# Patient Record
Sex: Female | Born: 1950
Health system: Southern US, Community
[De-identification: ages and names within clinical notes are randomized; demographics above are authoritative.]

## PROBLEM LIST (undated history)

## (undated) DIAGNOSIS — K2210 Ulcer of esophagus without bleeding: Secondary | ICD-10-CM

## (undated) DIAGNOSIS — M8580 Other specified disorders of bone density and structure, unspecified site: Secondary | ICD-10-CM

## (undated) DIAGNOSIS — Z8719 Personal history of other diseases of the digestive system: Secondary | ICD-10-CM

## (undated) DIAGNOSIS — K623 Rectal prolapse: Secondary | ICD-10-CM

## (undated) DIAGNOSIS — K2270 Barrett's esophagus without dysplasia: Secondary | ICD-10-CM

## (undated) DIAGNOSIS — K649 Unspecified hemorrhoids: Secondary | ICD-10-CM

## (undated) DIAGNOSIS — R112 Nausea with vomiting, unspecified: Secondary | ICD-10-CM

## (undated) DIAGNOSIS — K219 Gastro-esophageal reflux disease without esophagitis: Secondary | ICD-10-CM

## (undated) DIAGNOSIS — G4730 Sleep apnea, unspecified: Secondary | ICD-10-CM

## (undated) DIAGNOSIS — G4700 Insomnia, unspecified: Secondary | ICD-10-CM

## (undated) DIAGNOSIS — K635 Polyp of colon: Secondary | ICD-10-CM

## (undated) DIAGNOSIS — K648 Other hemorrhoids: Secondary | ICD-10-CM

## (undated) DIAGNOSIS — K2970 Gastritis, unspecified, without bleeding: Secondary | ICD-10-CM

## (undated) DIAGNOSIS — H269 Unspecified cataract: Secondary | ICD-10-CM

## (undated) DIAGNOSIS — Z9889 Other specified postprocedural states: Secondary | ICD-10-CM

## (undated) DIAGNOSIS — Z5189 Encounter for other specified aftercare: Secondary | ICD-10-CM

## (undated) DIAGNOSIS — Z87442 Personal history of urinary calculi: Secondary | ICD-10-CM

## (undated) HISTORY — PX: CATARACT EXTRACTION: SUR2

## (undated) HISTORY — PX: ESOPHAGOGASTRODUODENOSCOPY: SHX1529

## (undated) HISTORY — DX: Sleep apnea, unspecified: G47.30

## (undated) HISTORY — PX: COLONOSCOPY: SHX174

## (undated) HISTORY — DX: Unspecified cataract: H26.9

## (undated) HISTORY — DX: Barrett's esophagus without dysplasia: K22.70

## (undated) HISTORY — DX: Insomnia, unspecified: G47.00

## (undated) HISTORY — DX: Encounter for other specified aftercare: Z51.89

## (undated) HISTORY — DX: Rectal prolapse: K62.3

## (undated) HISTORY — DX: Ulcer of esophagus without bleeding: K22.10

## (undated) HISTORY — DX: Other specified disorders of bone density and structure, unspecified site: M85.80

## (undated) HISTORY — DX: Other hemorrhoids: K64.8

## (undated) HISTORY — DX: Gastritis, unspecified, without bleeding: K29.70

## (undated) HISTORY — PX: COLONOSCOPY W/ BIOPSIES: SHX1374

## (undated) HISTORY — PX: SKIN BIOPSY: SHX1

## (undated) HISTORY — DX: Polyp of colon: K63.5

## (undated) HISTORY — DX: Unspecified hemorrhoids: K64.9

---

## 1982-03-08 HISTORY — PX: ABDOMINAL HYSTERECTOMY: SHX81

## 1998-07-18 ENCOUNTER — Encounter: Payer: Self-pay | Admitting: Emergency Medicine

## 1998-07-18 ENCOUNTER — Emergency Department (HOSPITAL_COMMUNITY): Admission: EM | Admit: 1998-07-18 | Discharge: 1998-07-18 | Payer: Self-pay | Admitting: Emergency Medicine

## 1998-11-05 ENCOUNTER — Emergency Department (HOSPITAL_COMMUNITY): Admission: EM | Admit: 1998-11-05 | Discharge: 1998-11-05 | Payer: Self-pay | Admitting: Emergency Medicine

## 1998-11-06 ENCOUNTER — Encounter: Payer: Self-pay | Admitting: Emergency Medicine

## 1998-11-27 ENCOUNTER — Ambulatory Visit (HOSPITAL_COMMUNITY): Admission: RE | Admit: 1998-11-27 | Discharge: 1998-11-27 | Payer: Self-pay | Admitting: *Deleted

## 1999-02-22 ENCOUNTER — Emergency Department (HOSPITAL_COMMUNITY): Admission: EM | Admit: 1999-02-22 | Discharge: 1999-02-22 | Payer: Self-pay

## 1999-02-23 ENCOUNTER — Emergency Department (HOSPITAL_COMMUNITY): Admission: EM | Admit: 1999-02-23 | Discharge: 1999-02-23 | Payer: Self-pay | Admitting: Emergency Medicine

## 1999-02-23 ENCOUNTER — Encounter: Payer: Self-pay | Admitting: Emergency Medicine

## 1999-03-10 ENCOUNTER — Encounter: Payer: Self-pay | Admitting: *Deleted

## 1999-03-10 ENCOUNTER — Encounter: Admission: RE | Admit: 1999-03-10 | Discharge: 1999-03-10 | Payer: Self-pay | Admitting: *Deleted

## 1999-03-18 ENCOUNTER — Encounter: Admission: RE | Admit: 1999-03-18 | Discharge: 1999-03-18 | Payer: Self-pay | Admitting: *Deleted

## 1999-03-18 ENCOUNTER — Encounter: Payer: Self-pay | Admitting: *Deleted

## 1999-07-23 ENCOUNTER — Encounter: Payer: Self-pay | Admitting: Emergency Medicine

## 1999-07-23 ENCOUNTER — Emergency Department (HOSPITAL_COMMUNITY): Admission: EM | Admit: 1999-07-23 | Discharge: 1999-07-23 | Payer: Self-pay | Admitting: Emergency Medicine

## 1999-07-27 ENCOUNTER — Emergency Department (HOSPITAL_COMMUNITY): Admission: EM | Admit: 1999-07-27 | Discharge: 1999-07-27 | Payer: Self-pay | Admitting: Emergency Medicine

## 1999-09-22 ENCOUNTER — Encounter: Admission: RE | Admit: 1999-09-22 | Discharge: 1999-09-22 | Payer: Self-pay | Admitting: *Deleted

## 1999-09-22 ENCOUNTER — Encounter: Payer: Self-pay | Admitting: *Deleted

## 2000-02-02 ENCOUNTER — Emergency Department (HOSPITAL_COMMUNITY): Admission: EM | Admit: 2000-02-02 | Discharge: 2000-02-02 | Payer: Self-pay | Admitting: Emergency Medicine

## 2000-05-21 ENCOUNTER — Emergency Department (HOSPITAL_COMMUNITY): Admission: EM | Admit: 2000-05-21 | Discharge: 2000-05-21 | Payer: Self-pay | Admitting: Emergency Medicine

## 2000-12-21 ENCOUNTER — Emergency Department (HOSPITAL_COMMUNITY): Admission: EM | Admit: 2000-12-21 | Discharge: 2000-12-21 | Payer: Self-pay | Admitting: Emergency Medicine

## 2000-12-21 ENCOUNTER — Encounter: Payer: Self-pay | Admitting: Emergency Medicine

## 2002-02-27 ENCOUNTER — Encounter: Payer: Self-pay | Admitting: Internal Medicine

## 2002-02-27 ENCOUNTER — Encounter: Admission: RE | Admit: 2002-02-27 | Discharge: 2002-02-27 | Payer: Self-pay | Admitting: Internal Medicine

## 2002-06-26 ENCOUNTER — Emergency Department (HOSPITAL_COMMUNITY): Admission: EM | Admit: 2002-06-26 | Discharge: 2002-06-26 | Payer: Self-pay | Admitting: Emergency Medicine

## 2002-07-18 ENCOUNTER — Encounter (INDEPENDENT_AMBULATORY_CARE_PROVIDER_SITE_OTHER): Payer: Self-pay | Admitting: Specialist

## 2002-07-18 ENCOUNTER — Ambulatory Visit (HOSPITAL_COMMUNITY): Admission: RE | Admit: 2002-07-18 | Discharge: 2002-07-18 | Payer: Self-pay | Admitting: *Deleted

## 2002-07-20 ENCOUNTER — Encounter: Payer: Self-pay | Admitting: Internal Medicine

## 2002-07-20 ENCOUNTER — Encounter: Admission: RE | Admit: 2002-07-20 | Discharge: 2002-07-20 | Payer: Self-pay | Admitting: Internal Medicine

## 2002-11-16 ENCOUNTER — Encounter: Admission: RE | Admit: 2002-11-16 | Discharge: 2002-11-16 | Payer: Self-pay | Admitting: Internal Medicine

## 2002-11-16 ENCOUNTER — Encounter: Payer: Self-pay | Admitting: Internal Medicine

## 2003-08-16 ENCOUNTER — Encounter (INDEPENDENT_AMBULATORY_CARE_PROVIDER_SITE_OTHER): Payer: Self-pay | Admitting: Specialist

## 2003-08-16 ENCOUNTER — Ambulatory Visit (HOSPITAL_COMMUNITY): Admission: RE | Admit: 2003-08-16 | Discharge: 2003-08-16 | Payer: Self-pay | Admitting: *Deleted

## 2004-07-18 ENCOUNTER — Encounter: Admission: RE | Admit: 2004-07-18 | Discharge: 2004-07-18 | Payer: Self-pay | Admitting: Internal Medicine

## 2005-07-06 ENCOUNTER — Encounter (INDEPENDENT_AMBULATORY_CARE_PROVIDER_SITE_OTHER): Payer: Self-pay | Admitting: *Deleted

## 2005-07-06 ENCOUNTER — Ambulatory Visit (HOSPITAL_COMMUNITY): Admission: RE | Admit: 2005-07-06 | Discharge: 2005-07-06 | Payer: Self-pay | Admitting: *Deleted

## 2005-08-16 ENCOUNTER — Encounter: Admission: RE | Admit: 2005-08-16 | Discharge: 2005-08-16 | Payer: Self-pay | Admitting: Family Medicine

## 2005-08-18 ENCOUNTER — Ambulatory Visit: Payer: Self-pay | Admitting: Family Medicine

## 2005-08-24 ENCOUNTER — Encounter: Admission: RE | Admit: 2005-08-24 | Discharge: 2005-08-24 | Payer: Self-pay | Admitting: Family Medicine

## 2005-09-23 ENCOUNTER — Ambulatory Visit: Payer: Self-pay | Admitting: Family Medicine

## 2005-11-11 ENCOUNTER — Ambulatory Visit: Payer: Self-pay | Admitting: Family Medicine

## 2005-12-29 ENCOUNTER — Ambulatory Visit: Payer: Self-pay | Admitting: Family Medicine

## 2006-03-03 ENCOUNTER — Ambulatory Visit: Payer: Self-pay | Admitting: Family Medicine

## 2006-03-09 ENCOUNTER — Ambulatory Visit: Payer: Self-pay | Admitting: Family Medicine

## 2006-03-30 ENCOUNTER — Ambulatory Visit: Payer: Self-pay | Admitting: Family Medicine

## 2006-05-26 ENCOUNTER — Ambulatory Visit: Payer: Self-pay | Admitting: Family Medicine

## 2006-07-01 ENCOUNTER — Other Ambulatory Visit: Admission: RE | Admit: 2006-07-01 | Discharge: 2006-07-01 | Payer: Self-pay | Admitting: Family Medicine

## 2006-07-01 ENCOUNTER — Ambulatory Visit: Payer: Self-pay | Admitting: Family Medicine

## 2006-07-29 ENCOUNTER — Ambulatory Visit: Payer: Self-pay | Admitting: Family Medicine

## 2006-08-03 ENCOUNTER — Ambulatory Visit: Payer: Self-pay | Admitting: Family Medicine

## 2006-08-05 ENCOUNTER — Encounter: Admission: RE | Admit: 2006-08-05 | Discharge: 2006-08-05 | Payer: Self-pay | Admitting: Family Medicine

## 2006-08-19 ENCOUNTER — Encounter: Admission: RE | Admit: 2006-08-19 | Discharge: 2006-08-19 | Payer: Self-pay | Admitting: Family Medicine

## 2006-09-13 ENCOUNTER — Ambulatory Visit: Payer: Self-pay | Admitting: Family Medicine

## 2007-01-06 ENCOUNTER — Ambulatory Visit: Payer: Self-pay | Admitting: Family Medicine

## 2007-05-12 ENCOUNTER — Ambulatory Visit: Payer: Self-pay | Admitting: Family Medicine

## 2007-07-06 ENCOUNTER — Other Ambulatory Visit: Admission: RE | Admit: 2007-07-06 | Discharge: 2007-07-06 | Payer: Self-pay | Admitting: Family Medicine

## 2007-07-06 ENCOUNTER — Ambulatory Visit: Payer: Self-pay | Admitting: Family Medicine

## 2007-07-12 ENCOUNTER — Ambulatory Visit: Payer: Self-pay | Admitting: Family Medicine

## 2007-08-21 ENCOUNTER — Encounter: Admission: RE | Admit: 2007-08-21 | Discharge: 2007-08-21 | Payer: Self-pay | Admitting: Family Medicine

## 2007-11-27 ENCOUNTER — Ambulatory Visit: Payer: Self-pay | Admitting: Family Medicine

## 2008-01-26 ENCOUNTER — Ambulatory Visit: Payer: Self-pay | Admitting: Family Medicine

## 2008-02-27 ENCOUNTER — Ambulatory Visit: Payer: Self-pay | Admitting: Family Medicine

## 2008-03-08 HISTORY — PX: CHOLECYSTECTOMY: SHX55

## 2008-04-29 ENCOUNTER — Ambulatory Visit: Payer: Self-pay | Admitting: Family Medicine

## 2008-04-30 ENCOUNTER — Ambulatory Visit (HOSPITAL_COMMUNITY): Admission: RE | Admit: 2008-04-30 | Discharge: 2008-04-30 | Payer: Self-pay | Admitting: Family Medicine

## 2008-05-09 ENCOUNTER — Ambulatory Visit (HOSPITAL_COMMUNITY): Admission: RE | Admit: 2008-05-09 | Discharge: 2008-05-09 | Payer: Self-pay | Admitting: Surgery

## 2008-05-09 ENCOUNTER — Encounter (INDEPENDENT_AMBULATORY_CARE_PROVIDER_SITE_OTHER): Payer: Self-pay | Admitting: Surgery

## 2008-07-09 ENCOUNTER — Ambulatory Visit: Payer: Self-pay | Admitting: Family Medicine

## 2008-08-14 ENCOUNTER — Encounter (INDEPENDENT_AMBULATORY_CARE_PROVIDER_SITE_OTHER): Payer: Self-pay | Admitting: *Deleted

## 2008-08-14 ENCOUNTER — Ambulatory Visit (HOSPITAL_COMMUNITY): Admission: RE | Admit: 2008-08-14 | Discharge: 2008-08-14 | Payer: Self-pay | Admitting: *Deleted

## 2008-11-08 ENCOUNTER — Ambulatory Visit: Payer: Self-pay | Admitting: Family Medicine

## 2008-11-15 ENCOUNTER — Ambulatory Visit: Payer: Self-pay | Admitting: Family Medicine

## 2008-12-12 ENCOUNTER — Ambulatory Visit: Payer: Self-pay | Admitting: Family Medicine

## 2009-01-27 ENCOUNTER — Ambulatory Visit: Payer: Self-pay | Admitting: Family Medicine

## 2009-07-17 ENCOUNTER — Ambulatory Visit: Payer: Self-pay | Admitting: Physician Assistant

## 2009-07-21 ENCOUNTER — Encounter: Admission: RE | Admit: 2009-07-21 | Discharge: 2009-07-21 | Payer: Self-pay | Admitting: Family Medicine

## 2009-08-28 ENCOUNTER — Ambulatory Visit: Payer: Self-pay | Admitting: Family Medicine

## 2009-09-05 ENCOUNTER — Ambulatory Visit: Payer: Self-pay | Admitting: Family Medicine

## 2009-10-08 DIAGNOSIS — K2210 Ulcer of esophagus without bleeding: Secondary | ICD-10-CM

## 2009-10-08 HISTORY — DX: Ulcer of esophagus without bleeding: K22.10

## 2009-11-28 ENCOUNTER — Ambulatory Visit: Payer: Self-pay | Admitting: Family Medicine

## 2010-03-29 ENCOUNTER — Encounter: Payer: Self-pay | Admitting: Family Medicine

## 2010-04-13 ENCOUNTER — Ambulatory Visit (INDEPENDENT_AMBULATORY_CARE_PROVIDER_SITE_OTHER): Payer: BC Managed Care – PPO | Admitting: Family Medicine

## 2010-04-13 DIAGNOSIS — T148XXA Other injury of unspecified body region, initial encounter: Secondary | ICD-10-CM

## 2010-04-17 ENCOUNTER — Ambulatory Visit: Payer: BC Managed Care – PPO | Admitting: Family Medicine

## 2010-04-17 ENCOUNTER — Ambulatory Visit (INDEPENDENT_AMBULATORY_CARE_PROVIDER_SITE_OTHER): Payer: BC Managed Care – PPO | Admitting: Family Medicine

## 2010-04-17 DIAGNOSIS — N390 Urinary tract infection, site not specified: Secondary | ICD-10-CM

## 2010-04-17 DIAGNOSIS — H9209 Otalgia, unspecified ear: Secondary | ICD-10-CM

## 2010-06-18 LAB — HEMOGLOBIN AND HEMATOCRIT, BLOOD: HCT: 41.8 % (ref 36.0–46.0)

## 2010-06-22 ENCOUNTER — Other Ambulatory Visit: Payer: Self-pay | Admitting: Family Medicine

## 2010-06-22 DIAGNOSIS — Z1231 Encounter for screening mammogram for malignant neoplasm of breast: Secondary | ICD-10-CM

## 2010-07-06 ENCOUNTER — Encounter: Payer: Self-pay | Admitting: Family Medicine

## 2010-07-06 DIAGNOSIS — IMO0002 Reserved for concepts with insufficient information to code with codable children: Secondary | ICD-10-CM

## 2010-07-06 DIAGNOSIS — M546 Pain in thoracic spine: Secondary | ICD-10-CM

## 2010-07-08 ENCOUNTER — Ambulatory Visit (INDEPENDENT_AMBULATORY_CARE_PROVIDER_SITE_OTHER): Payer: BC Managed Care – PPO | Admitting: Medical

## 2010-07-08 DIAGNOSIS — J029 Acute pharyngitis, unspecified: Secondary | ICD-10-CM

## 2010-07-08 DIAGNOSIS — G4700 Insomnia, unspecified: Secondary | ICD-10-CM

## 2010-07-08 DIAGNOSIS — J069 Acute upper respiratory infection, unspecified: Secondary | ICD-10-CM

## 2010-07-21 NOTE — Op Note (Signed)
NAME:  Anna Mckinney, Anna Mckinney NO.:  [id number]   MEDICAL RECORD NO.:  [id number]          PATIENT TYPE:  AMB   LOCATION:  DAY                          FACILITY:  [facility]   PHYSICIAN:  Ardeth Sportsman, MD     DATE OF BIRTH:  07-20-1950   DATE OF PROCEDURE:  DATE OF DISCHARGE:                               OPERATIVE REPORT   PRIMARY CARE PHYSICIAN:  Sharlot Gowda, M.D.   GASTROENTEROLOGIST:  Georgiana Spinner, M.D.   SURGEON:  Ardeth Sportsman, MD.   ASSISTANT:  None.   PREOPERATIVE DIAGNOSES:  Biliary colic with probable cholecystitis.   POSTOPERATIVE DIAGNOSIS:  1. Cholecystolithiasis.  2. Chronic cholecystitis.   PROCEDURE PERFORMED:  Left upper cholecystectomy with intraoperative  cholangiogram.   ANESTHESIA:  1. General anesthesia.  2. Local anesthetic in a field block around port sites.   SPECIMENS:  Gallbladder.   DRAINS:  None.   ESTIMATED BLOOD LOSS:  Less than 10 mL.   COMPLICATIONS:  None apparent.   INDICATIONS:  [name] is a pleasant but anxious 60 year old female  with a lot of stress in her life taking care of her high-needs daughter  and family.  She has had a history of biliary colic.  We have seen her  in the past and recommended cholecystectomy, but she had delayed on this  by trying to do diet modification.  However, she is having worsening  symptoms.  Based on evaluation, she was known to have a known stone, had  a classic story for biliary colic.   The anatomy and physiology of hepatobiliary pancreatic function was  discussed.  The pathophysiology of cholecystolithiasis with its risks of  cholecystitis, gallstone pancreatitis, choledocholithiasis and other  natural history risks were discussed.  Options were discussed and  recommendation was made for a laparoscopic cholecystectomy with an  intraoperative cholangiogram and diagnostic laparoscopy.  The risks,  benefits, and alternatives were discussed.  Questions were answered.  She  agreed to proceed.   OPERATIVE FINDINGS:  She had some mild gallbladder wall thickening with  a few adhesions of omentum and duodenum consistent with chronic  cholecystitis.  She definitely had stones.   Her cholangiogram noted cystic cannulization and her right intrahepatic  biliary chains filled well but her left were rather on slow to fill but  ultimately did fill with a repeat cholangiogram.  There was no evidence  of any cirrhosis or other liver abnormalities.   DESCRIPTION OF PROCEDURE:  Informed consent was confirmed.  The patient  voided just prior to coming to the operating room.  She had sequential  compression devices active during the entire case.   She was a challenging intubation but ultimately with a guide, they were  able to intubate her without much incident.  Decadron was given for  nausea prophylaxis as well as airway edema prophylaxis.  She was supine  with both arms tucked.  Her abdomen was prepped and draped in a sterile  fashion.   A 5-mm port was placed in the right upper quadrant using optical entry  technique with the patient in steep  reverse Trendelenburg and right side  up.  Camera inspection revealed no intra-abdominal injury.  Under direct  visualization, a 5-mm port was placed through her prior infraumbilical  incision.  Another one was placed in the right flank.  A 10-mm port was  tunneled through the falciform ligament and subxiphoid region at an  angle.   The gallbladder fundus was grasped and elevated cephalad.  Omental and  duodenal adhesions were carefully swept away using primarily focused  cautery as well as some blunt dissection, especially around the  duodenum.  Peritoneal coverings between the liver and the anteromedial  and posterolateral walls of the gallbladder were done.  Circumferential  dissection was done around the base of the gallbladder to free the  proximal third of the gallbladder off the liver bed to get a good  classic  critical view.  Dissection was done to reveal 3 structures going  from the gallbladder fundus down to the porta hepatis.  One was  pulsatile, on the anteromedial wall, consistent with the dominant  anterior branch of the cystic artery.  One clip on the gallbladder side,  2 clips slightly proximal were made, and this was transected.  A  posterior branch was found about a third of the way up the  posterolateral wall and this was carefully skeletonized, isolated, and  clipped as well.   This left one structure going from the gallbladder down to the porta  hepatis, consistent with the cystic duct.  It was further skeletonized  to get some good length of several centimeters.  Two clips on the  infundibulum were placed.  A partial cystic ductotomy was performed.  Some sand was milked back up from the cystic duct and common bile duct  junction back up to the ductotomy.  A 5-French cholangiogram catheter  was placed through a right subcostal stab incision, flushed, and placed  in the cystic duct without any difficulty.   A cholangiogram was run using dilute radio-opaque contrast and  continuous fluoroscopy.  Contrast flowed well from a side branch in a  helical form consistent with cystic duct cannulization.  Contrast  refluxed well into the right intrahepatic chains, across the common  hepatic bile ducts, crossing normal ampulla into the duodenum without  any difficulty.  The left side was not seen very well.  I did a rerun a  cholangiogram, with some more aggressive flushing, and could get some  contrast to reflux up the left biliary radicles that seemed to be of  normal caliber.  The cholangiocatheter was removed.  Four clips were  placed in the cystic duct just proximal to the ductotomy.  Since I had a  decent length, cystic duct transection was completed right at the  infundibulum.   The gallbladder was freed from its remaining attachments on the liver  bed and removed out the subxiphoid  port.  During the removal, there was  some spillage of stones in the subxiphoid wound.  500 mL of irrigation  was done of the subxiphoid wound, with clear return.  The fascial defect  was reapproximated using a 0 Vicryl stitch in a figure-of-eight fashion  using a laparoscopic suture passer under direct visualization.   Hemostasis was excellent on the liver bed.  The clips were intact on the  cystic duct branch stumps and the cystic artery branch stumps.  Copious  irrigation was done with over 1.5 liters, with clear return at the end.  Capnoperitoneum was evacuated.  The ports were removed.  The  fascial  stitch was tied down, with good closure.  The skin was closed using 4-0  Monocryl stitch.   The patient was extubated and sent to the recovery room in stable  condition.   I had discussed postop care with the patient in detail in the office and  just prior to surgery.  I discussed it with her husband per her wishes  as well.      Ardeth Sportsman, MD  Electronically Signed     SCG/MEDQ  D:  05/09/2008  T:  05/09/2008  Job:  [id number]   cc:   Sharlot Gowda, M.D.  Fax: [phone]   Georgiana Spinner, M.D.  Fax: [phone]

## 2010-07-21 NOTE — Op Note (Signed)
NAME:  Anna Mckinney, Anna Mckinney               ACCOUNT NO.:  [id number]   MEDICAL RECORD NO.:  [id number]          PATIENT TYPE:  AMB   LOCATION:  ENDO                         FACILITY:  [facility]   PHYSICIAN:  Georgiana Spinner, M.D.    DATE OF BIRTH:  Aug 11, 1950   DATE OF PROCEDURE:  08/14/2008  DATE OF DISCHARGE:                               OPERATIVE REPORT   PROCEDURE:  Upper endoscopy with biopsy.   INDICATIONS:  Abdominal pain.   ANESTHESIA:  Fentanyl 50 mcg, Versed 5 mg.   PROCEDURE:  With the patient mildly sedated in the left lateral  decubitus position, the Pentax videoscopic endoscope was inserted into  the mouth, passed under direct vision through the esophagus, which  appeared normal until we reached the distal esophagus and there was an  ulcer seen near the squamocolumnar junction.  This was photographed and  biopsied.  We entered into the stomach.  Fundus, body, antrum, duodenal  bulb, second portion duodenum were visualized.  From this point the  endoscope was slowly withdrawn, taking circumferential views of duodenal  mucosa until the endoscope had been pulled back into stomach, placed in  retroflexion to view the stomach from below.  The endoscope was then  straightened and withdrawn, taking circumferential views of remaining  gastric and esophageal mucosa, stopping once again in the distal  esophagus where there was a change of a [address] of Barrett's, which  was photographed and biopsied as well.  The endoscope was withdrawn.  The patient's vital signs, pulse oximeter remained stable.  The patient  tolerated procedure well, without apparent complications.   FINDINGS:  Clinical cytogeneticist of Barrett's esophagus, biopsied, and of note,  ulcer in the distal esophagus, which would correspond with the patient's  having pain.  It gets better with eating and worse when she does not  eat.  I will start the patient on PPI.  I will have the patient call me  for results of biopsies and  follow up with me as an outpatient.           ______________________________  Georgiana Spinner, M.D.     GMO/MEDQ  D:  08/14/2008  T:  08/14/2008  Job:  [id number]

## 2010-07-24 NOTE — Op Note (Signed)
NAME:  Anna Mckinney, Anna Mckinney NO.:  [id number]   MEDICAL RECORD NO.:  [id number]                   PATIENT TYPE:  AMB   LOCATION:  ENDO                                 FACILITY:  [facility]   PHYSICIAN:  Georgiana Spinner, M.D.                 DATE OF BIRTH:  1950-10-20   DATE OF PROCEDURE:  08/16/2003  DATE OF DISCHARGE:                                 OPERATIVE REPORT   PROCEDURE:  Upper endoscopy with biopsy.   INDICATIONS:  Barrett's esophagus by biopsy in the past.   ANESTHESIA:  1. Demerol 90.  2. Versed 9 mg.   DESCRIPTION OF PROCEDURE:  With patient mildly sedated in the left lateral  decubitus position, the Olympus videoscopic endoscope was inserted in the  mouth, passed under direct vision through the esophagus, which appeared  normal until we reached the distal esophagus, and there were islands of  Barrett's tissue photographed and then subsequently biopsied.  We entered  into the stomach.  Fundus, body, antrum, duodenal bulb, second portion of  duodenum were well-visualized.  From this point, the endoscope was slowly  withdrawn, taking circumferential views of the duodenal mucosa until the  endoscope then pulled back into the stomach, placed in retroflexion to view  the stomach from below.  A very lax gastroesophageal junction was noted and  photographed from below.  The endoscope was then straightened, withdrawn,  taking circumferential views of the remaining gastric and esophageal mucosa.  The patient's vital signs and pulse oximeter remained stable.  The patient  tolerated the procedure well without apparent complications.   FINDINGS:  Barrett's esophagus above a lax gastroesophageal sphincter.   PLAN:  1. Await biopsy report.  2. The patient will call me for results and follow up with me as an     outpatient.                                               Georgiana Spinner, M.D.    GMO/MEDQ  D:  08/16/2003  T:  08/16/2003  Job:  [id number]   cc:    Allena Napoleon, MD

## 2010-07-24 NOTE — Op Note (Signed)
NAME:  Anna Mckinney, Anna Mckinney               ACCOUNT NO.:  [id number]   MEDICAL RECORD NO.:  [id number]          PATIENT TYPE:  AMB   LOCATION:  ENDO                         FACILITY:  [facility]   PHYSICIAN:  Georgiana Spinner, M.D.    DATE OF BIRTH:  [birth year]   DATE OF PROCEDURE:  07/06/2005  DATE OF DISCHARGE:                                 OPERATIVE REPORT   PROCEDURE:  Upper endoscopy.   INDICATIONS:  Gastroesophageal reflux disease with known Barrett's  esophagus.   ANESTHESIA:  Demerol 60 mg, Versed 8 mg.   PROCEDURE:  With the patient mildly sedated in the left lateral decubitus  position, the Olympus videoscopic endoscope was inserted in the mouth,  passed under direct vision through the esophagus which appeared normal until  we reached the distal esophagus, and there were changes of Barrett's  photographed and subsequently biopsied.  We entered into the stomach.  Fundus, body, antrum, duodenal bulb and second portion of duodenum all  appeared normal.  From this point, the endoscope was slowly withdrawn taking  circumferential views of duodenal mucosa until the endoscope had been pulled  back in the stomach and placed in retroflexion, viewing the stomach from  below. The endoscope was straightened and withdrawn, taking circumferential  views of the remaining gastric and esophageal mucosa.  The patient's vital  signs and pulse oximeter remained stable.  The patient tolerated the  procedure well without apparent complications.   FINDINGS:  Barrett's esophagus.  Await biopsy report.  The patient will call  me for results and follow-up with me as an outpatient.           ______________________________  Georgiana Spinner, M.D.     GMO/MEDQ  D:  07/06/2005  T:  07/06/2005  Job:  [id number]   cc:   Sharlot Gowda, M.D.  Fax: [phone]

## 2010-07-24 NOTE — Op Note (Signed)
   NAME:  Anna Mckinney, CARIS NO.:  [id number]   MEDICAL RECORD NO.:  [id number]                   PATIENT TYPE:  AMB   LOCATION:  ENDO                                 FACILITY:  [facility]   PHYSICIAN:  Georgiana Spinner, M.D.                 DATE OF BIRTH:  12-12-1950   DATE OF PROCEDURE:  07/18/2002  DATE OF DISCHARGE:                                 OPERATIVE REPORT   PROCEDURE:  Upper endoscopy.   INDICATIONS:  GERD and abdominal pain.   ANESTHESIA:  Demerol 50 mg, Versed 5 mg.   DESCRIPTION OF PROCEDURE:  With the patient mildly sedated in the left  lateral decubitus position, the Olympus videoscopic endoscope was inserted  through the mouth and passed under direct vision through the esophagus.  In  the esophagus there were small shallow lengths of tissue that could have  been Barrett's esophagus which we photographed and biopsied.  We entered the  stomach.  The fundus, body, antrum, duodenal bulb and second portion of the  duodenum all appeared normal.  From this point, the endoscope was slowly  withdrawn taking circumflex views along the mucosa until the endoscope was  then pulled back into the stomach, placed in retroflexion to view the  stomach from below.  Incomplete wrap of the GE junction was seen and  photographed.  The endoscope was then straightened and withdrawn.  The  patient's vital signs and pulse oximetry remained stable.  The patient  tolerated the procedure well without apparent complication.   FINDINGS:  1. Incomplete wrap of the GE junction around the endoscope indicating some     laxity of the gastroesophageal junction.  2. Also question of short segment Barrett's esophagus, biopsied.   PLAN:  Await biopsy report.  The patient will call me for results and follow  up with me as an outpatient and proceed to colonoscopy.                                                Georgiana Spinner, M.D.    GMO/MEDQ  D:  07/18/2002  T:  07/18/2002   Job:  [id number]

## 2010-07-24 NOTE — Op Note (Signed)
   NAME:  Anna, Mckinney NO.:  [id number]   MEDICAL RECORD NO.:  [id number]                   PATIENT TYPE:   LOCATION:                                       FACILITY:  [facility]   PHYSICIAN:  Georgiana Spinner, M.D.                 DATE OF BIRTH:  22-Apr-1950   DATE OF PROCEDURE:  DATE OF DISCHARGE:                                 OPERATIVE REPORT   PROCEDURE:  Colonoscopy.   INDICATIONS:  Rectal bleeding, colon cancer screening.   ANESTHESIA:  Demerol 30, Versed 3.   DESCRIPTION OF PROCEDURE:  With the patient mildly sedated and in the left  lateral decubitus position, the Olympus videoscopic colonoscope was inserted  in the rectum and passed under direct vision to the cecum, identified by  the ileocecal valve and appendiceal orifice both of which were photographed.  From this point, the colonoscope was slowly withdrawn taking circumferential  views of the entire colonic mucosa, stopping only in the rectum which  appeared normal in the direct and retroflexed view.  The endoscope was  straightened and withdrawn.  The patient's vital signs and pulse oximetry  remained stable.  The patient tolerated the procedure well without apparent  complications.   FINDINGS:  Normal colonoscopic examination to the cecum.                                               Georgiana Spinner, M.D.    GMO/MEDQ  D:  07/18/2002  T:  07/18/2002  Job:  [id number]

## 2010-07-27 ENCOUNTER — Encounter: Payer: Self-pay | Admitting: Family Medicine

## 2010-07-27 ENCOUNTER — Other Ambulatory Visit (HOSPITAL_COMMUNITY)
Admission: RE | Admit: 2010-07-27 | Discharge: 2010-07-27 | Disposition: A | Discharge status: Home / Self Care | Payer: BC Managed Care – PPO | Source: Ambulatory Visit | Attending: Family Medicine | Admitting: Family Medicine

## 2010-07-27 ENCOUNTER — Ambulatory Visit (INDEPENDENT_AMBULATORY_CARE_PROVIDER_SITE_OTHER): Payer: BC Managed Care – PPO | Admitting: Family Medicine

## 2010-07-27 VITALS — BP 132/88 | HR 72 | Ht 64.0 in | Wt 152.0 lb

## 2010-07-27 DIAGNOSIS — J069 Acute upper respiratory infection, unspecified: Secondary | ICD-10-CM

## 2010-07-27 DIAGNOSIS — M899 Disorder of bone, unspecified: Secondary | ICD-10-CM

## 2010-07-27 DIAGNOSIS — M8580 Other specified disorders of bone density and structure, unspecified site: Secondary | ICD-10-CM | POA: Insufficient documentation

## 2010-07-27 DIAGNOSIS — E7800 Pure hypercholesterolemia, unspecified: Secondary | ICD-10-CM

## 2010-07-27 DIAGNOSIS — Z0000 Encounter for general adult medical examination without abnormal findings: Secondary | ICD-10-CM

## 2010-07-27 DIAGNOSIS — Z01419 Encounter for gynecological examination (general) (routine) without abnormal findings: Secondary | ICD-10-CM | POA: Insufficient documentation

## 2010-07-27 LAB — POCT URINALYSIS DIPSTICK
Protein, UA: NEGATIVE
pH, UA: 5

## 2010-07-27 LAB — COMPREHENSIVE METABOLIC PANEL
ALT: 20 U/L (ref 0–35)
Alkaline Phosphatase: 79 U/L (ref 39–117)
BUN: 14 mg/dL (ref 6–23)
CO2: 24 mEq/L (ref 19–32)
Calcium: 9.8 mg/dL (ref 8.4–10.5)
Sodium: 142 mEq/L (ref 135–145)

## 2010-07-27 LAB — LIPID PANEL
HDL: 33 mg/dL — ABNORMAL LOW (ref >39)
LDL Cholesterol: 144 mg/dL — ABNORMAL HIGH (ref 0–99)
Total CHOL/HDL Ratio: 6.9 Ratio
VLDL: 52 mg/dL — ABNORMAL HIGH (ref 0–40)

## 2010-07-27 LAB — HM PAP SMEAR: HM Pap smear: NORMAL

## 2010-07-27 NOTE — Patient Instructions (Signed)
For your upper respiratory symptoms--use loratidine daily, decongestants (such as Sudafed) and Mucinex.  Sinus rinses or Neti pot.  F/u if symptoms getting worse.  Increase your calcium intake to a total of 1500 mg daily

## 2010-07-27 NOTE — Progress Notes (Signed)
Subjective:    Patient ID: Anna Mckinney, female    DOB: Aug 21, 1950, 60 y.o.   MRN: [id number]  HPI Anna Mckinney is a 60 y.o. female who presents for a complete physical.  She has the following concerns: Cold symptoms.  Saw Shane 07/08/2010 with URI symptoms, then went to [facility] over the weekend and was rx'd a Z-pak.  Symptoms resolved, but recurred 2 days ago.  Complaining of cough, scratchy throat, congested.  Mucus from nose is clear.  Some cough, dry, no phlegm.  Denies fevers, "feels yucky".  Some diarrhea yesterday, took Imodium.  Took loratidine yesterday, nothing today.  Patient with h/o elevated cholesterol.  Was put on Lipitor in the past  but it made her sick (vomiting) and achey.  Also has tried Crestor which made her thighs ache (like someone beat her with a hammer).  Has taken Red Yeast Rice in the past, but never had labs to determine if it lowered cholesterol sufficiently.  Hasn't been taking it regularly recently.   Immunization History  Administered Date(s) Administered  . DTaP 07/09/2005  . Influenza Whole 01/06/2007, 11/27/2007, 11/27/2009  . Pneumococcal Conjugate 07/09/2005   Last Pap smear: 2009 Last mammogram: June 2011, scheduled for 2012 Last colonoscopy: 3-4 years ago, per pt (Dr. Virginia Rochester) Last DEXA: at least 2 years ago Dentist: regular, every 6 months Eye doctor: recent exam Exercise: Zumba 3x/week  Past Medical History  Diagnosis Date  . Barrett's esophagus     last EGD 2011 [facility]  . External hemorrhoids 2012    getting banding every other week with Dr. Kinnie Scales   . Osteopenia   . Impaired fasting glucose   . Increased serum lipids   . Hyperlipidemia     Past Surgical History  Procedure Date  . Cholecystectomy 2010  . Abdominal hysterectomy 1984    1 ovary remains; removed for "cancer cells"    History   Social History  . Marital Status: Married    Spouse Name: N/A    Number of Children: N/A  . Years of Education: N/A    Occupational History  . 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 Smoking status: Former Smoker    Quit date: 01/24/2009  . Smokeless tobacco: Never Used  . Alcohol Use: No  . Drug Use: No  . Sexually Active: Not on file   Other Topics Concern  . Not on file   Social History Narrative  . No narrative on file    Family History  Problem Relation Age of Onset  . Cancer Mother 63    breast cancer  . COPD Father   . Heart disease Father     CHF  . Atrial fibrillation Father   . Angelman syndrome Daughter   . Seizures Daughter   . Cancer Paternal Grandmother     stomach  . Thyroid disease Sister     Current outpatient prescriptions:Ascorbic Acid (VITAMIN C) 100 MG tablet, Take 100 mg by mouth daily.  , Disp: , Rfl: ;  Calcium Carbonate-Vitamin D (CALCIUM + D PO), Take by mouth 2 (two) times daily.  , Disp: , Rfl: ;  cholecalciferol (VITAMIN D) 1000 UNITS tablet, Take 1,000 Units by mouth daily.  , Disp: , Rfl: ;  Coenzyme Q10 (CO Q 10) 10 MG CAPS, Take by mouth.  , Disp: , Rfl: ;  Flaxseed, Linseed, OIL, Take 11 each by mouth.  , Disp: , Rfl:  Probiotic  Product (SOLUBLE FIBER/PROBIOTICS PO), Take by mouth.  , Disp: , Rfl: ;  nizatidine (AXID) 150 MG capsule, Take 150 mg by mouth at bedtime.  , Disp: , Rfl: ;  DISCONTD: Cholecalciferol ([employer] VIT D 5000 HIGH-POTENCY PO), Take by mouth. , Disp: , Rfl:   Allergies  Allergen Reactions  . Codeine   . Vicodin (Hydrocodone-Acetaminophen) Itching and Nausea And Vomiting   Review of Systems The patient denies anorexia, fever, weight changes, headaches,  vision changes, decreased hearing, ear pain, breast concerns, chest pain, palpitations, dizziness, syncope, dyspnea on exertion, swelling, nausea, vomiting, constipation, abdominal pain, melena, hematochezia, indigestion/heartburn, hematuria, incontinence, dysuria, no vaginal bleeding, vaginal discharge, odor or itch, genital lesions, joint pains, numbness, tingling,  weakness, tremor, suspicious skin lesions, depression, anxiety, abnormal bleeding/bruising, or enlarged lymph nodes.  +ROS:  See above for URI symptoms.  Decreased libido and vaginal dryness    Objective:   Physical Exam BP 132/88  Pulse 72  Ht 5\' 4"  (1.626 m)  Wt 152 lb (68.947 kg)  BMI 26.09 kg/m2  General Appearance:    Alert, cooperative, no distress, appears stated age  Head:    Normocephalic, without obvious abnormality, atraumatic  Eyes:    PERRL, conjunctiva/corneas clear, EOM's intact, fundi    benign  Ears:    Normal TM's and external ear canals  Nose:   Nares normal, mucosa mildly edematous, no erythema or purulence.  mild sinus tenderness x 4  Throat:   Lips, mucosa, and tongue normal; teeth and gums normal  Neck:   Supple, no lymphadenopathy;  thyroid:  no   enlargement/tenderness/nodules; no carotid   bruit or JVD  Back:    Spine nontender, no curvature, ROM normal, no CVA     tenderness  Lungs:     Clear to auscultation bilaterally without wheezes, rales or     ronchi; respirations unlabored  Chest Wall:    No tenderness or deformity   Heart:    Regular rate and rhythm, S1 and S2 normal, no murmur, rub   or gallop  Breast Exam:    No tenderness, masses, or nipple discharge or inversion.      No axillary lymphadenopathy  Abdomen:     Soft, non-tender, nondistended, normoactive bowel sounds,    no masses, no hepatosplenomegaly  Genitalia:    Normal external genitalia without lesions. Some healing areas of recent follicular inflammation bilaterally. BUS and vagina normal; cervix is absent No abnormal vaginal discharge.  Uterus is absent.  No adnexal masses.  Pap performed  Rectal:    Normal tone, no masses or tenderness; guaiac negative stool, large external hemorrhoids  Extremities:   No clubbing, cyanosis or edema  Pulses:   2+ and symmetric all extremities  Skin:   Skin color, texture, turgor normal, no rashes or lesions  Lymph nodes:   Cervical, supraclavicular, and  axillary nodes normal  Neurologic:   CNII-XII intact, normal strength, sensation and gait; reflexes 2+ and symmetric throughout          Psych:   Normal mood, affect, hygiene and grooming.          Assessment & Plan:   1. Physical exam, annual  POCT urinalysis dipstick, Cytology - PAP, Comprehensive metabolic panel  2. Pure hypercholesterolemia  Comprehensive metabolic panel, Lipid panel  3. Osteopenia  Vitamin D 25 hydroxy  4. Acute upper respiratory infections of unspecified site     vs allergies.  Most likely viral given loose stools yesterday   Lipids--will advise  of results when available.  Choices will be to restart RYR but make sure to follow up with repeat lipids in 2 months, vs changing med to [facility] or [facility]  URI--discussed Loratidine, decongestants prn and Mucinex.  Sinus rinses or Neti pot.  F/u if symptoms getting worse   Discussed monthly self breast exams and yearly mammograms after the age of 27; at least 30 minutes of aerobic activity at least 5 days/week; proper sunscreen use reviewed; healthy diet, including goals of calcium and vitamin D intake and alcohol recommendations (less than or equal to 1 drink/day) reviewed; regular seatbelt use; changing batteries in smoke detectors.  Immunization recommendations discussed.  Colonoscopy recommendations reviewed

## 2010-07-28 ENCOUNTER — Encounter: Payer: Self-pay | Admitting: Physician Assistant

## 2010-07-29 ENCOUNTER — Telehealth: Payer: Self-pay | Admitting: *Deleted

## 2010-07-29 NOTE — Telephone Encounter (Signed)
Patient notified of PAP results, normal. Also notified pt of labs. Patient opted for red yeast rice instead of starting simvastatin. She will sch appt for lipid/liver in 3 months, could not commit at this time as she needed to check sch. Also mailed chol diet info for her and her daughter.

## 2010-08-06 ENCOUNTER — Ambulatory Visit
Admission: RE | Admit: 2010-08-06 | Discharge: 2010-08-06 | Disposition: A | Discharge status: Home / Self Care | Payer: BC Managed Care – PPO | Source: Ambulatory Visit | Attending: Family Medicine | Admitting: Family Medicine

## 2010-08-06 DIAGNOSIS — Z1231 Encounter for screening mammogram for malignant neoplasm of breast: Secondary | ICD-10-CM

## 2010-08-06 DIAGNOSIS — M8580 Other specified disorders of bone density and structure, unspecified site: Secondary | ICD-10-CM

## 2010-08-06 LAB — HM DEXA SCAN

## 2010-08-11 ENCOUNTER — Encounter: Payer: Self-pay | Admitting: Family Medicine

## 2010-08-13 ENCOUNTER — Telehealth: Payer: Self-pay | Admitting: Family Medicine

## 2010-08-13 NOTE — Telephone Encounter (Signed)
Advise mammo results from April were normal (she should get letter directly from imaging center for mammo results).  Bone density--letter was written 6/5.  Ensure that it was sent.  You can advise pt over phone of info in letter.  Thanks

## 2010-08-14 ENCOUNTER — Telehealth: Payer: Self-pay

## 2010-08-14 NOTE — Telephone Encounter (Signed)
Left message letter was sent and to take otc cal and vit d

## 2010-10-29 ENCOUNTER — Encounter: Payer: Self-pay | Admitting: Family Medicine

## 2010-10-29 ENCOUNTER — Ambulatory Visit (INDEPENDENT_AMBULATORY_CARE_PROVIDER_SITE_OTHER): Payer: BC Managed Care – PPO | Admitting: Family Medicine

## 2010-10-29 VITALS — BP 126/82 | HR 63 | Temp 97.7°F | Wt 154.0 lb

## 2010-10-29 DIAGNOSIS — J029 Acute pharyngitis, unspecified: Secondary | ICD-10-CM

## 2010-10-29 DIAGNOSIS — R350 Frequency of micturition: Secondary | ICD-10-CM

## 2010-10-29 LAB — POCT URINALYSIS DIPSTICK
Bilirubin, UA: NEGATIVE
Blood, UA: NEGATIVE
Ketones, UA: NEGATIVE
Leukocytes, UA: NEGATIVE
Nitrite, UA: NEGATIVE
Protein, UA: NEGATIVE
Urobilinogen, UA: NEGATIVE
pH, UA: 7

## 2010-10-29 NOTE — Progress Notes (Signed)
  Subjective:    Patient ID: Anna Mckinney, female    DOB: 1951-01-26, 60 y.o.   MRN: [id number]  HPI She has a two-day history of sore throat and malaise, dizziness as well as intermittent right earache. She also complains of frequency but no fever, chills or dysuria   Review of Systems     Objective:   Physical Exam alert and in no distress. Tympanic membranes and canals are normal. Throat is clear. Tonsils are normal. Neck is supple without adenopathy or thyromegaly. Cardiac exam shows a regular sinus rhythm without murmurs or gallops. Lungs are clear to auscultation. Strep screen is negative. Urine dipstick is negative       Assessment & Plan:  Pharyngitis. Frequency. Supportive care. Return here if further difficulties.

## 2010-11-23 DIAGNOSIS — K635 Polyp of colon: Secondary | ICD-10-CM

## 2010-11-23 HISTORY — DX: Polyp of colon: K63.5

## 2011-01-12 ENCOUNTER — Other Ambulatory Visit (INDEPENDENT_AMBULATORY_CARE_PROVIDER_SITE_OTHER): Payer: BC Managed Care – PPO

## 2011-01-12 DIAGNOSIS — Z23 Encounter for immunization: Secondary | ICD-10-CM

## 2011-02-24 ENCOUNTER — Encounter: Payer: Self-pay | Admitting: Medical

## 2011-02-24 ENCOUNTER — Ambulatory Visit (INDEPENDENT_AMBULATORY_CARE_PROVIDER_SITE_OTHER): Payer: BC Managed Care – PPO | Admitting: Medical

## 2011-02-24 VITALS — BP 130/80 | HR 68 | Temp 98.4°F | Resp 14 | Wt 156.0 lb

## 2011-02-24 DIAGNOSIS — J40 Bronchitis, not specified as acute or chronic: Secondary | ICD-10-CM

## 2011-02-24 DIAGNOSIS — R059 Cough, unspecified: Secondary | ICD-10-CM | POA: Insufficient documentation

## 2011-02-24 DIAGNOSIS — R05 Cough: Secondary | ICD-10-CM

## 2011-02-24 DIAGNOSIS — R0602 Shortness of breath: Secondary | ICD-10-CM

## 2011-02-24 MED ORDER — METHYLPREDNISOLONE 4 MG PO KIT: PACK | ORAL | Status: AC

## 2011-02-24 NOTE — Progress Notes (Signed)
Subjective:   HPI  Anna Mckinney is a 60 y.o. female who presents with c/o cough.  She reports going to [facility] on 02/04/11, diagnosed with pneumonia.  Was put on Levaquin for 10 days.  She notes that she feels better, but still has bad cough, fatigue, feels drained.  No energy.  Using Tylenol, tessalon Perles.  Chest feels tight, some SOB.   No other aggravating or relieving factors.    No other c/o.  The following portions of the patient's history were reviewed and updated as appropriate: allergies, current medications, past family history, past medical history, past social history, past surgical history and problem list.  Past Medical History  Diagnosis Date  . Barrett's esophagus     last EGD 2011 in [facility]  . External hemorrhoids 2012    getting banding every other week with Dr. Kinnie Cameran Pettey   . Osteopenia   . Impaired fasting glucose   . Increased serum lipids   . Hyperlipidemia    Review of Systems Constitutional: -fever, -chills, -sweats, -unexpected -weight change,-fatigue ENT: -runny nose, +ear pain, +sore throat Cardiology:  +chest pain, -palpitations, -edema Respiratory: +cough, -shortness of breath, -wheezing Gastroenterology: -abdominal pain, -nausea, -vomiting, -diarrhea, -constipation Hematology: -bleeding or bruising problems Musculoskeletal: -arthralgias, -myalgias, -joint swelling, -back pain Ophthalmology: -vision changes Urology: -dysuria, -difficulty urinating, -hematuria, -urinary frequency, -urgency Neurology: -headache, -weakness, -tingling, -numbness   Objective:   Filed Vitals:   02/24/11 1609  BP: 130/80  Pulse: 68  Temp: 98.4 F (36.9 C)  Resp: 14    General appearance: Alert, WD/WN, no distress, ill appearing                             Skin: warm, no rash, no diaphoresis                           Head: no sinus tenderness                            Eyes: conjunctiva normal, corneas clear, PERRLA                            Ears: pearly  TMs, external ear canals normal                          Nose: septum midline, turbinates swollen, with erythema and clear discharge             Mouth/throat: MMM, tongue normal, mild pharyngeal erythema                           Neck: supple, no adenopathy, no thyromegaly, nontender                          Heart: RRR, normal S1, S2, no murmurs                         Lungs: +bronchial breath sounds, +scattered rhonchi, no wheezes, no rales                Extremities: no edema, nontender     Assessment and Plan:   Encounter Diagnoses  Name Primary?  . Bronchitis Yes  . Cough   .  Shortness of breath    CXR with no acute changes, no obvious pneumonia, cardiac silhouette normal.    Prescription given today for Medrol Dosepak as below. C/t tessalon Perles.  Discussed diagnosis and treatment of bronchitis.  Suggested symptomatic OTC remedies for cough and congestion.  Tylenol or Ibuprofen OTC for fever and malaise.  Call/return in 2-3 days if symptoms are worse or not improving.  Advised that cough may linger even after the infection is improved.

## 2011-02-26 ENCOUNTER — Telehealth: Payer: Self-pay | Admitting: Medical

## 2011-02-26 NOTE — Telephone Encounter (Signed)
We discussed her symptoms, cough, and she denies any new fever or productive sputum.  She just began Medrol dose pack yesterday.   She will continue Medrol dose pack and call back Monday.

## 2011-03-02 ENCOUNTER — Ambulatory Visit (INDEPENDENT_AMBULATORY_CARE_PROVIDER_SITE_OTHER): Payer: BC Managed Care – PPO

## 2011-03-02 DIAGNOSIS — J40 Bronchitis, not specified as acute or chronic: Secondary | ICD-10-CM

## 2011-03-02 DIAGNOSIS — J9801 Acute bronchospasm: Secondary | ICD-10-CM

## 2011-03-02 DIAGNOSIS — J158 Pneumonia due to other specified bacteria: Secondary | ICD-10-CM

## 2011-03-12 ENCOUNTER — Telehealth: Payer: Self-pay | Admitting: Family Medicine

## 2011-03-12 NOTE — Telephone Encounter (Signed)
Message copied by Janeice Robinson on Fri Mar 12, 2011  3:04 PM ------      Message from: [place], DAVID S      Created: Fri Mar 12, 2011  7:35 AM       i had called out additional antibiotic over the holiday.  Call and see how she is doing now?  Did she get what I gave her or did she end up going to Urgent Care or to the ED?

## 2011-03-12 NOTE — Telephone Encounter (Signed)
PATIENT STATES THAT SHE DIDN'T PICK UP THE ANTIBIOTIC. SHE STATES THAT SHE WENT TO THE URGENT CARE. SHE SAID THEY SAID SHE HAS PNEUMONIA. THEY GAVE HER ANTIBIOTIC, STEROID AND A INHALER. SHE SAID SHE FEELS A LITTLE BETTER. SHE IS STILL COUGHING. SHE SAID SHE UNDERSTOOD THAT YOU COULDN'T SEE HER THAT DAY. CLS

## 2011-03-19 ENCOUNTER — Ambulatory Visit (INDEPENDENT_AMBULATORY_CARE_PROVIDER_SITE_OTHER): Payer: BC Managed Care – PPO | Admitting: Medical

## 2011-03-19 ENCOUNTER — Encounter: Payer: Self-pay | Admitting: Medical

## 2011-03-19 VITALS — BP 110/70 | HR 72 | Temp 98.2°F | Resp 16 | Wt 154.0 lb

## 2011-03-19 DIAGNOSIS — R0602 Shortness of breath: Secondary | ICD-10-CM

## 2011-03-19 DIAGNOSIS — R5383 Other fatigue: Secondary | ICD-10-CM

## 2011-03-19 DIAGNOSIS — R05 Cough: Secondary | ICD-10-CM

## 2011-03-19 MED ORDER — METHYLPREDNISOLONE ACETATE 40 MG/ML IJ SUSP
80.0000 mg | Freq: Once | INTRAMUSCULAR | Status: AC
  Administered 2011-03-19: 80 mg via INTRAMUSCULAR

## 2011-03-19 MED ORDER — BENZONATATE 200 MG PO CAPS: 200.0000 mg | ORAL_CAPSULE | Freq: Two times a day (BID) | ORAL | Status: AC | PRN

## 2011-03-19 NOTE — Progress Notes (Signed)
Subjective: Here for recheck on cough.  I saw her recently for same.  She was seen by Urgent Care around Christmas, originally put on Amoxicillin then returned, was found to have pneumonia on CXR, then placed on Levaquin.  Came to see me after finishing Levaquin due to persistent bad cough.  I placed her on Medrol dose pack and continued some cough medication.  She ended up calling back with c/o ongoing cough, and ultimately went back to Urgent Care.  She was placed on Dulera inhaler, given prednisone taper, and Zpak.  She still c/o bad cough that is persistent.  Of note, she had repeat CXR 02/26/11 that was clear.  Still using Robitussin.  She quit smoking 2 years ago, but started smoking in her 52s.  ROS:  Gen: no fever, chills, sweats, wt change, +fatigue Skin: no rash HEENT: no runny nose, sore throat, ear pain, sinus pressure Heart: no CP, +palpitations at times, no edema Lungs: +SOB, no wheezing GI: +nausea, no vomiting or diarrhea or abdominal pain    Past Medical History  Diagnosis Date  . Barrett's esophagus     last EGD 2011 in [facility]  . External hemorrhoids 2012    getting banding every other week with Dr. Kinnie Scales   . Osteopenia   . Impaired fasting glucose   . Increased serum lipids   . Hyperlipidemia      Objective:   Physical Exam  Filed Vitals:   03/19/11 0827  BP: 110/70  Pulse: 72  Temp: 98.2 F (36.8 C)  Resp: 16    General appearance: alert, no distress, WD/WN, white female, coughing HEENT: normocephalic, sclerae anicteric, TMs pearly, nares patent, no discharge or erythema, pharynx normal Oral cavity: MMM, no lesions Neck: supple, no lymphadenopathy, no thyromegaly, no masses Heart: RRR, normal S1, S2, no murmurs Lungs: decreased breath sounds, but no wheezes, rhonchi, or rales Abdomen: +bs, soft, non tender, non distended, no masses, no hepatomegaly, no splenomegaly Pulses: 2+ symmetric, upper and lower extremities, normal cap refill Ext: no edema,  cyanosis   Adult ECG Report  Indication: SOB  Rate: 67 bpm  Rhythm: normal sinus rhythm  QRS Axis: 33 degrees  PR Interval:  QRS Duration: 86ms  QTc:  Conduction Disturbances: none  Other Abnormalities: poor R wave progression  Patient's cardiac risk factors are: impaired fasting glucose, hyperlipidemia, former smoker long term.  EKG comparison:07/2008 EKG, unchanged  Narrative Interpretation: poor R wave progression, otherwise unchanged negative EKG    Assessment and Plan :    Encounter Diagnoses  Name Primary?  . Cough Yes  . Shortness of breath   . Fatigue    Discussed possible etiologies.  I suspect she has inflammation and cough lingering from recent pneumonia.   EKG today unchanged from prior.  Advised Zyrtec QHS, begin samples of Prilosec each morning before breakfast, gave script for Tessalon Perles, advised good hydration and rest, c/t Dulera, and IM Depo Medrol 80mg  today.  If not improving in 1 wk, consider PFTs and other workup.

## 2011-03-19 NOTE — Patient Instructions (Addendum)
Begin OTC Zyrtec 10mg  daily at bedtime.   Begin Prilosec samples 1 tablet 30-45 minutes prior to breakfast.  Do this daily until you finish the samples.  You can use Neti Pot nasal saline for nasal congestion.  Drink plenty of water, avoid foods that cause reflux such as acidic foods, tomato based foods, spicy foods, peppers, lots of caffeine.   Continue the [facility] inhaler twice daily. Make sure you rinse your mouth out with water after using the dulera inhaler.    You can use Tessalon Perles up to 3 times daily for cough.  We gave a steroid shot in office today.  Call back in 2 weeks to let me know if cough still lingering.

## 2011-03-20 ENCOUNTER — Encounter: Payer: Self-pay | Admitting: Medical

## 2011-03-20 DIAGNOSIS — R5383 Other fatigue: Secondary | ICD-10-CM | POA: Insufficient documentation

## 2011-03-20 LAB — CBC WITH DIFFERENTIAL/PLATELET
Basophils Absolute: 0 10*3/uL (ref 0.0–0.1)
Basophils Relative: 0 % (ref 0–1)
MCHC: 33.1 g/dL (ref 30.0–36.0)
Neutro Abs: 3.1 10*3/uL (ref 1.7–7.7)
Neutrophils Relative %: 48 % (ref 43–77)
Platelets: 264 10*3/uL (ref 150–400)
RDW: 14.1 % (ref 11.5–15.5)

## 2011-03-20 LAB — BASIC METABOLIC PANEL
BUN: 10 mg/dL (ref 6–23)
Potassium: 3.8 mEq/L (ref 3.5–5.3)
Sodium: 139 mEq/L (ref 135–145)

## 2011-03-20 MED ORDER — OMEPRAZOLE 40 MG PO CPDR: 40.0000 mg | DELAYED_RELEASE_CAPSULE | Freq: Every day | ORAL | Status: DC

## 2011-07-29 ENCOUNTER — Encounter: Payer: Self-pay | Admitting: Family Medicine

## 2011-07-29 ENCOUNTER — Encounter: Payer: Self-pay | Admitting: Internal Medicine

## 2011-07-29 ENCOUNTER — Ambulatory Visit (INDEPENDENT_AMBULATORY_CARE_PROVIDER_SITE_OTHER): Payer: BC Managed Care – PPO | Admitting: Family Medicine

## 2011-07-29 VITALS — BP 138/90 | HR 60 | Ht 64.0 in | Wt 153.0 lb

## 2011-07-29 DIAGNOSIS — L659 Nonscarring hair loss, unspecified: Secondary | ICD-10-CM

## 2011-07-29 DIAGNOSIS — M949 Disorder of cartilage, unspecified: Secondary | ICD-10-CM

## 2011-07-29 DIAGNOSIS — Z0000 Encounter for general adult medical examination without abnormal findings: Secondary | ICD-10-CM

## 2011-07-29 DIAGNOSIS — Z23 Encounter for immunization: Secondary | ICD-10-CM

## 2011-07-29 DIAGNOSIS — M8580 Other specified disorders of bone density and structure, unspecified site: Secondary | ICD-10-CM

## 2011-07-29 DIAGNOSIS — E7800 Pure hypercholesterolemia, unspecified: Secondary | ICD-10-CM

## 2011-07-29 LAB — LIPID PANEL
Cholesterol: 209 mg/dL — ABNORMAL HIGH (ref 0–200)
LDL Cholesterol: 131 mg/dL — ABNORMAL HIGH (ref 0–99)
Triglycerides: 217 mg/dL — ABNORMAL HIGH (ref <150)
VLDL: 43 mg/dL — ABNORMAL HIGH (ref 0–40)

## 2011-07-29 LAB — POCT URINALYSIS DIPSTICK
Bilirubin, UA: NEGATIVE
Glucose, UA: NEGATIVE
Ketones, UA: NEGATIVE
Leukocytes, UA: NEGATIVE
Protein, UA: NEGATIVE

## 2011-07-29 LAB — COMPREHENSIVE METABOLIC PANEL
ALT: 22 U/L (ref 0–35)
Albumin: 4.7 g/dL (ref 3.5–5.2)
Alkaline Phosphatase: 62 U/L (ref 39–117)
CO2: 26 mEq/L (ref 19–32)
Glucose, Bld: 84 mg/dL (ref 70–99)
Potassium: 4.1 mEq/L (ref 3.5–5.3)
Sodium: 141 mEq/L (ref 135–145)
Total Bilirubin: 0.7 mg/dL (ref 0.3–1.2)
Total Protein: 7.1 g/dL (ref 6.0–8.3)

## 2011-07-29 NOTE — Progress Notes (Signed)
Chief Complaint  Patient presents with  . Annual Exam    physical and needs refill on AXID   HPI:   Anna Mckinney is a 61 y.o. female who presents for a complete physical.  She has the following concerns:  Complains of some decreased libido.  Previously seen by [facility], and treated with hormones.  Elected not to go back due to some legal actions against the clinic.  Previously had worsening vaginal dryness, somewhat improved now.  Hyperlipidemia--started back on red yeast rice and coenzyme Q10 after lipids last year, but admits to slacking off.  Hasn't taken it for about 3 months.  Trying to follow a low cholesterol diet, lowfat diet.  Taking flax seed oil.    Some hair loss noted recently. +tanning bed use  Health Maintenance: Immunization History  Administered Date(s) Administered  . DTaP 07/09/2005  . Influenza Split 01/12/2011  . Influenza Whole 01/06/2007, 11/27/2007, 11/27/2009  . Pneumococcal Conjugate 07/09/2005   Last pap 2009, s/p hysterectomy Mammogram 08/2010 Colonoscopy:  Within the last year at [facility]; had polyps per pt EGD UTD per pt--had ulcer and f/u after ulcer Exercise--unable to get to Zumba.  Dances with daughter at home. DEXA 07/2010 (osteopenia)  Past Medical History  Diagnosis Date  . Barrett's esophagus     last EGD 2011 in [facility]  . External hemorrhoids 2012    getting banding every other week with Dr. Kinnie Scales   . Osteopenia   . Impaired fasting glucose   . Increased serum lipids   . Hyperlipidemia     Past Surgical History  Procedure Date  . Cholecystectomy 2010  . Abdominal hysterectomy 1984    1 ovary remains; removed for "cancer cells"    History   Social History  . Marital Status: Married    Spouse Name: N/A    Number of Children: N/A  . Years of Education: N/A   Occupational History  . 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 Smoking status: Former Smoker    Quit date: 01/24/2009  . Smokeless  tobacco: Never Used  . Alcohol Use: No  . Drug Use: No  . Sexually Active: Not on file   Other Topics Concern  . Not on file   Social History Narrative  . No narrative on file    Family History  Problem Relation Age of Onset  . Cancer Mother 1    breast cancer  . COPD Father   . Heart disease Father     CHF  . Atrial fibrillation Father   . Angelman syndrome Daughter   . Seizures Daughter   . Cancer Paternal Grandmother     stomach  . Thyroid disease Sister    Current Outpatient Prescriptions on File Prior to Visit  Medication Sig Dispense Refill  . Ascorbic Acid (VITAMIN C) 100 MG tablet Take 100 mg by mouth daily.        . Calcium Carbonate-Vitamin D (CALCIUM + D PO) Take by mouth 2 (two) times daily.        . cholecalciferol (VITAMIN D) 1000 UNITS tablet Take 5,000 Units by mouth daily.       [name] esomeprazole (NEXIUM) 40 MG capsule Take 40 mg by mouth daily before breakfast.      . Flaxseed, Linseed, OIL Take 11 each by mouth.        . Probiotic Product (SOLUBLE FIBER/PROBIOTICS PO) Take by mouth.        [name]  Specialty Vitamins Products (MAGNESIUM, AMINO ACID CHELATE,) 133 MG tablet Take 1 tablet by mouth 2 (two) times daily.      . Coenzyme Q10 (CO Q 10) 10 MG CAPS Take by mouth.        Pt states she stopped Axid, changed to nexium.    Allergies  Allergen Reactions  . Codeine   . Vicodin (Hydrocodone-Acetaminophen) Itching and Nausea And Vomiting   Review of Systems  The patient denies anorexia, fever, weight changes, headaches, vision changes, decreased hearing, ear pain, breast concerns, chest pain, palpitations, dizziness, syncope, dyspnea on exertion, swelling, nausea, vomiting, constipation, abdominal pain, melena, hematochezia, indigestion/heartburn, hematuria, incontinence, dysuria, no vaginal bleeding, vaginal discharge, odor or itch, genital lesions,joint pains, numbness, tingling, weakness, tremor, suspicious skin lesions, depression, anxiety, abnormal  bleeding/bruising, or enlarged lymph nodes.  +decreased libido  Objective:   Physical Exam  BP 138/90  Pulse 60  Ht 5\' 4"  (1.626 m)  Wt 153 lb (69.4 kg)  BMI 26.26 kg/m2  General Appearance:  Alert, cooperative, no distress, appears stated age   Head:  Normocephalic, without obvious abnormality, atraumatic   Eyes:  PERRL, conjunctiva/corneas clear, EOM's intact, fundi  benign   Ears:  Normal TM's and external ear canals   Nose:  Nares normal  Throat:  Lips, mucosa, and tongue normal; teeth and gums normal   Neck:  Supple, no lymphadenopathy; thyroid: no enlargement/tenderness/nodules; no carotid  bruit or JVD   Back:  Spine nontender, no curvature, ROM normal, no CVA tenderness   Lungs:  Clear to auscultation bilaterally without wheezes, rales or ronchi; respirations unlabored   Chest Wall:  No tenderness or deformity   Heart:  Regular rate and rhythm, S1 and S2 normal, no murmur, rub  or gallop   Breast Exam:  No tenderness, masses, or nipple discharge or inversion. No axillary lymphadenopathy   Abdomen:  Soft, non-tender, nondistended, normoactive bowel sounds,  no masses, no hepatosplenomegaly   Genitalia:  Normal external genitalia without lesions.  BUS and vagina normal; cervix is absent No abnormal vaginal discharge. Uterus is absent. No adnexal masses, R adnexa palpable, nontender, not enlarged.  Rectal:  Normal tone, no masses or tenderness; guaiac negative stool, large external hemorrhoids   Extremities:  No clubbing, cyanosis or edema   Pulses:  2+ and symmetric all extremities   Skin:  Skin color, texture, turgor normal, no rashes or lesions   Lymph nodes:  Cervical, supraclavicular, and axillary nodes normal   Neurologic:  CNII-XII intact, normal strength, sensation and gait; reflexes 2+ and symmetric throughout   Psych: Normal mood, affect, hygiene and grooming.   ASSESSMENT/PLAN: 1. Physical exam, routine  POCT Urinalysis Dipstick, Visual acuity screening,  Comprehensive metabolic panel  2. Pure hypercholesterolemia  Comprehensive metabolic panel, Lipid panel  3. Osteopenia    4. Hair loss  TSH  5. Need for Tdap vaccination  Tdap vaccine greater than or equal to 7yo IM   Discussed monthly self breast exams and yearly mammograms after the age of 107; at least 30 minutes of aerobic activity at least 5 days/week; proper sunscreen use reviewed; healthy diet, including goals of calcium and vitamin D intake and alcohol recommendations (less than or equal to 1 drink/day) reviewed; regular seatbelt use; changing batteries in smoke detectors.  Immunization recommendations discussed.  Colonoscopy recommendations reviewed  DEXA due again next year.  Continue calcium, Vitamin D, weight-bearing exercise

## 2011-07-29 NOTE — Patient Instructions (Signed)
HEALTH MAINTENANCE RECOMMENDATIONS:  It is recommended that you get at least 30 minutes of aerobic exercise at least 5 days/week (for weight loss, you may need as much as 60-90 minutes). This can be any activity that gets your heart rate up. This can be divided in 10-15 minute intervals if needed, but try and build up your endurance at least once a week.  Weight bearing exercise is also recommended twice weekly.  Eat a healthy diet with lots of vegetables, fruits and fiber.  "Colorful" foods have a lot of vitamins (ie green vegetables, tomatoes, red peppers, etc).  Limit sweet tea, regular sodas and alcoholic beverages, all of which has a lot of calories and sugar.  Up to 1 alcoholic drink daily may be beneficial for women (unless trying to lose weight, watch sugars).  Drink a lot of water.  Calcium recommendations are 1200-1500 mg daily (1500 mg for postmenopausal women or women without ovaries), and vitamin D 1000 IU daily.  This should be obtained from diet and/or supplements (vitamins), and calcium should not be taken all at once, but in divided doses.  Monthly self breast exams and yearly mammograms for women over the age of 52 is recommended.  Sunscreen of at least SPF 30 should be used on all sun-exposed parts of the skin when outside between the hours of 10 am and 4 pm (not just when at beach or pool, but even with exercise, golf, tennis, and yard work!)  Use a sunscreen that says "broad spectrum" so it covers both UVA and UVB rays, and make sure to reapply every 1-2 hours.  Remember to change the batteries in your smoke detectors when changing your clock times in the spring and fall.  Use your seat belt every time you are in a car, and please drive safely and not be distracted with cell phones and texting while driving.  We will notify you of your results in a few days.  If your cholesterol is high and we recommend that you make sure to take the red yeast rice every day, and return to have  labs checked--to make sure it is working adequately and not causing any problems for the liver.  Please do not use tanning beds!

## 2011-07-31 ENCOUNTER — Encounter: Payer: Self-pay | Admitting: Family Medicine

## 2011-07-31 DIAGNOSIS — E782 Mixed hyperlipidemia: Secondary | ICD-10-CM

## 2011-08-03 ENCOUNTER — Other Ambulatory Visit: Payer: Self-pay | Admitting: Family Medicine

## 2011-08-03 DIAGNOSIS — Z803 Family history of malignant neoplasm of breast: Secondary | ICD-10-CM

## 2011-08-03 DIAGNOSIS — Z1231 Encounter for screening mammogram for malignant neoplasm of breast: Secondary | ICD-10-CM

## 2011-08-04 ENCOUNTER — Telehealth: Payer: Self-pay | Admitting: Family Medicine

## 2011-08-10 NOTE — Telephone Encounter (Signed)
TSD  

## 2011-08-16 ENCOUNTER — Ambulatory Visit
Admission: RE | Admit: 2011-08-16 | Discharge: 2011-08-16 | Disposition: A | Discharge status: Home / Self Care | Payer: BC Managed Care – PPO | Source: Ambulatory Visit | Attending: Family Medicine | Admitting: Family Medicine

## 2011-08-16 DIAGNOSIS — Z803 Family history of malignant neoplasm of breast: Secondary | ICD-10-CM

## 2011-08-16 DIAGNOSIS — Z1231 Encounter for screening mammogram for malignant neoplasm of breast: Secondary | ICD-10-CM

## 2011-09-02 ENCOUNTER — Ambulatory Visit (INDEPENDENT_AMBULATORY_CARE_PROVIDER_SITE_OTHER): Payer: BC Managed Care – PPO | Admitting: Family Medicine

## 2011-09-02 ENCOUNTER — Other Ambulatory Visit: Payer: Self-pay

## 2011-09-02 VITALS — BP 124/80 | HR 66 | Temp 98.3°F | Wt 154.0 lb

## 2011-09-02 DIAGNOSIS — J029 Acute pharyngitis, unspecified: Secondary | ICD-10-CM

## 2011-09-02 LAB — POCT RAPID STREP A (OFFICE): Rapid Strep A Screen: NEGATIVE

## 2011-09-02 MED ORDER — ZOLPIDEM TARTRATE 10 MG PO TABS: 10.0000 mg | ORAL_TABLET | Freq: Every evening | ORAL | Status: DC | PRN

## 2011-09-02 NOTE — Patient Instructions (Signed)
Pharyngitis, Viral and Bacterial Pharyngitis is soreness (inflammation) or infection of the pharynx. It is also called a sore throat. CAUSES  Most sore throats are caused by viruses and are part of a cold. However, some sore throats are caused by strep and other bacteria. Sore throats can also be caused by post nasal drip from draining sinuses, allergies and sometimes from sleeping with an open mouth. Infectious sore throats can be spread from person to person by coughing, sneezing and sharing cups or eating utensils. TREATMENT  Sore throats that are viral usually last 3-4 days. Viral illness will get better without medications (antibiotics). Strep throat and other bacterial infections will usually begin to get better about 24-48 hours after you begin to take antibiotics. HOME CARE INSTRUCTIONS   If the caregiver feels there is a bacterial infection or if there is a positive strep test, they will prescribe an antibiotic. The full course of antibiotics must be taken. If the full course of antibiotic is not taken, you or your child may become ill again. If you or your child has strep throat and do not finish all of the medication, serious heart or kidney diseases may develop.   Drink enough water and fluids to keep your urine clear or pale yellow.   Only take over-the-counter or prescription medicines for pain, discomfort or fever as directed by your caregiver.   Get lots of rest.   Gargle with salt water ( tsp. of salt in a glass of water) as often as every 1-2 hours as you need for comfort.   Hard candies may soothe the throat if individual is not at risk for choking. Throat sprays or lozenges may also be used.  SEEK MEDICAL CARE IF:   Large, tender lumps in the neck develop.   A rash develops.   Green, yellow-brown or bloody sputum is coughed up.   Your baby is older than 3 months with a rectal temperature of 100.5 F (38.1 C) or higher for more than 1 day.  SEEK IMMEDIATE MEDICAL CARE  IF:   A stiff neck develops.   You or your child are drooling or unable to swallow liquids.   You or your child are vomiting, unable to keep medications or liquids down.   You or your child has severe pain, unrelieved with recommended medications.   You or your child are having difficulty breathing (not due to stuffy nose).   You or your child are unable to fully open your mouth.   You or your child develop redness, swelling, or severe pain anywhere on the neck.   You have a fever.   Your baby is older than 3 months with a rectal temperature of 102 F (38.9 C) or higher.   Your baby is 33 months old or younger with a rectal temperature of 100.4 F (38 C) or higher.  MAKE SURE YOU:   Understand these instructions.   Will watch your condition.   Will get help right away if you are not doing well or get worse.  Document Released: 02/22/2005 Document Revised: 02/11/2011 Document Reviewed: 05/22/2007 [facility] Patient Information 2012 [place], [place]. Take 2 Aleve twice per day to help with the aches and pains and that should help you sleep

## 2011-09-02 NOTE — Progress Notes (Signed)
  Subjective:    Patient ID: Anna Mckinney, female    DOB: 06/15/1950, 61 y.o.   MRN: [id number]  HPI 2 days ago she started having difficulty with malaise, myalgias followed by sore throat, ear and sinus congestion,sneezing,chills.   Review of Systems     Objective:   Physical Exam alert and in no distress. Tympanic membranes and canals are normal. Throat is clear. Tonsils are normal. Neck is supple without adenopathy or thyromegaly. Cardiac exam shows a regular sinus rhythm without murmurs or gallops. Lungs are clear to auscultation. Strep test negative      Assessment & Plan:   1. Sore throat  POCT rapid strep A   recommend 2 Aleve twice per day to help with the aches and pains. We will also call in Ambien which he states can help her with her sleep. If she continues to have difficulty, she is to followup with me.

## 2011-10-06 ENCOUNTER — Ambulatory Visit (INDEPENDENT_AMBULATORY_CARE_PROVIDER_SITE_OTHER): Payer: BC Managed Care – PPO | Admitting: Medical

## 2011-10-06 ENCOUNTER — Encounter: Payer: Self-pay | Admitting: Medical

## 2011-10-06 VITALS — BP 120/80 | HR 68 | Temp 97.6°F | Resp 16 | Wt 153.0 lb

## 2011-10-06 DIAGNOSIS — R350 Frequency of micturition: Secondary | ICD-10-CM

## 2011-10-06 DIAGNOSIS — J40 Bronchitis, not specified as acute or chronic: Secondary | ICD-10-CM

## 2011-10-06 DIAGNOSIS — R300 Dysuria: Secondary | ICD-10-CM

## 2011-10-06 LAB — POCT URINALYSIS DIPSTICK
Bilirubin, UA: NEGATIVE
Blood, UA: NEGATIVE
Ketones, UA: NEGATIVE
Leukocytes, UA: NEGATIVE
pH, UA: 5

## 2011-10-06 MED ORDER — SULFAMETHOXAZOLE-TRIMETHOPRIM 800-160 MG PO TABS: 1.0000 | ORAL_TABLET | Freq: Two times a day (BID) | ORAL | Status: AC

## 2011-10-06 MED ORDER — ZOLPIDEM TARTRATE 10 MG PO TABS: 10.0000 mg | ORAL_TABLET | Freq: Every evening | ORAL | Status: DC | PRN

## 2011-10-06 NOTE — Progress Notes (Signed)
Subjective:   HPI  Anna Mckinney is a 61 y.o. female who presents with illness.  She notes 1 day hx/o nausea, not feeling well, sore throat, hoarse throat, slight cough, but also having back pain, urinary frequency.  Has some urinary odor.  Denies burning with urination, no belly pain, but has lower abdominal pressure.  Back hurts all along mid low back.  Denies fever, but felt sweaty this morning.  Denies rash, no tick bite, no vomiting, no diarrhea.  No sick contacts with similar.  She notes occasional prior UTI, but no frequent.  No vaginal discharge or vaginal c/o.  Using some Tylenol last night and some tessalon Perles for cough.  No other aggravating or relieving factors.    No other c/o.  The following portions of the patient's history were reviewed and updated as appropriate: allergies, current medications, past family history, past medical history, past social history, past surgical history and problem list.  Past Medical History  Diagnosis Date  . Barrett's esophagus     last EGD 2011 in [facility]  . External hemorrhoids 2012    getting banding every other week with Dr. Kinnie Scales   . Osteopenia   . Impaired fasting glucose   . Increased serum lipids   . Hyperlipidemia     Allergies  Allergen Reactions  . Codeine   . Vicodin (Hydrocodone-Acetaminophen) Itching and Nausea And Vomiting     Review of Systems ROS reviewed and was negative other than noted in HPI or above.    Objective:   Physical Exam  General appearance: alert, no distress, WD/WN, somewhat ill appearing HEENT: normocephalic, sclerae anicteric, left TM with erythema,right TM normal appearing, nares patent, no discharge or erythema, pharynx normal Oral cavity: MMM, no lesions Neck: supple, no lymphadenopathy, no thyromegaly, no masses  Heart: RRR, normal S1, S2, no murmurs Lungs: scattered rhonchi, decreased breath sounds, no wheezes or rales Abdomen: +bs, soft, mild generalized lower abdominal tenderness,  non distended, no masses, no hepatomegaly, no splenomegaly Back: nontender Pulses: 2+ symmetric   Assessment and Plan :     Encounter Diagnoses  Name Primary?  . Bronchitis Yes  . Dysuria   . Urine frequency    Urine culture sent.  Will treat empirically for bronchitis and possible UTI with bactrim.  Advised rest, hydration, and call/return if worse or not improving.

## 2011-10-08 LAB — URINE CULTURE

## 2011-10-18 ENCOUNTER — Ambulatory Visit
Admission: RE | Admit: 2011-10-18 | Discharge: 2011-10-18 | Disposition: A | Discharge status: Home / Self Care | Payer: BC Managed Care – PPO | Source: Ambulatory Visit | Attending: Medical | Admitting: Medical

## 2011-10-18 ENCOUNTER — Other Ambulatory Visit: Payer: Self-pay | Admitting: Medical

## 2011-10-18 ENCOUNTER — Ambulatory Visit (INDEPENDENT_AMBULATORY_CARE_PROVIDER_SITE_OTHER): Payer: BC Managed Care – PPO | Admitting: Medical

## 2011-10-18 ENCOUNTER — Encounter: Payer: Self-pay | Admitting: Medical

## 2011-10-18 VITALS — BP 110/80 | HR 78 | Temp 98.1°F | Resp 16 | Wt 153.0 lb

## 2011-10-18 DIAGNOSIS — R109 Unspecified abdominal pain: Secondary | ICD-10-CM

## 2011-10-18 DIAGNOSIS — R350 Frequency of micturition: Secondary | ICD-10-CM

## 2011-10-18 DIAGNOSIS — R059 Cough, unspecified: Secondary | ICD-10-CM

## 2011-10-18 DIAGNOSIS — J3489 Other specified disorders of nose and nasal sinuses: Secondary | ICD-10-CM

## 2011-10-18 DIAGNOSIS — R05 Cough: Secondary | ICD-10-CM

## 2011-10-18 LAB — POCT URINALYSIS DIPSTICK
Bilirubin, UA: NEGATIVE
Glucose, UA: NEGATIVE
Ketones, UA: NEGATIVE
Leukocytes, UA: NEGATIVE
Nitrite, UA: NEGATIVE

## 2011-10-18 MED ORDER — FLUCONAZOLE 150 MG PO TABS: ORAL_TABLET | ORAL | Status: DC

## 2011-10-18 MED ORDER — FESOTERODINE FUMARATE ER 4 MG PO TB24: 4.0000 mg | ORAL_TABLET | Freq: Every day | ORAL | Status: DC

## 2011-10-18 MED ORDER — LEVOFLOXACIN 500 MG PO TABS: 500.0000 mg | ORAL_TABLET | Freq: Every day | ORAL | Status: AC

## 2011-10-18 MED ORDER — PREDNISONE 10 MG PO TABS: ORAL_TABLET | ORAL | Status: DC

## 2011-10-18 NOTE — Progress Notes (Signed)
Subjective: Here for recheck, "not better".   I saw her 10/06/11 for cough, bronchitis and abdominal pressure.  She is still having nasal congestion, sinus pressure, head stopped up, left ear pain, sore throat, Had fever last night around 101.  She did the neti pet, used all the bactrim antibiotic.  Cough is nonproductive, no thick nasal drainage.  Feels drained and achy.   No sick contacts.  She is a former smoker, quit 3 years ago, but smoked from age 61 yo until 3 years ago.  Still having pelvic pressure like she has to urinate.  Last visit urine culture was not significant for UTI.  Still has urgency to urinate.  Does urinate a lot during the day. She does hx/o rectal prolapse.  Denies urinary burning, no vaginal discharge, no hematuria or vaginal bleeding.   Just had normal pap in May, hx/o hysterectomy.   She has hx/o hemorrhoids, prior banding, and sees gynecology for hemorrhoids and routine care.   Past Medical History  Diagnosis Date  . Barrett's esophagus     last EGD 2011 in [facility]  . External hemorrhoids 2012    getting banding every other week with Dr. Kinnie Scales   . Osteopenia   . Impaired fasting glucose   . Increased serum lipids   . Hyperlipidemia    Review of Systems Constitutional: +fever, -chills, -sweats, -unexpected -weight change,+fatigue Cardiology:  -chest pain, -palpitations, -edema Respiratory: +cough, +shortness of breath, -wheezing Gastroenterology: -nausea, -vomiting, -diarrhea, -constipation  Musculoskeletal: -arthralgias, -myalgias, -joint swelling, -back pain Neurology: -headache, -weakness, -tingling, -numbness  Ojective; Gen: wd, wn, nad Skin:unremarkable HEENT: sinus tenderness, conjunctiva normal, nares with right sided turbinate edema, pharynx normal Oral: MMM Neck: supple, nontender, no lymphadenopathy or mass Heart: RRR, normal S1, S2, no murmur Lungs: upper field rhonchi, no rales or wheezes Abdomen: lower abdominal tenderness generalized, no  mass or organomeagly Back: nontender GU: normal external genitalia, no obvious bladder or uterine prolapse, there is some mild white vaginal discharge, no adnexal tenderness Rectal: large collection of multiple hemorrhoids external, exam chaperoned by nurse   Assessment: Encounter Diagnoses  Name Primary?  . Cough Yes  . Abdominal pressure   . Urinary frequency   . Sinus pressure     Plan: Cough - labs and CXR today.  i suspect unresolved bronchitis.  Will likely begin different antibiotic and steroid taper.    Abdominal pressure - yeats on wet prep/koh prep.  Will treat for yeast with Diflucan but she also has symptoms overactive bladder.  Once the bronchitis resolves, she will begin samples of Toviaz.  discussed risks/benefits of medications.    Urinary frequency - recent urine culture insignificant.  Will begin trial of Toviaz  Sinus pressure - pending CXR will likely begin different antibiotic.  C/t neti pot, good hydration.

## 2011-10-19 ENCOUNTER — Other Ambulatory Visit: Payer: Self-pay | Admitting: Medical

## 2011-10-19 DIAGNOSIS — R9389 Abnormal findings on diagnostic imaging of other specified body structures: Secondary | ICD-10-CM

## 2011-10-19 DIAGNOSIS — Z87891 Personal history of nicotine dependence: Secondary | ICD-10-CM

## 2011-10-19 DIAGNOSIS — J42 Unspecified chronic bronchitis: Secondary | ICD-10-CM

## 2011-10-19 LAB — CBC WITH DIFFERENTIAL/PLATELET
Basophils Absolute: 0 10*3/uL (ref 0.0–0.1)
Basophils Relative: 0 % (ref 0–1)
Hemoglobin: 14.3 g/dL (ref 12.0–15.0)
MCHC: 35 g/dL (ref 30.0–36.0)
Monocytes Relative: 9 % (ref 3–12)
Neutro Abs: 2.6 10*3/uL (ref 1.7–7.7)
Neutrophils Relative %: 48 % (ref 43–77)
Platelets: 231 10*3/uL (ref 150–400)

## 2011-10-19 LAB — BASIC METABOLIC PANEL
Glucose, Bld: 84 mg/dL (ref 70–99)
Potassium: 4.1 mEq/L (ref 3.5–5.3)
Sodium: 139 mEq/L (ref 135–145)

## 2011-10-19 NOTE — Progress Notes (Signed)
PATIENT IS AWARE OF HER APPOINTMENT ON 10/27/11 @ 300 PM AT DR.WERT'S OFFICE [phone] FOR A PFT. CLS

## 2011-10-20 ENCOUNTER — Telehealth: Payer: Self-pay | Admitting: Family Medicine

## 2011-10-20 NOTE — Telephone Encounter (Signed)
Pt called wanted xray sent to Dr. Sherene Sires, he is on Epic and will be able to see xray and report

## 2011-10-27 ENCOUNTER — Ambulatory Visit (INDEPENDENT_AMBULATORY_CARE_PROVIDER_SITE_OTHER): Payer: BC Managed Care – PPO | Admitting: Internal Medicine

## 2011-10-27 DIAGNOSIS — R05 Cough: Secondary | ICD-10-CM

## 2011-10-27 LAB — PULMONARY FUNCTION TEST

## 2011-10-27 NOTE — Progress Notes (Signed)
PFT done today. 

## 2011-10-28 ENCOUNTER — Telehealth: Payer: Self-pay | Admitting: Internal Medicine

## 2011-10-28 NOTE — Telephone Encounter (Signed)
Pt would like to know her results for her pulmonary function test

## 2011-10-29 ENCOUNTER — Telehealth: Payer: Self-pay | Admitting: Internal Medicine

## 2011-10-29 NOTE — Telephone Encounter (Signed)
I called over to Dr. Thurston Hole office inquiring about the patients PFT results. They took the message and state that someone will give me a call back. CLS

## 2011-10-29 NOTE — Telephone Encounter (Signed)
pls check on spirometry.  i don't see the results

## 2011-10-29 NOTE — Telephone Encounter (Signed)
Called and spoke with chandra and they did get these results of the PFT.  Nothing further needed.

## 2011-10-29 NOTE — Telephone Encounter (Signed)
The results report is at your work station. Pt has called two times since she has it done wanting to know her results

## 2011-11-03 ENCOUNTER — Encounter: Payer: Self-pay | Admitting: Internal Medicine

## 2011-11-10 ENCOUNTER — Telehealth: Payer: Self-pay | Admitting: Family Medicine

## 2011-11-10 NOTE — Telephone Encounter (Signed)
Since she continues to have head stuffiness, if agreeable lets try a week or 2 of a combination of nasal spray and antihistamine such as Flonase and Zyrtec.  If agreeable I'll send to pharmacy.  If not agreeable then I would consider CT sinuses to see whether or not she actually has thickened mucous in sinuses confirming sinus infection.    Let me know if she needs to talk to me or ask other questions.  I don't think ENT would necessarily be the next step vs pulmonology or other.

## 2011-11-10 NOTE — Telephone Encounter (Signed)
Message copied by Janeice Robinson on Wed Nov 10, 2011  3:26 PM ------      Message from: Aleen Campi, DAVID S      Created: Wed Nov 03, 2011  3:14 PM       i have reviewed the spirometry results which was normal.  i reviewed the recent chest xray.  Has she finally gotten over the bronchitis?

## 2011-11-10 NOTE — Telephone Encounter (Signed)
Patient states that she is not any better. Her ears are stop up and she is stuffy. She said she would like a referral to the ENT doctor. CLS

## 2011-11-11 NOTE — Telephone Encounter (Signed)
Patient called and requested a referral to see a ENT doctor. Laureen Ochs brought the message and ask me to work on the referral so I did for the patient. I called the patient back 10 minutes later to inform her of the ENT appointment that I had made for her and she states that she called a ENT office and got herself an appointment for 11/11/11. I told to her appointment and I would Cancell the appointment I had just made for her. We will wait to see what her ENT office said before we move on to other things. CLS

## 2011-12-14 ENCOUNTER — Telehealth: Payer: Self-pay | Admitting: Internal Medicine

## 2011-12-14 NOTE — Telephone Encounter (Signed)
I spoke with the pt and advised that she will need an appt to discuss results with Dr. Sherene Sires since she has never seen him in the past. Appt set for 12-22-11 at 3pm. Carron Curie, CMA

## 2011-12-17 ENCOUNTER — Other Ambulatory Visit (INDEPENDENT_AMBULATORY_CARE_PROVIDER_SITE_OTHER): Payer: BC Managed Care – PPO

## 2011-12-17 DIAGNOSIS — Z23 Encounter for immunization: Secondary | ICD-10-CM

## 2011-12-22 ENCOUNTER — Encounter: Payer: Self-pay | Admitting: Internal Medicine

## 2011-12-22 ENCOUNTER — Ambulatory Visit (INDEPENDENT_AMBULATORY_CARE_PROVIDER_SITE_OTHER): Payer: BC Managed Care – PPO | Admitting: Internal Medicine

## 2011-12-22 VITALS — BP 108/70 | HR 78 | Temp 97.8°F | Ht 64.5 in | Wt 157.0 lb

## 2011-12-22 DIAGNOSIS — R05 Cough: Secondary | ICD-10-CM

## 2011-12-22 MED ORDER — PREDNISONE (PAK) 10 MG PO TABS: ORAL_TABLET | ORAL | Status: DC

## 2011-12-22 MED ORDER — NIZATIDINE 150 MG PO CAPS: 150.0000 mg | ORAL_CAPSULE | Freq: Every day | ORAL | Status: DC

## 2011-12-22 MED ORDER — TRAMADOL HCL 50 MG PO TABS: ORAL_TABLET | ORAL | Status: DC

## 2011-12-22 NOTE — Progress Notes (Signed)
  Subjective:    Patient ID: Anna Mckinney, female    DOB: Dec 06, 1950  MRN: [id number]  HPI  72 yowf quit smoking in 2010 with no sequelae  But dx barrett's by [facility] not on maint ppi then onset cough August of 2013 and self referred 12/22/2011 to pulmonary clinic.   12/22/2011 1st pulm ov/ [facility] cc acute onset cough Aug 2013 assoc with sinus problem > ent eval and rx and sinus problems resolved  but now daily honking quality dry while on nexium prn for indigestion s assoc sob unless coughing.  No obvious daytime variabilty or   cp or chest tightness, subjective wheeze overt sinus or hb symptoms. No unusual exp hx or h/o childhood pna/ asthma or premature birth to her knowledge.   Sleeping ok without nocturnal  or early am exacerbation  of respiratory  c/o's or need for noct saba. Also denies any obvious fluctuation of symptoms with weather or environmental changes or other aggravating or alleviating factors except as outlined above     Review of Systems  Constitutional: Negative for fever, chills and unexpected weight change.  HENT: Negative for ear pain, nosebleeds, congestion, sore throat, rhinorrhea, sneezing, trouble swallowing, dental problem, voice change, postnasal drip and sinus pressure.   Eyes: Negative for visual disturbance.  Respiratory: Positive for cough. Negative for choking and shortness of breath.   Cardiovascular: Negative for chest pain and leg swelling.  Gastrointestinal: Negative for vomiting, abdominal pain and diarrhea.  Genitourinary: Negative for difficulty urinating.  Musculoskeletal: Negative for arthralgias.  Skin: Negative for rash.  Neurological: Negative for tremors, syncope and headaches.  Hematological: Does not bruise/bleed easily.       Objective:   Physical Exam  Amb wf with honking quality cough  Wt Readings from Last 3 Encounters:  12/22/11 157 lb (71.215 kg)  10/18/11 153 lb (69.4 kg)  10/06/11 153 lb (69.4 kg)    HEENT: nl dentition,  turbinates, and orophanx. Nl external ear canals without cough reflex   NECK :  without JVD/Nodes/TM/ nl carotid upstrokes bilaterally   LUNGS: no acc muscle use, clear to A and P bilaterally without cough on insp or exp maneuvers   CV:  RRR  no s3 or murmur or increase in P2, no edema   ABD:  soft and nontender with nl excursion in the supine position. No bruits or organomegaly, bowel sounds nl  MS:  warm without deformities, calf tenderness, cyanosis or clubbing  SKIN: warm and dry without lesions    NEURO:  alert, approp, no deficits    CXR  12/22/2011 :  Hyperexpanded lungs and chronic bronchitic change without acute  cardiopulmonary disease.        Assessment & Plan:

## 2011-12-22 NOTE — Patient Instructions (Addendum)
The key to effective treatment for your cough is eliminating the non-stop cycle of cough you're stuck in long enough to let your airway heal completely and then see if there is anything still making you cough once you stop the cough suppression, but this should take no more than 5 days to figuure out  First take delsym two tsp every 12 hours and supplement if needed with  tramadol 50 mg up to 2 every 4 hours to suppress the urge to cough. Swallowing water or using ice chips/non mint and menthol containing candies (such as lifesavers or sugarless jolly ranchers) are also effective.  You should rest your voice and avoid activities that you know make you cough.  Once you have eliminated the cough for 3 straight days try reducing the tramadol first,  then the delsym as tolerated.    Nexium 40 mg   Take 30-60 min before first meal of the day and axid 150 mg one bedtime until return  GERD (REFLUX)  is an extremely common cause of respiratory symptoms like cough, many times with no significant heartburn at all.    It can be treated with medication, but also with lifestyle changes including avoidance of late meals, excessive alcohol, smoking cessation, and avoid fatty foods, chocolate, peppermint, colas, red wine, and acidic juices such as orange juice.  NO MINT OR MENTHOL PRODUCTS SO NO COUGH DROPS  USE SUGARLESS CANDY INSTEAD (jolley ranchers or Stover's)  NO OIL BASED VITAMINS - use powdered substitutes.    Prednisone 10 mg take  4 each am x 2 days,   2 each am x 2 days,  1 each am x2days and stop    Please schedule a follow up office visit in 2 weeks, sooner if needed

## 2011-12-23 NOTE — Assessment & Plan Note (Signed)
The most common causes of chronic cough in immunocompetent adults include the following: upper airway cough syndrome (UACS), previously referred to as postnasal drip syndrome (PNDS), which is caused by variety of rhinosinus conditions; (2) asthma; (3) GERD; (4) chronic bronchitis from cigarette smoking or other inhaled environmental irritants; (5) nonasthmatic eosinophilic bronchitis; and (6) bronchiectasis.   These conditions, singly or in combination, have accounted for up to 94% of the causes of chronic cough in prospective studies.   Other conditions have constituted no >6% of the causes in prospective studies These have included bronchogenic carcinoma, chronic interstitial pneumonia, sarcoidosis, left ventricular failure, ACEI-induced cough, and aspiration from a condition associated with pharyngeal dysfunction.   This pattern is classic for  Classic Upper airway cough syndrome, so named because it's frequently impossible to sort out how much is  CR/sinusitis with freq throat clearing (which can be related to primary GERD)   vs  causing  secondary (" extra esophageal")  GERD from wide swings in gastric pressure that occur with throat clearing, often  promoting self use of mint and menthol lozenges that reduce the lower esophageal sphincter tone and exacerbate the problem further in a cyclical fashion.   These are the same pts (now being labeled as having "irritable larynx syndrome" by some cough centers) who not infrequently have a history of having failed to tolerate ace inhibitors,  dry powder inhalers or biphosphonates or report having atypical reflux symptoms that don't respond to standard doses of PPI , and are easily confused as having aecopd or asthma flares by even experienced allergists/ pulmonologists.   As she has Barrett's hx, should not only take ppi as maint, should use max rx when coughing since cough makes gerd worse  See instructions for specific recommendations which were reviewed  directly with the patient who was given a copy with highlighter outlining the key components.

## 2012-01-05 ENCOUNTER — Ambulatory Visit: Payer: BC Managed Care – PPO | Admitting: Internal Medicine

## 2012-04-14 ENCOUNTER — Telehealth: Payer: Self-pay | Admitting: Family Medicine

## 2012-04-14 MED ORDER — ZOLPIDEM TARTRATE 10 MG PO TABS: 10.0000 mg | ORAL_TABLET | Freq: Every evening | ORAL | Status: DC | PRN

## 2012-04-14 NOTE — Telephone Encounter (Signed)
PT CALLED AND STATED SHE IS HAVING TROUBLE SLEEPING AND WANTED A REFILL ON HER AMBIEN. PT STATES THAT SHE HAS BEEN PRESCRIBED THIS PERIODONTICALLY IN THE PAST. PT USES [facility] [address].

## 2012-04-14 NOTE — Telephone Encounter (Signed)
CALLED AMBIEN IN PER JCL 

## 2012-04-14 NOTE — Telephone Encounter (Signed)
Okay to renew

## 2012-04-17 ENCOUNTER — Telehealth: Payer: Self-pay | Admitting: Family Medicine

## 2012-04-18 NOTE — Telephone Encounter (Signed)
Pt advised Ambien refilled.

## 2012-07-24 ENCOUNTER — Encounter: Payer: Self-pay | Admitting: Family Medicine

## 2012-07-24 ENCOUNTER — Ambulatory Visit (INDEPENDENT_AMBULATORY_CARE_PROVIDER_SITE_OTHER): Payer: BC Managed Care – PPO | Admitting: Family Medicine

## 2012-07-24 VITALS — BP 120/70 | HR 70 | Wt 158.0 lb

## 2012-07-24 DIAGNOSIS — K219 Gastro-esophageal reflux disease without esophagitis: Secondary | ICD-10-CM | POA: Insufficient documentation

## 2012-07-24 DIAGNOSIS — K2270 Barrett's esophagus without dysplasia: Secondary | ICD-10-CM | POA: Insufficient documentation

## 2012-07-24 MED ORDER — DEXLANSOPRAZOLE 60 MG PO CPDR: 60.0000 mg | DELAYED_RELEASE_CAPSULE | Freq: Every day | ORAL | Status: DC

## 2012-07-24 NOTE — Progress Notes (Signed)
  Subjective:    Patient ID: Anna Mckinney, female    DOB: 22-Oct-1950, 62 y.o.   MRN: [id number]  HPI She is here for reevaluation of continued difficulty with cough. Since being seen in October she has continued to have difficulty with acid sensation and choking sensation as well as burning in the back of her throat. No fever, chills or sore throat. She notes that the cough is worse at night and lying on a slight angle does help. She has tried Nexium as well as Scientist, research (medical) . She is also tried Axid all of which have not totally eliminated her symptoms.   Review of Systems     Objective:   Physical Exam alert and in no distress. Tympanic membranes and canals are normal. Throat is clear. Tonsils are normal. Neck is supple without adenopathy or thyromegaly. Cardiac exam shows a regular sinus rhythm without murmurs or gallops. Lungs are clear to auscultation.        Assessment & Plan:  GERD (gastroesophageal reflux disease)  Barrett's esophagus I will try her on Dexilant. She will let me know how this works next week.

## 2012-07-24 NOTE — Patient Instructions (Addendum)
Gastroesophageal Reflux Disease, Adult Gastroesophageal reflux disease (GERD) happens when acid from your stomach flows up into the esophagus. When acid comes in contact with the esophagus, the acid causes soreness (inflammation) in the esophagus. Over time, GERD may create small holes (ulcers) in the lining of the esophagus. CAUSES   Increased body weight. This puts pressure on the stomach, making acid rise from the stomach into the esophagus.  Smoking. This increases acid production in the stomach.  Drinking alcohol. This causes decreased pressure in the lower esophageal sphincter (valve or ring of muscle between the esophagus and stomach), allowing acid from the stomach into the esophagus.  Late evening meals and a full stomach. This increases pressure and acid production in the stomach.  A malformed lower esophageal sphincter. Sometimes, no cause is found. SYMPTOMS   Burning pain in the lower part of the mid-chest behind the breastbone and in the mid-stomach area. This may occur twice a week or more often.  Trouble swallowing.  Sore throat.  Dry cough.  Asthma-like symptoms including chest tightness, shortness of breath, or wheezing. DIAGNOSIS  Your caregiver may be able to diagnose GERD based on your symptoms. In some cases, X-rays and other tests may be done to check for complications or to check the condition of your stomach and esophagus. TREATMENT  Your caregiver may recommend over-the-counter or prescription medicines to help decrease acid production. Ask your caregiver before starting or adding any new medicines.  HOME CARE INSTRUCTIONS   Change the factors that you can control. Ask your caregiver for guidance concerning weight loss, quitting smoking, and alcohol consumption.  Avoid foods and drinks that make your symptoms worse, such as:  Caffeine or alcoholic drinks.  Chocolate.  Peppermint or mint flavorings.  Garlic and onions.  Spicy foods.  Citrus fruits,  such as oranges, lemons, or limes.  Tomato-based foods such as sauce, chili, salsa, and pizza.  Fried and fatty foods.  Avoid lying down for the 3 hours prior to your bedtime or prior to taking a nap.  Eat small, frequent meals instead of large meals.  Wear loose-fitting clothing. Do not wear anything tight around your waist that causes pressure on your stomach.  Raise the head of your bed 6 to 8 inches with wood blocks to help you sleep. Extra pillows will not help.  Only take over-the-counter or prescription medicines for pain, discomfort, or fever as directed by your caregiver.  Do not take aspirin, ibuprofen, or other nonsteroidal anti-inflammatory drugs (NSAIDs). SEEK IMMEDIATE MEDICAL CARE IF:   You have pain in your arms, neck, jaw, teeth, or back.  Your pain increases or changes in intensity or duration.  You develop nausea, vomiting, or sweating (diaphoresis).  You develop shortness of breath, or you faint.  Your vomit is green, yellow, black, or looks like coffee grounds or blood.  Your stool is red, bloody, or black. These symptoms could be signs of other problems, such as heart disease, gastric bleeding, or esophageal bleeding. MAKE SURE YOU:   Understand these instructions.  Will watch your condition.  Will get help right away if you are not doing well or get worse. Document Released: 12/02/2004 Document Revised: 05/17/2011 Document Reviewed: 09/11/2010 [facility] Patient Information 2013 [place], [place]. Call me next week and let me know how you're doing

## 2012-09-01 ENCOUNTER — Other Ambulatory Visit: Payer: Self-pay | Admitting: Family Medicine

## 2012-09-01 NOTE — Telephone Encounter (Signed)
Okay to renew

## 2012-09-01 NOTE — Telephone Encounter (Signed)
Is this ok?

## 2012-09-25 ENCOUNTER — Encounter: Payer: Self-pay | Admitting: Family Medicine

## 2012-09-25 ENCOUNTER — Ambulatory Visit (INDEPENDENT_AMBULATORY_CARE_PROVIDER_SITE_OTHER): Payer: BC Managed Care – PPO | Admitting: Family Medicine

## 2012-09-25 VITALS — BP 120/84 | HR 60 | Ht 63.75 in | Wt 157.0 lb

## 2012-09-25 DIAGNOSIS — G4700 Insomnia, unspecified: Secondary | ICD-10-CM

## 2012-09-25 DIAGNOSIS — M8580 Other specified disorders of bone density and structure, unspecified site: Secondary | ICD-10-CM

## 2012-09-25 DIAGNOSIS — E559 Vitamin D deficiency, unspecified: Secondary | ICD-10-CM

## 2012-09-25 DIAGNOSIS — E538 Deficiency of other specified B group vitamins: Secondary | ICD-10-CM

## 2012-09-25 DIAGNOSIS — M949 Disorder of cartilage, unspecified: Secondary | ICD-10-CM

## 2012-09-25 DIAGNOSIS — Z0000 Encounter for general adult medical examination without abnormal findings: Secondary | ICD-10-CM

## 2012-09-25 DIAGNOSIS — K625 Hemorrhage of anus and rectum: Secondary | ICD-10-CM

## 2012-09-25 DIAGNOSIS — R5381 Other malaise: Secondary | ICD-10-CM

## 2012-09-25 DIAGNOSIS — E7800 Pure hypercholesterolemia, unspecified: Secondary | ICD-10-CM

## 2012-09-25 DIAGNOSIS — R350 Frequency of micturition: Secondary | ICD-10-CM

## 2012-09-25 DIAGNOSIS — M899 Disorder of bone, unspecified: Secondary | ICD-10-CM

## 2012-09-25 DIAGNOSIS — K644 Residual hemorrhoidal skin tags: Secondary | ICD-10-CM

## 2012-09-25 DIAGNOSIS — R5383 Other fatigue: Secondary | ICD-10-CM

## 2012-09-25 MED ORDER — ZOLPIDEM TARTRATE ER 12.5 MG PO TBCR: 12.5000 mg | EXTENDED_RELEASE_TABLET | Freq: Every evening | ORAL | Status: DC | PRN

## 2012-09-25 MED ORDER — HYDROCORTISONE 2.5 % RE CREA: TOPICAL_CREAM | Freq: Two times a day (BID) | RECTAL | Status: DC

## 2012-09-25 NOTE — Progress Notes (Signed)
Chief Complaint  Patient presents with  . Annual Exam  . Rectal Bleeding   COMPUTER WAS DOWN FOR HER VISIT--DOCUMENTATION DONE AFTER VISIT COMPLETED  Anna Mckinney is a 62 y.o. female who presents for a complete physical.  She has the following concerns:  Insomnia--she has been needing to take Ambien daily.  She feels very tired and sluggish during the day without it.  She is able to get up to bathroom and tend to her daughter, if needed, with the medication.  Denies side effects.  Melatonin wasn't helpful.  Rectal bleeding--she has known hemorrhoids, s/p banding by Dr. Kinnie Scales.  She has had more bleeding this week than usual, noticing the water in the commode being red.  Her stools have been soft, no straining, no pain with BMs, no black stools.  She also gives a h/o prolapsed rectum  Her urine dip today showed 2+ leuks.  She denies vaginal discharge, odor or itch.  She had some increase in urinary frequency, which she related to her body getting rid of swelling she developed in her legs after a long car trip, returning from beach.  She would like bloodwork done today, to include B12. She recalls that when seeing alternative med physician in past that she was put on B12 shots and that she felt like her energy was better.  Also recalls having low Vitamin D in past; compliant with taking supplements.   Immunization History  Administered Date(s) Administered  . Influenza Split 01/12/2011, 12/17/2011  . Influenza Whole 01/06/2007, 11/27/2007, 11/27/2009  . Pneumococcal Conjugate 07/09/2005  . Td 07/09/2005  . Tdap 07/29/2011   Last pap 07/2010, s/p hysterectomy  Mammogram 08/2011 Colonoscopy: 2012 at [facility]; had polyps per pt  EGD UTD per pt (also done by Dr. Noe Gens in [facility] Exercise--none, hasn't been doing zumba like previously DEXA 07/2010 (osteopenia) Sees dentist and ophtho regularly  Past Medical History  Diagnosis Date  . Barrett's esophagus     last EGD 2011 in  [facility]  . External hemorrhoids 2012    getting banding every other week with Dr. Kinnie Scales   . Osteopenia   . Impaired fasting glucose   . Increased serum lipids   . Hyperlipidemia     Past Surgical History  Procedure Laterality Date  . Cholecystectomy  2010  . Abdominal hysterectomy  1984    1 ovary remains; removed for "cancer cells"    History   Social History  . Marital Status: Married    Spouse Name: N/A    Number of Children: 2  . Years of Education: N/A   Occupational History  . 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 Smoking status: Former Smoker -- 0.30 packs/day for 20 years    Types: Cigarettes    Quit date: 01/24/2009  . Smokeless tobacco: Never Used  . Alcohol Use: No  . Drug Use: No  . Sexually Active: Not on file   Other Topics Concern  . Not on file   Social History Narrative  . No narrative on file    Family History  Problem Relation Age of Onset  . Cancer Mother 54    breast cancer  . COPD Father   . Heart disease Father     CHF  . Atrial fibrillation Father   . Angelman syndrome Daughter   . Seizures Daughter   . Cancer Paternal Grandmother     stomach  . Thyroid disease Sister  Current outpatient prescriptions:Ascorbic Acid (VITAMIN C) 100 MG tablet, Take 100 mg by mouth daily.  , Disp: , Rfl: ;  cholecalciferol (VITAMIN D) 1000 UNITS tablet, Take 2,000 Units by mouth daily. , Disp: , Rfl: ;  dexlansoprazole (DEXILANT) 60 MG capsule, Take 1 capsule (60 mg total) by mouth daily., Disp: 30 capsule, Rfl: 11;  Probiotic Product (SOLUBLE FIBER/PROBIOTICS PO), Take 1 tablet by mouth daily. , Disp: , Rfl:  traMADol (ULTRAM) 50 MG tablet, 1-2 every 4 hours as needed for cough or pain, Disp: 40 tablet, Rfl: 0;  zolpidem (AMBIEN) 10 MG tablet, take 1 tablet by mouth at bedtime if needed for sleep, Disp: 15 tablet, Rfl: 1;  Calcium Carbonate-Vitamin D (CALCIUM + D PO), Take by mouth 2 (two) times daily.  , Disp: , Rfl: ;   Specialty Vitamins Products (MAGNESIUM, AMINO ACID CHELATE,) 133 MG tablet, Take 1 tablet by mouth 2 (two) times daily., Disp: , Rfl:  zolpidem (AMBIEN) 10 MG tablet, Take 1 tablet (10 mg total) by mouth at bedtime as needed for sleep., Disp: 15 tablet, Rfl: 0;  zolpidem (AMBIEN) 10 MG tablet, Take 1 tablet (10 mg total) by mouth at bedtime as needed for sleep., Disp: 15 tablet, Rfl: 1  Allergies  Allergen Reactions  . Codeine   . Vicodin (Hydrocodone-Acetaminophen) Itching and Nausea And Vomiting   Review of Systems  The patient denies anorexia, fever, weight changes, headaches, vision changes, decreased hearing, ear pain, breast concerns, chest pain, palpitations, dizziness, syncope, dyspnea on exertion, nausea, vomiting, constipation, abdominal pain, melena, indigestion/heartburn, hematuria, incontinence, dysuria, no vaginal bleeding, vaginal discharge, odor or itch, genital lesions, numbness, tingling, weakness, tremor, suspicious skin lesions, depression, anxiety, abnormal bleeding/bruising, or enlarged lymph nodes. +decreased libido.  +hematochezia/rectal bleeding.  +thoracic back pain.  Uses tramadol occasionally which helps with pain, but causes insomnia. +cough--she saw Dr. Sherene Sires who told her to avoid fish oil.  She was told it was related to her GERD, and nothing was wrong with her lungs.  PHYSICAL EXAM: BP 120/84  Pulse 60  Ht 5' 3.75" (1.619 m)  Wt 157 lb (71.215 kg)  BMI 27.17 kg/m2  General Appearance:  Alert, cooperative, no distress, appears stated age; occasional throat-clearing, no cough   Head:  Normocephalic, without obvious abnormality, atraumatic   Eyes:  PERRL, conjunctiva/corneas clear, EOM's intact, fundi  benign   Ears:  Normal TM's and external ear canals   Nose:  Nares normal   Throat:  Lips, mucosa, and tongue normal; teeth and gums normal   Neck:  Supple, no lymphadenopathy; thyroid: no enlargement/tenderness/nodules; no carotid  bruit or JVD   Back:  Spine  nontender, no curvature, ROM normal, no CVA tenderness   Lungs:  Clear to auscultation bilaterally without wheezes, rales or ronchi; respirations unlabored   Chest Wall:  No tenderness or deformity   Heart:  Regular rate and rhythm, S1 and S2 normal, no murmur, rub  or gallop   Breast Exam:  No tenderness, masses, or nipple discharge or inversion. No axillary lymphadenopathy   Abdomen:  Soft, non-tender, nondistended, normoactive bowel sounds,  no masses, no hepatosplenomegaly   Genitalia:  Normal external genitalia without lesions. BUS and vagina normal; No abnormal vaginal discharge. Uterus is absent. No adnexal masses, R adnexa palpable, nontender, not enlarged.   Rectal:  Normal tone, no masses or tenderness; guaiac negative stool; large external hemorrhoids, with active inflammation and recent bleeding.  No active bleeding, no thrombosis  Extremities:  No clubbing, cyanosis or  edema   Pulses:  2+ and symmetric all extremities   Skin:  Skin color, texture, turgor normal, no rashes or lesions   Lymph nodes:  Cervical, supraclavicular, and axillary nodes normal   Neurologic:  CNII-XII intact, normal strength, sensation and gait; reflexes 2+ and symmetric throughout          Psych: Normal mood, affect, hygiene and grooming.    ASSESSMENT/PLAN:  Routine general medical examination at a health care facility  Other malaise and fatigue - Plan: Comprehensive metabolic panel, CBC with Differential, Vitamin D 25 hydroxy, TSH, Vitamin B12  Insomnia - Plan: zolpidem (AMBIEN CR) 12.5 MG CR tablet  External hemorrhoids - Plan: hydrocortisone (ANUSOL-HC) 2.5 % rectal cream  Rectal bleeding - Plan: CBC with Differential  Urinary frequency - Plan: Urine culture  Osteopenia - Plan: Vitamin D 25 hydroxy  Unspecified vitamin D deficiency - Plan: Vitamin D 25 hydroxy  B12 deficiency - Plan: Vitamin B12  Pure hypercholesterolemia - Plan: Lipid panel  Briefly discussed insomnia, recommended  daily exercise. Since requiring daily ambien, recommend change to the CR given FDA approval and safety for long-term use (whereas plain zolpidem indicated for short-term or intermittent use, and she is using daily).  Advised to schedule mammogram  Trial of anusol [facility] for external hemorrhoids. Advised to contact us for referral to general surgeon if having ongoing problems for more definitive treatment. F/u with GI (in [facility]) per their recommendations for routine surveillance of colon polyps and Barrett's esophagus. Encouraged daily exercise, including weight-bearing exercise at least twice a week. Consider f/u DEXA next year.  Discussed monthly self breast exams and yearly mammograms after the age of 89; at least 30 minutes of aerobic activity at least 5 days/week; proper sunscreen use reviewed; healthy diet, including goals of calcium and vitamin D intake and alcohol recommendations (less than or equal to 1 drink/day) reviewed; regular seatbelt use; changing batteries in smoke detectors.  Immunization recommendations discussed--UTD.  Colonoscopy recommendations reviewed, per Dr. Noe Gens.  (NO AVS GIVEN DUE TO EPIC BEING DOWN AT TIME OF HER VISIT) Prescriptions were handwritten

## 2012-09-26 ENCOUNTER — Encounter: Payer: Self-pay | Admitting: Family Medicine

## 2012-09-26 ENCOUNTER — Other Ambulatory Visit: Payer: Self-pay

## 2012-09-26 DIAGNOSIS — Z1231 Encounter for screening mammogram for malignant neoplasm of breast: Secondary | ICD-10-CM

## 2012-09-26 DIAGNOSIS — E785 Hyperlipidemia, unspecified: Secondary | ICD-10-CM | POA: Insufficient documentation

## 2012-09-26 LAB — COMPREHENSIVE METABOLIC PANEL
ALT: 25 U/L (ref 0–35)
AST: 25 U/L (ref 0–37)
Albumin: 4.2 g/dL (ref 3.5–5.2)
Alkaline Phosphatase: 80 U/L (ref 39–117)
Calcium: 9.5 mg/dL (ref 8.4–10.5)
Chloride: 106 mEq/L (ref 96–112)
Potassium: 4.2 mEq/L (ref 3.5–5.3)

## 2012-09-26 LAB — CBC WITH DIFFERENTIAL/PLATELET
Basophils Absolute: 0 10*3/uL (ref 0.0–0.1)
Basophils Relative: 0 % (ref 0–1)
Eosinophils Absolute: 0 10*3/uL (ref 0.0–0.7)
HCT: 41.7 % (ref 36.0–46.0)
MCH: 28.9 pg (ref 26.0–34.0)
MCHC: 34.5 g/dL (ref 30.0–36.0)
Monocytes Absolute: 0.5 10*3/uL (ref 0.1–1.0)
Neutro Abs: 1.9 10*3/uL (ref 1.7–7.7)
Neutrophils Relative %: 42 % — ABNORMAL LOW (ref 43–77)
RDW: 14.6 % (ref 11.5–15.5)

## 2012-09-26 LAB — LIPID PANEL
HDL: 29 mg/dL — ABNORMAL LOW (ref >39)
LDL Cholesterol: 95 mg/dL (ref 0–99)
VLDL: 67 mg/dL — ABNORMAL HIGH (ref 0–40)

## 2012-09-26 LAB — TSH: TSH: 2.739 u[IU]/mL (ref 0.350–4.500)

## 2012-09-27 LAB — POCT URINALYSIS DIPSTICK
Ketones, UA: NEGATIVE
Protein, UA: NEGATIVE

## 2012-09-27 LAB — URINE CULTURE: Colony Count: 40000

## 2012-09-27 NOTE — Addendum Note (Signed)
Addended by: Debbrah Alar F on: 09/27/2012 08:59 AM   Modules accepted: Orders

## 2012-10-02 ENCOUNTER — Telehealth (INDEPENDENT_AMBULATORY_CARE_PROVIDER_SITE_OTHER): Payer: Self-pay

## 2012-10-02 NOTE — Telephone Encounter (Signed)
Pt called stating she was seen 4 years ago and requests appt to discuss treatment of her hemorrhoids. She states she has been putting off doing any surgery because she has disabled daughter but now is ready to discuss options. Pt sent to front desk to make appt.

## 2012-10-11 ENCOUNTER — Ambulatory Visit
Admission: RE | Admit: 2012-10-11 | Discharge: 2012-10-11 | Disposition: A | Discharge status: Home / Self Care | Payer: BC Managed Care – PPO | Source: Ambulatory Visit

## 2012-10-11 DIAGNOSIS — Z1231 Encounter for screening mammogram for malignant neoplasm of breast: Secondary | ICD-10-CM

## 2012-10-12 LAB — HM MAMMOGRAPHY: HM Mammogram: NORMAL

## 2012-10-16 ENCOUNTER — Ambulatory Visit (INDEPENDENT_AMBULATORY_CARE_PROVIDER_SITE_OTHER): Payer: BC Managed Care – PPO | Admitting: Surgery

## 2012-10-16 ENCOUNTER — Encounter (INDEPENDENT_AMBULATORY_CARE_PROVIDER_SITE_OTHER): Payer: Self-pay | Admitting: Surgery

## 2012-10-16 VITALS — BP 130/78 | HR 64 | Resp 16 | Ht 64.5 in | Wt 155.2 lb

## 2012-10-16 DIAGNOSIS — K648 Other hemorrhoids: Secondary | ICD-10-CM

## 2012-10-16 HISTORY — DX: Other hemorrhoids: K64.8

## 2012-10-16 NOTE — Patient Instructions (Addendum)
See the Handout(s) we gave you.  Consider surgery.  Please call our office at [phone] if you wish to schedule surgery or if you have further questions / concerns.   HEMORRHOIDS  The rectum is the last foot of your colon, and it naturally stretches to hold stool.  Hemorrhoidal piles are natural clusters of blood vessels that help the rectum and anal canal stretch to hold stool and allow bowel movements to eliminate feces.   Hemorrhoids are abnormally swollen blood vessels in the rectum.  Too much pressure in the rectum causes hemorrhoids by forcing blood to stretch and bulge the walls of the veins, sometimes even rupturing them.  Hemorrhoids can become like varicose veins you might see on a person's legs.  Most people will develop a flare of hemorrhoids in their lifetime.  When bulging hemorrhoidal veins are irritated, they can swell, burn, itch, cause pain, and bleed.  Most flares will calm down gradually own within a few weeks.  However, once hemorrhoids are created, they are difficult to get rid of completely and tend to flare more easily than the first flare.   Fortunately, good habits and simple medical treatment usually control hemorrhoids well, and surgery is needed only in severe cases. Types of Hemorrhoids:  Internal hemorrhoids usually don't initially hurt or itch; they are deep inside the rectum and usually have no sensation. If they begin to push out (prolapse), pain and burning can occur.  However, internal hemorrhoids can bleed.  Anal bleeding should not be ignored since bleeding could come from a dangerous source like colorectal cancer, so persistent rectal bleeding should be investigated by a doctor, sometimes with a colonoscopy.  External hemorrhoids cause most of the symptoms - pain, burning, and itching. Nonirritated hemorrhoids can look like small skin tags coming out of the anus.   Thrombosed hemorrhoids can form when a hemorrhoid blood vessel bursts and causes the hemorrhoid to  suddenly swell.  A purple blood clot can form in it and become an excruciatingly painful lump at the anus. Because of these unpleasant symptoms, immediate incision and drainage by a surgeon at an office visit can provide much relief of the pain.    PREVENTION Avoiding the most frequent causes listed below will prevent most cases of hemorrhoids: Constipation Hard stools Diarrhea  Constant sitting  Straining with bowel movements Sitting on the toilet for a long time  Severe coughing  episodes Pregnancy / Childbirth  Heavy Lifting  Sometimes avoiding the above triggers is difficult:  How can you avoid sitting all day if you have a seated job? Also, we try to avoid coughing and diarrhea, but sometimes it's beyond your control.  Still, there are some practical hints to help: Keep the anal and genital area clean.  Moistened tissues such as flushable wet wipes are less irritating than toilet paper.  Using irrigating showers or bottle irrigation washing gently cleans this sensitive area.   Avoid dry toilet paper when cleaning after bowel movements.  [name] Keep the anal and genital area dry.  Lightly pat the rectal area dry.  Avoid rubbing.  Talcum or baby powders can help GET YOUR STOOLS SOFT.   This is the most important way to prevent irritated hemorrhoids.  Hard stools are like sandpaper to the anorectal canal and will cause more problems.  The goal: ONE SOFT BOWEL MOVEMENT A DAY!  BMs from every other day to 3 times a day is a tolerable range Treat coughing, diarrhea and constipation early since irritated  hemorrhoids may soon follow.  If your main job activity is seated, always stand or walk during your breaks. Make it a point to stand and walk at least 5 minutes every hour and try to shift frequently in your chair to avoid direct rectal pressure.  Always exhale as you strain or lift. Don't hold your breath.  Do not delay or try to prevent a bowel movement when the urge is present. Exercise regularly  (walking or jogging 60 minutes a day) to stimulate the bowels to move. No reading or other activity while on the toilet. If bowel movements take longer than 5 minutes, you are too constipated. AVOID CONSTIPATION Drink plenty of liquids (1 1/2 to 2 quarts of water and other fluids a day unless fluid restricted for another medical condition). Liquids that contain caffeine (coffee a, tea, soft drinks) can be dehydrating and should be avoided until constipation is controlled. Consider minimizing milk, as dairy products may be constipating. Eat plenty of fiber (30g a day ideal, more if needed).  Fiber is the undigested part of plant food that passes into the colon, acting as "natures broom" to encourage bowel motility and movement.  Fiber can absorb and hold large amounts of water. This results in a larger, bulkier stool, which is soft and easier to pass.  Eating foods high in fiber - 12 servings - such as  Vegetables: Root (potatoes, carrots, turnips), Leafy green (lettuce, salad greens, celery, spinach), High residue (cabbage, broccoli, etc.) Fruit: Fresh, Dried (prunes, apricots, cherries), Stewed (applesauce)  Whole grain breads, pasta, whole wheat Bran cereals, muffins, etc. Consider adding supplemental bulking fiber which retains large volumes of water: Psyllium ground seeds --available as Metamucil, Konsyl, Effersyllium, Per Diem Fiber, or the less expensive generic forms.  Citrucel  (methylcellulose wood fiber) . FiberCon (Polycarbophil) Polyethylene Glycol - and "artificial" fiber commonly called Miralax or Glycolax.  It is helpful for people with gassy or bloated feelings with regular fiber Flax Seed - a less gassy natural fiber  Laxatives can be useful for a short period if constipation is severe Osmotics (Milk of Magnesia, Fleets Phospho-Soda, Magnesium Citrate)  Stimulants (Senokot,   Castor Oil,  Dulcolax, Ex-Lax)    Laxatives are not a good long-term solution as it can stress the bowels  and cause too much mineral loss and dehydration.   Avoid taking laxatives for more than 7 days in a row.  AVOID DIARRHEA Switch to liquids and simpler foods for a few days to avoid stressing your intestines further. Avoid dairy products (especially milk & ice cream) for a short time.  The intestines often can lose the ability to digest lactose when stressed. Avoid foods that cause gassiness or bloating.  Typical foods include beans and other legumes, cabbage, broccoli, and dairy foods.  Every person has some sensitivity to other foods, so listen to your body and avoid those foods that trigger problems for you. Adding fiber (Citrucel, Metamucil, FiberCon, Flax seed, Miralax) gradually can help thicken stools by absorbing excess fluid and retrain the intestines to act more normally.  Slowly increase the dose over a few weeks.  Too much fiber too soon can backfire and cause cramping & bloating. Probiotics (such as active yogurt, Align, etc) may help repopulate the intestines and colon with normal bacteria and calm down a sensitive digestive tract.  Most studies show it to be of mild help, though, and such products can be costly. Medicines: Bismuth subsalicylate (ex. Kayopectate, Pepto Bismol) every 30 minutes for up to  6 doses can help control diarrhea.  Avoid if pregnant. Loperamide (Immodium) can slow down diarrhea.  Start with two tablets (4mg  total) first and then try one tablet every 6 hours.  Avoid if you are having fevers or severe pain.  If you are not better or start feeling worse, stop all medicines and call your doctor for advice Call your doctor if you are getting worse or not better.  Sometimes further testing (cultures, endoscopy, X-ray studies, bloodwork, etc) may be needed to help diagnose and treat the cause of the diarrhea. TREATMENT OF HEMORRHOID FLARE If these preventive measures fail, you must take action right away! Hemorrhoids are one condition that can be mild in the morning and  become intolerable by nightfall. Most hemorrhoidal flares take several weeks to calm down.  These suggestions can help: Warm soaks.  This helps more than any topical medication.  Use up to 8 times a day.  Usually sitz baths or sitting in a warm bathtub helps.  Sitting on moist warm towels are helpful.  Switching to ice packs/cool compresses can be helpful Normalize your bowels.  Extremes of diarrhea or constipation will make hemorrhoids worse.  One soft bowel movement a day is the goal.  Fiber can help get your bowels regular Wet wipes instead of toilet paper Pain control with a NSAID such as ibuprofen (Advil) or naproxen (Aleve) or acetaminophen (Tylenol) around the clock.  Narcotics are constipating and should be minimized if possible Topical creams contain steroids (bydrocortisone) or local anesthetic (xylocaine) can help make pain and itching more tolerable.   EVALUATION If hemorrhoids are still causing problems, you could benefit by an evaluation by a surgeon.  The surgeon will obtain a history and examine you.  If hemorrhoids are diagnosed, some therapies can be offered in the office, usually with an anoscope into the less sensitive area of the rectum: -injection of hemorrhoids (sclerotherapy) can scar the blood vessels of the swollen/enlarged hemorrhoids to help shrink them down to a more normal size -rubber banding of the enlarged hemorrhoids to help shrink them down to a more normal size -drainage of the blood clot causing a thrombosed hemorrhoid,  to relieve the severe pain   While 90% of the time such problems from hemorrhoids can be managed without preceding to surgery, sometimes the hemorrhoids require a operation to control the problem (uncontrolled bleeding, prolapse, pain, etc.).   This involves being placed under general anesthesia where the surgeon can confirm the diagnosis and remove, suture, or staple the hemorrhoid(s).  Your surgeon can help you treat the problem appropriately.     ANORECTAL SURGERY: POST OP INSTRUCTIONS  1. Take your usually prescribed home medications unless otherwise directed. 2. DIET: Follow a light bland diet the first 24 hours after arrival home, such as soup, liquids, crackers, etc.  Be sure to include lots of fluids daily.  Avoid fast food or heavy meals as your are more likely to get nauseated.  Eat a low fat the next few days after surgery.   3. PAIN CONTROL: a. Pain is best controlled by a usual combination of three different methods TOGETHER: i. Ice/Heat ii. Over the counter pain medication iii. Prescription pain medication b. Most patients will experience some swelling and discomfort in the anus/rectal area. and incisions.  Ice packs or heat (30-60 minutes up to 6 times a day) will help. Use ice for the first few days to help decrease swelling and bruising, then switch to heat such as warm towels, sitz  baths, warm baths, etc to help relax tight/sore spots and speed recovery.  Some people prefer to use ice alone, heat alone, alternating between ice & heat.  Experiment to what works for you.  Swelling and bruising can take several weeks to resolve.   c. It is helpful to take an over-the-counter pain medication regularly for the first few weeks.  Choose one of the following that works best for you: i. Naproxen (Aleve, etc)  Two 220mg  tabs twice a day ii. Ibuprofen (Advil, etc) Three 200mg  tabs four times a day (every meal & bedtime) iii. Acetaminophen (Tylenol, etc) 500-650mg  four times a day (every meal & bedtime) d. A  prescription for pain medication (such as oxycodone, hydrocodone, etc) should be given to you upon discharge.  Take your pain medication as prescribed.  i. If you are having problems/concerns with the prescription medicine (does not control pain, nausea, vomiting, rash, itching, etc), please call us [phone] to see if we need to switch you to a different pain medicine that will work better for you and/or control your side  effect better. ii. If you need a refill on your pain medication, please contact your pharmacy.  They will contact our office to request authorization. Prescriptions will not be filled after 5 pm or on week-ends. 4. KEEP YOUR BOWELS REGULAR a. The goal is one bowel movement a day b. Avoid getting constipated.  Between the surgery and the pain medications, it is common to experience some constipation.  Increasing fluid intake and taking a fiber supplement (such as Metamucil, Citrucel, FiberCon, MiraLax, etc) 1-2 times a day regularly will usually help prevent this problem from occurring.  A mild laxative (prune juice, Milk of Magnesia, MiraLax, etc) should be taken according to package directions if there are no bowel movements after 48 hours. c. Watch out for diarrhea.  If you have many loose bowel movements, simplify your diet to bland foods & liquids for a few days.  Stop any stool softeners and decrease your fiber supplement.  Switching to mild anti-diarrheal medications (Kayopectate, Pepto Bismol) can help.  If this worsens or does not improve, please call us.  5. Wound Care a. Remove your bandages the day after surgery.  Unless discharge instructions indicate otherwise, leave your bandage dry and in place overnight.  Remove the bandage during your first bowel movement.   b. Allow the wound packing to fall out over the next few days.  You can trim exposed gauze / ribbon as it falls out.  You do not need to repack the wound unless instructed otherwise.  Wear an absorbent pad or soft cotton gauze in your underwear as needed to catch any drainage and help keep the area  c. Keep the area clean and dry.  Bathe / shower every day.  Keep the area clean by showering / bathing over the incision / wound.   It is okay to soak an open wound to help wash it.  Wet wipes or showers / gentle washing after bowel movements is often less traumatic than regular toilet paper. d. Bonita Quin may have some styrofoam-like soft  packing in the rectum which will come out with the first bowel movement.  e. You will often notice bleeding with bowel movements.  This should slow down by the end of the first week of surgery f. Expect some drainage.  This should slow down, too, by the end of the first week of surgery.  Wear an absorbent pad or soft cotton gauze  in your underwear until the drainage stops. 6. ACTIVITIES as tolerated:   a. You may resume regular (light) daily activities beginning the next day-such as daily self-care, walking, climbing stairs-gradually increasing activities as tolerated.  If you can walk 30 minutes without difficulty, it is safe to try more intense activity such as jogging, treadmill, bicycling, low-impact aerobics, swimming, etc. b. Save the most intensive and strenuous activity for last such as sit-ups, heavy lifting, contact sports, etc  Refrain from any heavy lifting or straining until you are off narcotics for pain control.   c. DO NOT PUSH THROUGH PAIN.  Let pain be your guide: If it hurts to do something, don't do it.  Pain is your body warning you to avoid that activity for another week until the pain goes down. d. You may drive when you are no longer taking prescription pain medication, you can comfortably sit for long periods of time, and you can safely maneuver your car and apply brakes. e. Bonita Quin may have sexual intercourse when it is comfortable.  7. FOLLOW UP in our office a. Please call [facility] at [phone] to set up an appointment to see your surgeon in the office for a follow-up appointment approximately 2 weeks after your surgery. b. Make sure that you call for this appointment the day you arrive home to insure a convenient appointment time. 10. IF YOU HAVE DISABILITY OR FAMILY LEAVE FORMS, BRING THEM TO THE OFFICE FOR PROCESSING.  DO NOT GIVE THEM TO YOUR DOCTOR.        WHEN TO CALL us [phone]: 1. Poor pain control 2. Reactions / problems with new medications  (rash/itching, nausea, etc)  3. Fever over 101.5 F (38.5 C) 4. Inability to urinate 5. Nausea and/or vomiting 6. Worsening swelling or bruising 7. Continued bleeding from incision. 8. Increased pain, redness, or drainage from the incision  The clinic staff is available to answer your questions during regular business hours (8:30am-5pm).  Please don't hesitate to call and ask to speak to one of our nurses for clinical concerns.   A surgeon from [facility] is always on call at the hospitals   If you have a medical emergency, go to the nearest emergency room or call 911.    [facility] [address], [place], [place]  [address] ? MAIN: [phone] ? TOLL FREE: [phone] ? FAX [phone] [email or web address]

## 2012-10-16 NOTE — Progress Notes (Signed)
Subjective:     Patient ID: Anna Mckinney, female   DOB: 1950/11/21, 62 y.o.   MRN: [id number]  HPI  Anna Mckinney  1950-09-29 [id number]  Patient Care Team: Ronnald Nian, MD as PCP - General (Family Medicine) Joselyn Arrow, MD as Attending Physician (Family Medicine) Griffith Citron, MD as Consulting Physician (Gastroenterology)  This patient is a 62 y.o.female who presents today for surgical evaluation at the request of Dr. Lynelle Doctor.   Reason for visit: Rectal bleeding and pain.  Worsening hemorrhoids.  Pleasant woman that history with hemorrhoids for many years.  She has had bandings done in the past.  She saw my partner, Dr. Abbey Chatters, in 2007.  He recommended hemorrhoid resection.  She was hesitant to do that.  I saw her in 2010 and did cholecystectomy for biliary dyskinesia.  She is recovered from that well.  Did not want anything done for hemorrhoids.  She has a bowel movement every other day to twice a day.  Some irregularity but overall no extremes.  She used to have to do a lot of heavy lifting To take care of her debilitated child.  Her husband is more of that.  She still struggles with hemorrhoid problems.  Bleeding is frequent.  Very hard to keep the area clean.  Has to do soaks 4-6 times a day.  Has had bandings by her gastroenterologist, Dr. Kinnie Scales, as recent as last year.  Still struggling.  She wished to see if surgery could be reconsidered.  Her primary care physician, sent the patient to [place] to see if that was still possible.  Patient Active Problem List   Diagnosis Date Noted  . Dyslipidemia 09/26/2012  . GERD (gastroesophageal reflux disease) 07/24/2012  . Barrett's esophagus 07/24/2012  . Cough 12/22/2011  . Osteopenia 07/27/2010  . Pure hypercholesterolemia 07/27/2010    Past Medical History  Diagnosis Date  . Barrett's esophagus     last EGD 2011 in [facility]  . External hemorrhoids 2012    getting banding every other week with Dr. Kinnie Scales   . Osteopenia   .  Impaired fasting glucose   . Increased serum lipids   . Hyperlipidemia     Past Surgical History  Procedure Laterality Date  . Cholecystectomy  2010  . Abdominal hysterectomy  1984    1 ovary remains; removed for "cancer cells"    History   Social History  . Marital Status: Married    Spouse Name: N/A    Number of Children: 2  . Years of Education: N/A   Occupational History  . 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 Smoking status: Former Smoker -- 0.30 packs/day for 20 years    Types: Cigarettes    Quit date: 01/24/2009  . Smokeless tobacco: Never Used  . Alcohol Use: No  . Drug Use: No  . Sexually Active: Not on file   Other Topics Concern  . Not on file   Social History Narrative  . No narrative on file    Family History  Problem Relation Age of Onset  . Cancer Mother 77    breast cancer  . COPD Father   . Heart disease Father     CHF  . Atrial fibrillation Father   . Angelman syndrome Daughter   . Seizures Daughter   . Cancer Paternal Grandmother     stomach  . Thyroid disease Sister     Current Outpatient Prescriptions  Medication  Sig Dispense Refill  . Ascorbic Acid (VITAMIN C) 100 MG tablet Take 100 mg by mouth daily.        . Calcium Carbonate-Vitamin D (CALCIUM + D PO) Take by mouth 2 (two) times daily.        . cholecalciferol (VITAMIN D) 1000 UNITS tablet Take 2,000 Units by mouth daily.       [name] dexlansoprazole (DEXILANT) 60 MG capsule Take 1 capsule (60 mg total) by mouth daily.  30 capsule  11  . hydrocortisone (ANUSOL-HC) 2.5 % rectal cream Place rectally 2 (two) times daily.  60 g  1  . Probiotic Product (SOLUBLE FIBER/PROBIOTICS PO) Take 1 tablet by mouth daily.       [name] Specialty Vitamins Products (MAGNESIUM, AMINO ACID CHELATE,) 133 MG tablet Take 1 tablet by mouth 2 (two) times daily.      [name] zolpidem (AMBIEN CR) 12.5 MG CR tablet Take 1 tablet (12.5 mg total) by mouth at bedtime as needed for sleep.  30 tablet  2    . traMADol (ULTRAM) 50 MG tablet 1-2 every 4 hours as needed for cough or pain  40 tablet  0   No current facility-administered medications for this visit.     Allergies  Allergen Reactions  . Codeine   . Vicodin (Hydrocodone-Acetaminophen) Itching and Nausea And Vomiting    BP 130/78  Pulse 64  Resp 16  Ht 5' 4.5" (1.638 m)  Wt 155 lb 3.2 oz (70.398 kg)  BMI 26.24 kg/m2  Mm Digital Screening  10/12/2012   *RADIOLOGY REPORT*  Clinical Data: Screening.  DIGITAL SCREENING BILATERAL MAMMOGRAM WITH CAD DIGITAL BREAST TOMOSYNTHESIS  Digital breast tomosynthesis images are acquired in two projections.  These images are reviewed in combination with the digital mammogram, confirming the findings below.  Comparison:  Previous exam(s).  FINDINGS:  ACR Breast Density Category c:  The breast tissue is heterogeneously dense, which may obscure small masses.  There are no findings suspicious for malignancy.  Images were processed with CAD.  IMPRESSION: No mammographic evidence of malignancy.  A result letter of this screening mammogram will be mailed directly to the patient.  RECOMMENDATION: Screening mammogram in one year. (Code:SM-B-01Y)  BI-RADS CATEGORY 1:  Negative.   Original Report Authenticated By: Edwin Cap, M.D.     Review of Systems  Constitutional: Negative for fever, chills, diaphoresis, appetite change and fatigue.  HENT: Negative for ear pain, sore throat, trouble swallowing, neck pain and ear discharge.   Eyes: Negative for photophobia, discharge and visual disturbance.  Respiratory: Negative for cough, choking, chest tightness and shortness of breath.   Cardiovascular: Negative for chest pain and palpitations.  Gastrointestinal: Negative for nausea, vomiting, abdominal pain, diarrhea, constipation, anal bleeding and rectal pain.  Endocrine: Negative for cold intolerance and heat intolerance.  Genitourinary: Negative for dysuria, frequency and difficulty urinating.   Musculoskeletal: Negative for myalgias and gait problem.  Skin: Negative for color change, pallor and rash.  Allergic/Immunologic: Negative for environmental allergies, food allergies and immunocompromised state.  Neurological: Negative for dizziness, speech difficulty, weakness and numbness.  Hematological: Negative for adenopathy.  Psychiatric/Behavioral: Negative for confusion and agitation. The patient is not nervous/anxious.        Objective:   Physical Exam  Constitutional: She is oriented to person, place, and time. She appears well-developed and well-nourished. No distress.  HENT:  Head: Normocephalic.  Mouth/Throat: Oropharynx is clear and moist. No oropharyngeal exudate.  Eyes: Conjunctivae and EOM are normal. Pupils are equal, round,  and reactive to light. No scleral icterus.  Neck: Normal range of motion. Neck supple. No tracheal deviation present.  Cardiovascular: Normal rate, regular rhythm and intact distal pulses.   Pulmonary/Chest: Effort normal and breath sounds normal. No stridor. No respiratory distress. She exhibits no tenderness.  Abdominal: Soft. She exhibits no distension and no mass. There is no tenderness. A hernia is present. Hernia confirmed positive in the ventral area. Hernia confirmed negative in the right inguinal area and confirmed negative in the left inguinal area.    Genitourinary: No vaginal discharge found.  Exam done with assistance of female Medical Assistant in the room.  Perianal skin clean with good hygiene.  No pruritis.   No pilonidal disease.  No fissure.  No abscess/fistula.    Circumferential hemorrhoidal prolapse with large skin folds.  Consistent with stage IV hemorrhoids x3 piles  Musculoskeletal: Normal range of motion. She exhibits no tenderness.       Right elbow: She exhibits normal range of motion.       Left elbow: She exhibits normal range of motion.       Right wrist: She exhibits normal range of motion.       Left wrist:  She exhibits normal range of motion.       Right hand: Normal strength noted.       Left hand: Normal strength noted.  Lymphadenopathy:       Head (right side): No posterior auricular adenopathy present.       Head (left side): No posterior auricular adenopathy present.    She has no cervical adenopathy.    She has no axillary adenopathy.       Right: No inguinal adenopathy present.       Left: No inguinal adenopathy present.  Neurological: She is alert and oriented to person, place, and time. No cranial nerve deficit. She exhibits normal muscle tone. Coordination normal.  Skin: Skin is warm and dry. No rash noted. She is not diaphoretic. No erythema.  Psychiatric: She has a normal mood and affect. Her behavior is normal. Judgment and thought content normal.       Assessment:     Chronically prolapsed hemorrhoids x3 piles.     Plan:     I think she is exhausted all other options.  Bandings no longer work.  She is ready to consider surgery.  I think she is a good candidate for [facility] with possible partial hemorrhoidectomies as well:  The anatomy & physiology of the anorectal region was discussed.  The pathophysiology of hemorrhoids and differential diagnosis was discussed.  Natural history risks without surgery was discussed.   I stressed the importance of a bowel regimen to have daily soft bowel movements to minimize progression of disease.  Interventions such as sclerotherapy & banding were discussed.  The patient's symptoms are not adequately controlled by medicines and other non-operative treatments.  I feel the risks & problems of no surgery outweigh the operative risks; therefore, I recommended surgery to treat the hemorrhoids by ligation, pexy, and possible resection.  Risks such as bleeding, infection, need for further treatment, heart attack, death, and other risks were discussed.   I noted a good likelihood this will help address the problem.  Goals of post-operative recovery were  discussed as well.  Possibility that this will not correct all symptoms was explained.  Post-operative pain, bleeding, constipation, and other problems after surgery were discussed.  We will work to minimize complications.   Educational handouts further explaining  the pathology, treatment options, and bowel regimen were given as well.  Questions were answered.  The patient expresses understanding & wishes to proceed with surgery.

## 2012-10-18 ENCOUNTER — Encounter (INDEPENDENT_AMBULATORY_CARE_PROVIDER_SITE_OTHER): Payer: Self-pay

## 2012-10-20 ENCOUNTER — Other Ambulatory Visit (INDEPENDENT_AMBULATORY_CARE_PROVIDER_SITE_OTHER): Payer: Self-pay | Admitting: Surgery

## 2012-10-20 DIAGNOSIS — K648 Other hemorrhoids: Secondary | ICD-10-CM

## 2012-10-20 DIAGNOSIS — K644 Residual hemorrhoidal skin tags: Secondary | ICD-10-CM

## 2012-10-20 HISTORY — PX: TRANSANAL HEMORRHOIDAL DEARTERIALIZATION: SHX6136

## 2012-10-20 HISTORY — PX: OTHER SURGICAL HISTORY: SHX169

## 2012-10-21 ENCOUNTER — Telehealth (INDEPENDENT_AMBULATORY_CARE_PROVIDER_SITE_OTHER): Payer: Self-pay | Admitting: Surgery

## 2012-10-21 NOTE — Telephone Encounter (Signed)
Son, [name], called. He said that is mother gets sick with any pain meds and wants me to call in something for nausea.  Other than the nausea, she is doing okay.  I called in Zofran - 4 mg - #15 - no refills to [facility] [phone]).  Anna Mckinney

## 2012-10-22 ENCOUNTER — Other Ambulatory Visit (INDEPENDENT_AMBULATORY_CARE_PROVIDER_SITE_OTHER): Payer: Self-pay | Admitting: Surgery

## 2012-10-22 MED ORDER — MAGIC MOUTHWASH: 15.0000 mL | Freq: Four times a day (QID) | ORAL | Status: DC

## 2012-10-22 NOTE — Progress Notes (Signed)
Patient called complaining of thrush on tongue after surgery this past week.  Rx placed for Magic Mouthwash QID for 7 days.  Velora Heckler, MD, [facility] Office: [phone]

## 2012-10-23 NOTE — Telephone Encounter (Signed)
States her nausea is better but she having urgency of urination  Advised to call PCP to be evaluated for possible UTI. Patient verbalized understanding

## 2012-10-24 ENCOUNTER — Other Ambulatory Visit (INDEPENDENT_AMBULATORY_CARE_PROVIDER_SITE_OTHER): Payer: BC Managed Care – PPO

## 2012-10-24 ENCOUNTER — Telehealth: Payer: Self-pay | Admitting: Family Medicine

## 2012-10-24 ENCOUNTER — Telehealth (INDEPENDENT_AMBULATORY_CARE_PROVIDER_SITE_OTHER): Payer: Self-pay

## 2012-10-24 DIAGNOSIS — R350 Frequency of micturition: Secondary | ICD-10-CM

## 2012-10-24 LAB — POCT URINALYSIS DIPSTICK
Nitrite, UA: POSITIVE
Urobilinogen, UA: NEGATIVE
pH, UA: 7

## 2012-10-24 MED ORDER — SULFAMETHOXAZOLE-TMP DS 800-160 MG PO TABS: 1.0000 | ORAL_TABLET | Freq: Two times a day (BID) | ORAL | Status: DC

## 2012-10-24 MED ORDER — CIPROFLOXACIN HCL 500 MG PO TABS: 500.0000 mg | ORAL_TABLET | Freq: Two times a day (BID) | ORAL | Status: AC

## 2012-10-24 NOTE — Telephone Encounter (Signed)
Pt calling in requesting something for a UTI. The pt called into triage yesterday and we advised her to go to her pcp Dr Jola Babinski office to get a UA. The pt dropped off a urine specimen this am and they did a dipstick which is showing the beginning signs of a UTI. The pt states that she is having bladder spasms that is unbearable. The pt states that her bottom is not hurting her it's the bladder that is causing so much discomfort. I advised pt that I would have to check with Dr Michaell Cowing and call her back.

## 2012-10-24 NOTE — Progress Notes (Signed)
  Subjective:    Patient ID: Anna Mckinney, female    DOB: 03/16/1950, 62 y.o.   MRN: [id number]  HPI    Review of Systems     Objective:   Physical Exam        Assessment & Plan:  She has been having some frequency and dysuria. Her urinalysis was positive for nitrites. I will call in Septra.

## 2012-10-24 NOTE — Telephone Encounter (Signed)
Pt called she had hemorrhoid surgery and prolapse rectum on Friday.  She is having a lot of discomfort today and the er nurse she talked with advised probably UTI.  Pt husband has brought in urine.  Pt has taken oxycodone for the pain and is having no relief.  She wants something called in asap to [employer].  Please advise pt

## 2012-10-24 NOTE — Telephone Encounter (Signed)
I TALKED TO PATIENT EXPLAINED WE WERE BOOKED TODAY THAT DR.LALONDE MAY BE ABLE TO GET TO RESULTS AFTER LUNCH SHE CONTINUED TO SAY SHE WAS IN PAIN (FEELS LIKE SOMEONE HAS STUCK THEIR FIST UP HER WHO-HA) PATIENT HAS HAD RECTAL SURGERY I LET HER KNOW A UTI SHOULD NOT CAUSE THAT KIND OF PAIN BECAUSE HER UA ONLY SHOWED A START OF A UTI AND SHE WOULD ONLY BE GETTING AN ANTIBIOTIC AND THAT WOULDN'T START TO WORK [place] I THOUGHT THAT SHE REALLY NEEDED TO CALL SURGEON OFFICE OR GO TO ER BECAUSE HER PAIN WAS SO BAD THAT OXYCODONE WANT TOUCH HER PAIN PATIENT SAID SHE WOULD CALL SURGEONS OFFICE.

## 2012-10-24 NOTE — Telephone Encounter (Signed)
Pt advised that we ordered Cipro 500mg  BID x 5 days, pt needs to be getting in the bath tub 6-8 times a day, drink cranberry juice, and take some probiotics. The pt is already taking the probiotics and doing the warm soakes. I advised pt that if she is not better after taking the Cipro in the next 24hrs she needs to call us back to let us know b/c she might need to be seen. The pt understands.

## 2012-10-24 NOTE — Telephone Encounter (Signed)
Called pt to inform her med was sent in

## 2012-10-25 ENCOUNTER — Encounter: Payer: Self-pay | Admitting: *Deleted

## 2012-10-25 ENCOUNTER — Other Ambulatory Visit (INDEPENDENT_AMBULATORY_CARE_PROVIDER_SITE_OTHER): Payer: Self-pay | Admitting: *Deleted

## 2012-10-25 MED ORDER — OXYCODONE HCL 5 MG PO TABS: 5.0000 mg | ORAL_TABLET | ORAL | Status: DC | PRN

## 2012-11-01 ENCOUNTER — Encounter: Payer: Self-pay | Admitting: Family Medicine

## 2012-11-01 ENCOUNTER — Telehealth (INDEPENDENT_AMBULATORY_CARE_PROVIDER_SITE_OTHER): Payer: Self-pay

## 2012-11-01 DIAGNOSIS — G8918 Other acute postprocedural pain: Secondary | ICD-10-CM

## 2012-11-01 NOTE — Telephone Encounter (Signed)
Pt calling in b/c she feels she has external hemorrhoids still after having hemorrhoid surgery 2 wks a go by Dr Michaell Cowing. The pt has had a lot of pain with the surgery and she has not been taking her pain medicine Oxycodone b/c it made her nauseated. The pt is not able to take antinflammatories due to her ulcers. The pt has taken some Tramadol but now she needs a refill on the Rx can you please ok the refill. Pls advise. The pt has a f/u appt with Dr Michaell Cowing on 9/2.

## 2012-11-02 MED ORDER — TRAMADOL HCL 50 MG PO TABS: ORAL_TABLET | ORAL | Status: DC

## 2012-11-02 NOTE — Telephone Encounter (Signed)
The pt called back.  I gave her Dr Michaell Cowing' advice.  The Tramadol has not been called in yet so I will call in to [facility] [phone] Tramadol 50 mg po 1-2 q 6 hrs prn #50

## 2012-11-02 NOTE — Telephone Encounter (Signed)
OK to refill tramadol 50mg  1-2 PO q6h PRN pain #50.    Recommended pt take tylenol 1 g (500mg  ext str tabs x2) TID round the clock to help lower the pain & decrease the need for narcotics.  Warm soaks 6x/day  Swelling at the anus is expected after hem surgery & will take a few months to calm down to get a sense of what the new anatomy is.

## 2012-11-07 ENCOUNTER — Encounter (INDEPENDENT_AMBULATORY_CARE_PROVIDER_SITE_OTHER): Payer: Self-pay | Admitting: Surgery

## 2012-11-07 ENCOUNTER — Ambulatory Visit (INDEPENDENT_AMBULATORY_CARE_PROVIDER_SITE_OTHER): Payer: BC Managed Care – PPO | Admitting: Surgery

## 2012-11-07 VITALS — BP 128/82 | HR 94 | Temp 97.3°F | Resp 18 | Ht 64.5 in | Wt 146.8 lb

## 2012-11-07 DIAGNOSIS — K644 Residual hemorrhoidal skin tags: Secondary | ICD-10-CM | POA: Insufficient documentation

## 2012-11-07 NOTE — Patient Instructions (Addendum)
ANORECTAL SURGERY: POST OP INSTRUCTIONS  1. Take your usually prescribed home medications unless otherwise directed. 2. DIET: Follow a light bland diet the first 24 hours after arrival home, such as soup, liquids, crackers, etc.  Be sure to include lots of fluids daily.  Avoid fast food or heavy meals as your are more likely to get nauseated.  Eat a low fat the next few days after surgery.   3. PAIN CONTROL: a. Pain is best controlled by a usual combination of three different methods TOGETHER: i. Ice/Heat ii. Over the counter pain medication iii. Prescription pain medication b. Most patients will experience some swelling and discomfort in the anus/rectal area. and incisions.  Ice packs or heat (30-60 minutes up to 6 times a day) will help. Use ice for the first few days to help decrease swelling and bruising, then switch to heat such as warm towels, sitz baths, warm baths, etc to help relax tight/sore spots and speed recovery.  Some people prefer to use ice alone, heat alone, alternating between ice & heat.  Experiment to what works for you.  Swelling and bruising can take several weeks to resolve.   c. It is helpful to take an over-the-counter pain medication regularly for the first few weeks.  Choose one of the following that works best for you: i. Naproxen (Aleve, etc)  Two 220mg tabs twice a day ii. Ibuprofen (Advil, etc) Three 200mg tabs four times a day (every meal & bedtime) iii. Acetaminophen (Tylenol, etc) 500-650mg four times a day (every meal & bedtime) d. A  prescription for pain medication (such as oxycodone, hydrocodone, etc) should be given to you upon discharge.  Take your pain medication as prescribed.  i. If you are having problems/concerns with the prescription medicine (does not control pain, nausea, vomiting, rash, itching, etc), please call us [phone] to see if we need to switch you to a different pain medicine that will work better for you and/or control your side effect  better. ii. If you need a refill on your pain medication, please contact your pharmacy.  They will contact our office to request authorization. Prescriptions will not be filled after 5 pm or on week-ends. 4. KEEP YOUR BOWELS REGULAR a. The goal is one bowel movement a day b. Avoid getting constipated.  Between the surgery and the pain medications, it is common to experience some constipation.  Increasing fluid intake and taking a fiber supplement (such as Metamucil, Citrucel, FiberCon, MiraLax, etc) 1-2 times a day regularly will usually help prevent this problem from occurring.  A mild laxative (prune juice, Milk of Magnesia, MiraLax, etc) should be taken according to package directions if there are no bowel movements after 48 hours. c. Watch out for diarrhea.  If you have many loose bowel movements, simplify your diet to bland foods & liquids for a few days.  Stop any stool softeners and decrease your fiber supplement.  Switching to mild anti-diarrheal medications (Kayopectate, Pepto Bismol) can help.  If this worsens or does not improve, please call us.  5. Wound Care a. Remove your bandages the day after surgery.  Unless discharge instructions indicate otherwise, leave your bandage dry and in place overnight.  Remove the bandage during your first bowel movement.   b. Allow the wound packing to fall out over the next few days.  You can trim exposed gauze / ribbon as it falls out.  You do not need to repack the wound unless instructed otherwise.  Wear an   absorbent pad or soft cotton gauze in your underwear as needed to catch any drainage and help keep the area  c. Keep the area clean and dry.  Bathe / shower every day.  Keep the area clean by showering / bathing over the incision / wound.   It is okay to soak an open wound to help wash it.  Wet wipes or showers / gentle washing after bowel movements is often less traumatic than regular toilet paper. d. Bonita Quin may have some styrofoam-like soft packing in  the rectum which will come out with the first bowel movement.  e. You will often notice bleeding with bowel movements.  This should slow down by the end of the first week of surgery f. Expect some drainage.  This should slow down, too, by the end of the first week of surgery.  Wear an absorbent pad or soft cotton gauze in your underwear until the drainage stops. 6. ACTIVITIES as tolerated:   a. You may resume regular (light) daily activities beginning the next day-such as daily self-care, walking, climbing stairs-gradually increasing activities as tolerated.  If you can walk 30 minutes without difficulty, it is safe to try more intense activity such as jogging, treadmill, bicycling, low-impact aerobics, swimming, etc. b. Save the most intensive and strenuous activity for last such as sit-ups, heavy lifting, contact sports, etc  Refrain from any heavy lifting or straining until you are off narcotics for pain control.   c. DO NOT PUSH THROUGH PAIN.  Let pain be your guide: If it hurts to do something, don't do it.  Pain is your body warning you to avoid that activity for another week until the pain goes down. d. You may drive when you are no longer taking prescription pain medication, you can comfortably sit for long periods of time, and you can safely maneuver your car and apply brakes. e. Bonita Quin may have sexual intercourse when it is comfortable.  7. FOLLOW UP in our office a. Please call [facility] at [phone] to set up an appointment to see your surgeon in the office for a follow-up appointment approximately 2 weeks after your surgery. b. Make sure that you call for this appointment the day you arrive home to insure a convenient appointment time. 10. IF YOU HAVE DISABILITY OR FAMILY LEAVE FORMS, BRING THEM TO THE OFFICE FOR PROCESSING.  DO NOT GIVE THEM TO YOUR DOCTOR.        WHEN TO CALL us [phone]: 1. Poor pain control 2. Reactions / problems with new medications (rash/itching, nausea,  etc)  3. Fever over 101.5 F (38.5 C) 4. Inability to urinate 5. Nausea and/or vomiting 6. Worsening swelling or bruising 7. Continued bleeding from incision. 8. Increased pain, redness, or drainage from the incision  The clinic staff is available to answer your questions during regular business hours (8:30am-5pm).  Please don't hesitate to call and ask to speak to one of our nurses for clinical concerns.   A surgeon from [facility] is always on call at the hospitals   If you have a medical emergency, go to the nearest emergency room or call 911.    [facility] [address], [place], [place]  [address] ? MAIN: [phone] ? TOLL FREE: [phone] ? FAX [phone] [email or web address]   HEMORRHOIDS  The rectum is the last foot of your colon, and it naturally stretches to hold stool.  Hemorrhoidal piles are natural clusters of blood vessels  that help the rectum and anal canal stretch to hold stool and allow bowel movements to eliminate feces.   Hemorrhoids are abnormally swollen blood vessels in the rectum.  Too much pressure in the rectum causes hemorrhoids by forcing blood to stretch and bulge the walls of the veins, sometimes even rupturing them.  Hemorrhoids can become like varicose veins you might see on a person's legs.  Most people will develop a flare of hemorrhoids in their lifetime.  When bulging hemorrhoidal veins are irritated, they can swell, burn, itch, cause pain, and bleed.  Most flares will calm down gradually own within a few weeks.  However, once hemorrhoids are created, they are difficult to get rid of completely and tend to flare more easily than the first flare.   Fortunately, good habits and simple medical treatment usually control hemorrhoids well, and surgery is needed only in severe cases. Types of Hemorrhoids:  Internal hemorrhoids usually don't initially hurt or itch; they are deep inside the rectum  and usually have no sensation. If they begin to push out (prolapse), pain and burning can occur.  However, internal hemorrhoids can bleed.  Anal bleeding should not be ignored since bleeding could come from a dangerous source like colorectal cancer, so persistent rectal bleeding should be investigated by a doctor, sometimes with a colonoscopy.  External hemorrhoids cause most of the symptoms - pain, burning, and itching. Nonirritated hemorrhoids can look like small skin tags coming out of the anus.   Thrombosed hemorrhoids can form when a hemorrhoid blood vessel bursts and causes the hemorrhoid to suddenly swell.  A purple blood clot can form in it and become an excruciatingly painful lump at the anus. Because of these unpleasant symptoms, immediate incision and drainage by a surgeon at an office visit can provide much relief of the pain.    PREVENTION Avoiding the most frequent causes listed below will prevent most cases of hemorrhoids: Constipation Hard stools Diarrhea  Constant sitting  Straining with bowel movements Sitting on the toilet for a long time  Severe coughing  episodes Pregnancy / Childbirth  Heavy Lifting  Sometimes avoiding the above triggers is difficult:  How can you avoid sitting all day if you have a seated job? Also, we try to avoid coughing and diarrhea, but sometimes it's beyond your control.  Still, there are some practical hints to help: Keep the anal and genital area clean.  Moistened tissues such as flushable wet wipes are less irritating than toilet paper.  Using irrigating showers or bottle irrigation washing gently cleans this sensitive area.   Avoid dry toilet paper when cleaning after bowel movements.  [name] Keep the anal and genital area dry.  Lightly pat the rectal area dry.  Avoid rubbing.  Talcum or baby powders can help GET YOUR STOOLS SOFT.   This is the most important way to prevent irritated hemorrhoids.  Hard stools are like sandpaper to the anorectal canal and  will cause more problems.  The goal: ONE SOFT BOWEL MOVEMENT A DAY!  BMs from every other day to 3 times a day is a tolerable range Treat coughing, diarrhea and constipation early since irritated hemorrhoids may soon follow.  If your main job activity is seated, always stand or walk during your breaks. Make it a point to stand and walk at least 5 minutes every hour and try to shift frequently in your chair to avoid direct rectal pressure.  Always exhale as you strain or lift. Don't hold your breath.  Do not delay or try to prevent a bowel movement when the urge is present. Exercise regularly (walking or jogging 60 minutes a day) to stimulate the bowels to move. No reading or other activity while on the toilet. If bowel movements take longer than 5 minutes, you are too constipated. AVOID CONSTIPATION Drink plenty of liquids (1 1/2 to 2 quarts of water and other fluids a day unless fluid restricted for another medical condition). Liquids that contain caffeine (coffee a, tea, soft drinks) can be dehydrating and should be avoided until constipation is controlled. Consider minimizing milk, as dairy products may be constipating. Eat plenty of fiber (30g a day ideal, more if needed).  Fiber is the undigested part of plant food that passes into the colon, acting as "natures broom" to encourage bowel motility and movement.  Fiber can absorb and hold large amounts of water. This results in a larger, bulkier stool, which is soft and easier to pass.  Eating foods high in fiber - 12 servings - such as  Vegetables: Root (potatoes, carrots, turnips), Leafy green (lettuce, salad greens, celery, spinach), High residue (cabbage, broccoli, etc.) Fruit: Fresh, Dried (prunes, apricots, cherries), Stewed (applesauce)  Whole grain breads, pasta, whole wheat Bran cereals, muffins, etc. Consider adding supplemental bulking fiber which retains large volumes of water: Psyllium ground seeds (native plant from [place])--available as Metamucil, Konsyl, Effersyllium, Per Diem Fiber, or the less expensive generic forms.  Citrucel  (methylcellulose wood fiber) . FiberCon (Polycarbophil) Polyethylene Glycol - and "artificial" fiber commonly called Miralax or Glycolax.  It is helpful for people with gassy or bloated feelings with regular fiber Flax Seed - a less gassy natural fiber  Laxatives can be useful for a short period if constipation is severe Osmotics (Milk of Magnesia, Fleets Phospho-Soda, Magnesium Citrate)  Stimulants (Senokot,   Castor Oil,  Dulcolax, Ex-Lax)    Laxatives are not a good long-term solution as it can stress the bowels and cause too much mineral loss and dehydration.   Avoid taking laxatives for more than 7 days in a row.  AVOID DIARRHEA Switch to liquids and simpler foods for a few days to avoid stressing your intestines further. Avoid dairy products (especially milk & ice cream) for a short time.  The intestines often can lose the ability to digest lactose when stressed. Avoid foods that cause gassiness or bloating.  Typical foods include beans and other legumes, cabbage, broccoli, and dairy foods.  Every person has some sensitivity to other foods, so listen to your body and avoid those foods that trigger problems for you. Adding fiber (Citrucel, Metamucil, FiberCon, Flax seed, Miralax) gradually can help thicken stools by absorbing excess fluid and retrain the intestines to act more normally.  Slowly increase the dose over a few weeks.  Too much fiber too soon can backfire and cause cramping & bloating. Probiotics (such as active yogurt, Align, etc) may help repopulate the intestines and colon with normal bacteria and calm down a sensitive digestive tract.  Most studies show it to be of mild help, though, and such products can be costly. Medicines: Bismuth subsalicylate (ex. Kayopectate, Pepto Bismol) every 30 minutes for up to 6 doses can help control diarrhea.  Avoid if  pregnant. Loperamide (Immodium) can slow down diarrhea.  Start with two tablets (4mg  total) first and then try one tablet every 6 hours.  Avoid if you are having fevers or severe pain.  If you are not better or start feeling worse, stop all medicines  and call your doctor for advice Call your doctor if you are getting worse or not better.  Sometimes further testing (cultures, endoscopy, X-ray studies, bloodwork, etc) may be needed to help diagnose and treat the cause of the diarrhea. TREATMENT OF HEMORRHOID FLARE If these preventive measures fail, you must take action right away! Hemorrhoids are one condition that can be mild in the morning and become intolerable by nightfall. Most hemorrhoidal flares take several weeks to calm down.  These suggestions can help: Warm soaks.  This helps more than any topical medication.  Use up to 8 times a day.  Usually sitz baths or sitting in a warm bathtub helps.  Sitting on moist warm towels are helpful.  Switching to ice packs/cool compresses can be helpful Normalize your bowels.  Extremes of diarrhea or constipation will make hemorrhoids worse.  One soft bowel movement a day is the goal.  Fiber can help get your bowels regular Wet wipes instead of toilet paper Pain control with a NSAID such as ibuprofen (Advil) or naproxen (Aleve) or acetaminophen (Tylenol) around the clock.  Narcotics are constipating and should be minimized if possible Topical creams contain steroids (bydrocortisone) or local anesthetic (xylocaine) can help make pain and itching more tolerable.   EVALUATION If hemorrhoids are still causing problems, you could benefit by an evaluation by a surgeon.  The surgeon will obtain a history and examine you.  If hemorrhoids are diagnosed, some therapies can be offered in the office, usually with an anoscope into the less sensitive area of the rectum: -injection of hemorrhoids (sclerotherapy) can scar the blood vessels of the swollen/enlarged hemorrhoids to  help shrink them down to a more normal size -rubber banding of the enlarged hemorrhoids to help shrink them down to a more normal size -drainage of the blood clot causing a thrombosed hemorrhoid,  to relieve the severe pain   While 90% of the time such problems from hemorrhoids can be managed without preceding to surgery, sometimes the hemorrhoids require a operation to control the problem (uncontrolled bleeding, prolapse, pain, etc.).   This involves being placed under general anesthesia where the surgeon can confirm the diagnosis and remove, suture, or staple the hemorrhoid(s).  Your surgeon can help you treat the problem appropriately.    swollen glands  Managing Pain  Pain after surgery or related to activity is often due to strain/injury to muscle, tendon, nerves and/or incisions.  This pain is usually short-term and will improve in a few months.   Many people find it helpful to do the following things TOGETHER to help speed the process of healing and to get back to regular activity more quickly:  1. Avoid heavy physical activity a.  no lifting greater than 20 pounds b. Do not "push through" the pain.  Listen to your body and avoid positions and maneuvers than reproduce the pain c. Walking is okay as tolerated, but go slowly and stop when getting sore.  d. Remember: If it hurts to do it, then don't do it! 2. Take Anti-inflammatory medication  a. Take with food/snack around the clock for 1-2 weeks i. This helps the muscle and nerve tissues become less irritable and calm down faster b. Choose ONE of the following over-the-counter medications: i. Naproxen 220mg  tabs (ex. Aleve) 1-2 pills twice a day  ii. Ibuprofen 200mg  tabs (ex. Advil, Motrin) 3-4 pills with every meal and just before bedtime iii. Acetaminophen 500mg  tabs (Tylenol) 1-2 pills with every meal and just before bedtime 3. Use  a Heating pad or Ice/Cold Pack a. 4-6 times a day b. May use warm bath/hottub  or showers 4. Try  Gentle Massage and/or Stretching  a. at the area of pain many times a day b. stop if you feel pain - do not overdo it  Try these steps together to help you body heal faster and avoid making things get worse.  Doing just one of these things may not be enough.    If you are not getting better after two weeks or are noticing you are getting worse, contact our office for further advice; we may need to re-evaluate you & see what other things we can do to help.

## 2012-11-07 NOTE — Progress Notes (Signed)
Subjective:     Patient ID: Anna Mckinney, female   DOB: Jun 03, 1950, 62 y.o.   MRN: [id number]  HPI  Anna Mckinney  Jan 25, 1951 [id number]  Patient Care Team: Ronnald Nian, MD as PCP - General (Family Medicine) Joselyn Arrow, MD as Attending Physician (Family Medicine) Griffith Citron, MD as Consulting Physician (Gastroenterology)  Procedure (Date: 10/20/2012): [facility] hemorrhoidal ligation/pexy.  External hemorrhoidectomy x1  This patient returns for surgical evaluation.    She struggled with a lot of pressure.  She felt like some packing came out.  She struggles with rectal pain.  Is sitting on her left buttock.  Uncomfortable to sit straight down.  Printed take nonsteroidals given her prior history of Barrett's/ulcers.  Taking 500 mg 4 times a day Tylenol.  Did not tolerate oxycodone nor hydrocodone.  She gets severe nausea with that.  Tramadol helping a little bit.  Concern for urinary tract infection.  Called primary care physician went to ER and called [place].  I prescribed some Cipro.  That is better.  No more burning.  Moving bowels 3-4 loose a day.  Has not tried to slow this down.  She does feel bulging on the right side.  Concern about recurrent hemorrhoids.  No fevers or chills.  Had a sore throat.  Given Magic mouthwash.  Used that for a week.  Still has a chronic cough.  Feels like that is getting a little better.  No severe pain with swallowing.  Patient Active Problem List   Diagnosis Date Noted  . External hemorrhoids s/p ext hemorrhoidectomy 10/20/2012 11/07/2012  . Internal prolapsed hemorrhoids s/p THD hemorrhoidal ligation/pexy 10/20/2012 10/16/2012  . Dyslipidemia 09/26/2012  . GERD (gastroesophageal reflux disease) 07/24/2012  . Barrett's esophagus 07/24/2012  . Cough 12/22/2011  . Osteopenia 07/27/2010  . Pure hypercholesterolemia 07/27/2010    Past Medical History  Diagnosis Date  . Barrett's esophagus     last EGD 2011 in [facility]  . External hemorrhoids 2012   getting banding every other week with Dr. Kinnie Scales   . Osteopenia   . Impaired fasting glucose   . Increased serum lipids   . Hyperlipidemia   . Internal prolapsed hemorrhoids s/p THD hemorrhoidal ligation/pexy 10/16/2012    Past Surgical History  Procedure Laterality Date  . Cholecystectomy  2010  . Abdominal hysterectomy  1984    1 ovary remains; removed for "cancer cells"  . Transanal hemorrhoidal dearterialization  10/20/2012    hemorrhoidal ligation and pexy.  [name] Posterolateral external hemorrhoidactomy  10/20/2012    left    History   Social History  . Marital Status: Married    Spouse Name: N/A    Number of Children: 2  . Years of Education: N/A   Occupational History  . 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 Smoking status: Former Smoker -- 0.30 packs/day for 20 years    Types: Cigarettes    Quit date: 01/24/2009  . Smokeless tobacco: Never Used  . Alcohol Use: No  . Drug Use: No  . Sexual Activity: Not on file   Other Topics Concern  . Not on file   Social History Narrative  . No narrative on file    Family History  Problem Relation Age of Onset  . Cancer Mother 74    breast cancer  . COPD Father   . Heart disease Father     CHF  . Atrial fibrillation Father   . Angelman syndrome  Daughter   . Seizures Daughter   . Cancer Paternal Grandmother     stomach  . Thyroid disease Sister     Current Outpatient Prescriptions  Medication Sig Dispense Refill  . Alum & Mag Hydroxide-Simeth (MAGIC MOUTHWASH) SOLN Take 15 mL by mouth 4 (four) times daily.  400 mL  1  . Ascorbic Acid (VITAMIN C) 100 MG tablet Take 100 mg by mouth daily.        . Calcium Carbonate-Vitamin D (CALCIUM + D PO) Take by mouth 2 (two) times daily.        . cholecalciferol (VITAMIN D) 1000 UNITS tablet Take 2,000 Units by mouth daily.       [name] dexlansoprazole (DEXILANT) 60 MG capsule Take 1 capsule (60 mg total) by mouth daily.  30 capsule  11  . hydrocortisone  (ANUSOL-HC) 2.5 % rectal cream Place rectally 2 (two) times daily.  60 g  1  . oxyCODONE (OXY IR/ROXICODONE) 5 MG immediate release tablet Take 1-2 tablets (5-10 mg total) by mouth every 4 (four) hours as needed for pain.  40 tablet  0  . Probiotic Product (SOLUBLE FIBER/PROBIOTICS PO) Take 1 tablet by mouth daily.       [name] Specialty Vitamins Products (MAGNESIUM, AMINO ACID CHELATE,) 133 MG tablet Take 1 tablet by mouth 2 (two) times daily.      [name] sulfamethoxazole-trimethoprim (BACTRIM DS) 800-160 MG per tablet Take 1 tablet by mouth 2 (two) times daily.  20 tablet  0  . traMADol (ULTRAM) 50 MG tablet 1-2 every 6 hours as needed for  pain  50 tablet  0  . zolpidem (AMBIEN CR) 12.5 MG CR tablet Take 1 tablet (12.5 mg total) by mouth at bedtime as needed for sleep.  30 tablet  2   No current facility-administered medications for this visit.     Allergies  Allergen Reactions  . Codeine   . Vicodin [Hydrocodone-Acetaminophen] Itching and Nausea And Vomiting    BP 128/82  Pulse 94  Temp(Src) 97.3 F (36.3 C)  Resp 18  Ht 5' 4.5" (1.638 m)  Wt 146 lb 12.8 oz (66.588 kg)  BMI 24.82 kg/m2  Mm Digital Screening  10/12/2012   *RADIOLOGY REPORT*  Clinical Data: Screening.  DIGITAL SCREENING BILATERAL MAMMOGRAM WITH CAD DIGITAL BREAST TOMOSYNTHESIS  Digital breast tomosynthesis images are acquired in two projections.  These images are reviewed in combination with the digital mammogram, confirming the findings below.  Comparison:  Previous exam(s).  FINDINGS:  ACR Breast Density Category c:  The breast tissue is heterogeneously dense, which may obscure small masses.  There are no findings suspicious for malignancy.  Images were processed with CAD.  IMPRESSION: No mammographic evidence of malignancy.  A result letter of this screening mammogram will be mailed directly to the patient.  RECOMMENDATION: Screening mammogram in one year. (Code:SM-B-01Y)  BI-RADS CATEGORY 1:  Negative.   Original Report  Authenticated By: Anna Mckinney, M.D.    Review of Systems  Constitutional: Negative for fever, chills and diaphoresis.  HENT: Positive for sore throat. Negative for ear pain, rhinorrhea and trouble swallowing.   Eyes: Negative for photophobia and visual disturbance.  Respiratory: Negative for cough and choking.   Cardiovascular: Negative for chest pain and palpitations.  Gastrointestinal: Positive for anal bleeding and rectal pain. Negative for nausea, vomiting, abdominal pain, diarrhea and constipation.  Genitourinary: Negative for dysuria, urgency, frequency, hematuria, decreased urine volume, vaginal bleeding, vaginal discharge, difficulty urinating, vaginal pain and pelvic pain.  Musculoskeletal:  Negative for myalgias and gait problem.  Skin: Negative for color change, pallor and rash.  Neurological: Negative for dizziness, speech difficulty, weakness and numbness.  Hematological: Negative for adenopathy.  Psychiatric/Behavioral: Negative for confusion and agitation. The patient is not nervous/anxious.        Objective:   Physical Exam  Constitutional: She is oriented to person, place, and time. She appears well-developed and well-nourished. No distress.  HENT:  Head: Normocephalic.  Mouth/Throat: Uvula is midline, oropharynx is clear and moist and mucous membranes are normal. No oropharyngeal exudate, posterior oropharyngeal edema, posterior oropharyngeal erythema or tonsillar abscesses.  No thrush or Candida.  Eyes: Conjunctivae and EOM are normal. Pupils are equal, round, and reactive to light. No scleral icterus.  Neck: Normal range of motion. No tracheal deviation present.  Cardiovascular: Normal rate and intact distal pulses.   Pulmonary/Chest: Effort normal. No respiratory distress. She exhibits no tenderness.  Abdominal: Soft. She exhibits no distension. There is no tenderness. Hernia confirmed negative in the right inguinal area and confirmed negative in the left  inguinal area.  Incisions clean with normal healing ridges.  No hernias  Genitourinary: No vaginal discharge found.  Exam done with assistance of female Medical Assistant in the room.  Perianal skin clean with good hygiene.  No pruritis.  No pilonidal disease.  No fissure.  No abscess/fistula. Normal sphincter tone.   Right anterior and posterior external hemorrhoid swelling.  No bleeding.  Musculoskeletal: Normal range of motion. She exhibits no tenderness.  Lymphadenopathy:       Right: No inguinal adenopathy present.       Left: No inguinal adenopathy present.  Neurological: She is alert and oriented to person, place, and time. No cranial nerve deficit. She exhibits normal muscle tone. Coordination normal.  Skin: Skin is warm and dry. No rash noted. She is not diaphoretic.  Psychiatric: She has a normal mood and affect. Her behavior is normal.       Assessment:     Struggling but gradually healing status post THD hemorrhoidal ligation and pexy in a patient with limited pain medication options     Plan:     I am sorry that she has been struggling.  I noted we never put packing in the rectum side, with that was about.  Perhaps one of the stitches pulled.  I am disappointed she has some swelling on the right side, but I am hopeful that with time the hemorrhoids will shrink down after the stitches with THD.  She may still have some tags but I would give it time to calm down first.  I recommended she try improve pain control.  I offered to prescribe some oral Dilaudid, but she did not want to do that.  She wished to stay on tramadol.  She feels like she has enough pills for now.  Asked her to call me if she needed a refill.  Double the Tylenol to 1g 4 times a day for the next couple weeks.  Do more warm soaks/ice packs.  Increase activity as tolerated to regular activity.  Low impact exercise such as walking an hour a day at least ideal.  Do not push through pain.  Diet as tolerated.  Low  fat high fiber diet ideal.  Bowel regimen with 30 g fiber a day and fiber supplement as needed to avoid problems.  The anatomy & physiology of the anorectal region was discussed.  The pathophysiology of hemorrhoids and differential diagnosis was discussed.  Natural history  progression  was discussed.   I stressed the importance of a bowel regimen to have daily soft bowel movements to minimize progression of disease.   Goal of one BM / day ideal.  Use of wet wipes, warm baths, avoiding straining, etc were emphasized.  Educational handouts further explaining the pathology, treatment options, and bowel regimen were given as well.   The patient expressed understanding.  Return to clinic 3 weeks.   Instructions discussed.  Followup with primary care physician for other health issues as would normally be done.  Questions answered.  The patient expressed understanding and appreciation

## 2012-11-10 ENCOUNTER — Ambulatory Visit (INDEPENDENT_AMBULATORY_CARE_PROVIDER_SITE_OTHER): Payer: BC Managed Care – PPO | Admitting: Family Medicine

## 2012-11-10 ENCOUNTER — Encounter: Payer: Self-pay | Admitting: Family Medicine

## 2012-11-10 VITALS — BP 120/70 | HR 80 | Temp 98.1°F | Wt 147.0 lb

## 2012-11-10 DIAGNOSIS — R05 Cough: Secondary | ICD-10-CM

## 2012-11-10 MED ORDER — ALBUTEROL SULFATE HFA 108 (90 BASE) MCG/ACT IN AERS: 2.0000 | INHALATION_SPRAY | Freq: Four times a day (QID) | RESPIRATORY_TRACT | Status: DC | PRN

## 2012-11-10 MED ORDER — HYDROCOD POLST-CHLORPHEN POLST 10-8 MG/5ML PO LQCR: 5.0000 mL | Freq: Two times a day (BID) | ORAL | Status: DC | PRN

## 2012-11-10 NOTE — Patient Instructions (Signed)
Definitely stop the Magic mouthwash

## 2012-11-10 NOTE — Progress Notes (Signed)
  Subjective:    Patient ID: Anna Mckinney, female    DOB: May 19, 1950, 62 y.o.   MRN: [id number]  HPI 2 weeks ago after having surgery she had difficulty with sore throat. She was given Magic mouthwash to help with presumed thrush. She has been using the medication fairly consistently for the last 2 weeks . And the last 4 days she has had more difficulty with coughing but no fever, chills, sore throat or earache. She does not smoke. She has tried Robitussin-DM with little success.  Review of Systems     Objective:   Physical Exam alert and in no distress. Tympanic membranes and canals are normal. Throat is clear. Tonsils are normal. Neck is supple without adenopathy or thyromegaly. Cardiac exam shows a regular sinus rhythm without murmurs or gallops. Lungs are clear to auscultation.        Assessment & Plan:  Cough - Plan: albuterol (PROVENTIL HFA;VENTOLIN HFA) 108 (90 BASE) MCG/ACT inhaler, chlorpheniramine-HYDROcodone (TUSSIONEX PENNKINETIC ER) 10-8 MG/5ML LQCR  I recommend she try the Proventil first and did use Tussionex as needed. Explained that I did not think that she had evidence of infection and therefore did not prescribe an antibiotic. She was comfortable with this.

## 2012-11-21 ENCOUNTER — Encounter (INDEPENDENT_AMBULATORY_CARE_PROVIDER_SITE_OTHER): Payer: Self-pay | Admitting: Surgery

## 2012-11-21 ENCOUNTER — Ambulatory Visit (INDEPENDENT_AMBULATORY_CARE_PROVIDER_SITE_OTHER): Payer: BC Managed Care – PPO | Admitting: Surgery

## 2012-11-21 VITALS — BP 118/70 | HR 76 | Resp 16 | Ht 64.0 in | Wt 146.4 lb

## 2012-11-21 DIAGNOSIS — K648 Other hemorrhoids: Secondary | ICD-10-CM

## 2012-11-21 DIAGNOSIS — K644 Residual hemorrhoidal skin tags: Secondary | ICD-10-CM

## 2012-11-21 NOTE — Patient Instructions (Addendum)
HEMORRHOIDS  The rectum is the last foot of your colon, and it naturally stretches to hold stool.  Hemorrhoidal piles are natural clusters of blood vessels that help the rectum and anal canal stretch to hold stool and allow bowel movements to eliminate feces.   Hemorrhoids are abnormally swollen blood vessels in the rectum.  Too much pressure in the rectum causes hemorrhoids by forcing blood to stretch and bulge the walls of the veins, sometimes even rupturing them.  Hemorrhoids can become like varicose veins you might see on a person's legs.  Most people will develop a flare of hemorrhoids in their lifetime.  When bulging hemorrhoidal veins are irritated, they can swell, burn, itch, cause pain, and bleed.  Most flares will calm down gradually own within a few weeks.  However, once hemorrhoids are created, they are difficult to get rid of completely and tend to flare more easily than the first flare.   Fortunately, good habits and simple medical treatment usually control hemorrhoids well, and surgery is needed only in severe cases. Types of Hemorrhoids:  Internal hemorrhoids usually don't initially hurt or itch; they are deep inside the rectum and usually have no sensation. If they begin to push out (prolapse), pain and burning can occur.  However, internal hemorrhoids can bleed.  Anal bleeding should not be ignored since bleeding could come from a dangerous source like colorectal cancer, so persistent rectal bleeding should be investigated by a doctor, sometimes with a colonoscopy.  External hemorrhoids cause most of the symptoms - pain, burning, and itching. Nonirritated hemorrhoids can look like small skin tags coming out of the anus.   Thrombosed hemorrhoids can form when a hemorrhoid blood vessel bursts and causes the hemorrhoid to suddenly swell.  A purple blood clot can form in it and become an excruciatingly painful lump at the anus. Because of these unpleasant symptoms, immediate incision and  drainage by a surgeon at an office visit can provide much relief of the pain.    PREVENTION Avoiding the most frequent causes listed below will prevent most cases of hemorrhoids: Constipation Hard stools Diarrhea  Constant sitting  Straining with bowel movements Sitting on the toilet for a long time  Severe coughing  episodes Pregnancy / Childbirth  Heavy Lifting  Sometimes avoiding the above triggers is difficult:  How can you avoid sitting all day if you have a seated job? Also, we try to avoid coughing and diarrhea, but sometimes it's beyond your control.  Still, there are some practical hints to help: Keep the anal and genital area clean.  Moistened tissues such as flushable wet wipes are less irritating than toilet paper.  Using irrigating showers or bottle irrigation washing gently cleans this sensitive area.   Avoid dry toilet paper when cleaning after bowel movements.  [name] Keep the anal and genital area dry.  Lightly pat the rectal area dry.  Avoid rubbing.  Talcum or baby powders can help GET YOUR STOOLS SOFT.   This is the most important way to prevent irritated hemorrhoids.  Hard stools are like sandpaper to the anorectal canal and will cause more problems.  The goal: ONE SOFT BOWEL MOVEMENT A DAY!  BMs from every other day to 3 times a day is a tolerable range Treat coughing, diarrhea and constipation early since irritated hemorrhoids may soon follow.  If your main job activity is seated, always stand or walk during your breaks. Make it a point to stand and walk at least 5 minutes every hour  and try to shift frequently in your chair to avoid direct rectal pressure.  Always exhale as you strain or lift. Don't hold your breath.  Do not delay or try to prevent a bowel movement when the urge is present. Exercise regularly (walking or jogging 60 minutes a day) to stimulate the bowels to move. No reading or other activity while on the toilet. If bowel movements take longer than 5 minutes,  you are too constipated. AVOID CONSTIPATION Drink plenty of liquids (1 1/2 to 2 quarts of water and other fluids a day unless fluid restricted for another medical condition). Liquids that contain caffeine (coffee a, tea, soft drinks) can be dehydrating and should be avoided until constipation is controlled. Consider minimizing milk, as dairy products may be constipating. Eat plenty of fiber (30g a day ideal, more if needed).  Fiber is the undigested part of plant food that passes into the colon, acting as "natures broom" to encourage bowel motility and movement.  Fiber can absorb and hold large amounts of water. This results in a larger, bulkier stool, which is soft and easier to pass.  Eating foods high in fiber - 12 servings - such as  Vegetables: Root (potatoes, carrots, turnips), Leafy green (lettuce, salad greens, celery, spinach), High residue (cabbage, broccoli, etc.) Fruit: Fresh, Dried (prunes, apricots, cherries), Stewed (applesauce)  Whole grain breads, pasta, whole wheat Bran cereals, muffins, etc. Consider adding supplemental bulking fiber which retains large volumes of water: Psyllium ground seeds (native plant from [place])--available as Metamucil, Konsyl, Effersyllium, Per Diem Fiber, or the less expensive generic forms.  Citrucel  (methylcellulose wood fiber) . FiberCon (Polycarbophil) Polyethylene Glycol - and "artificial" fiber commonly called Miralax or Glycolax.  It is helpful for people with gassy or bloated feelings with regular fiber Flax Seed - a less gassy natural fiber  Laxatives can be useful for a short period if constipation is severe Osmotics (Milk of Magnesia, Fleets Phospho-Soda, Magnesium Citrate)  Stimulants (Senokot,   Castor Oil,  Dulcolax, Ex-Lax)    Laxatives are not a good long-term solution as it can stress the bowels and cause too much mineral loss and dehydration.   Avoid taking laxatives for more than 7 days in a row.  AVOID DIARRHEA Switch to  liquids and simpler foods for a few days to avoid stressing your intestines further. Avoid dairy products (especially milk & ice cream) for a short time.  The intestines often can lose the ability to digest lactose when stressed. Avoid foods that cause gassiness or bloating.  Typical foods include beans and other legumes, cabbage, broccoli, and dairy foods.  Every person has some sensitivity to other foods, so listen to your body and avoid those foods that trigger problems for you. Adding fiber (Citrucel, Metamucil, FiberCon, Flax seed, Miralax) gradually can help thicken stools by absorbing excess fluid and retrain the intestines to act more normally.  Slowly increase the dose over a few weeks.  Too much fiber too soon can backfire and cause cramping & bloating. Probiotics (such as active yogurt, Align, etc) may help repopulate the intestines and colon with normal bacteria and calm down a sensitive digestive tract.  Most studies show it to be of mild help, though, and such products can be costly. Medicines: Bismuth subsalicylate (ex. Kayopectate, Pepto Bismol) every 30 minutes for up to 6 doses can help control diarrhea.  Avoid if pregnant. Loperamide (Immodium) can slow down diarrhea.  Start with two tablets (57m total) first and then try one tablet  every 6 hours.  Avoid if you are having fevers or severe pain.  If you are not better or start feeling worse, stop all medicines and call your doctor for advice Call your doctor if you are getting worse or not better.  Sometimes further testing (cultures, endoscopy, X-ray studies, bloodwork, etc) may be needed to help diagnose and treat the cause of the diarrhea. TREATMENT OF HEMORRHOID FLARE If these preventive measures fail, you must take action right away! Hemorrhoids are one condition that can be mild in the morning and become intolerable by nightfall. Most hemorrhoidal flares take several weeks to calm down.  These suggestions can help: Warm soaks.   This helps more than any topical medication.  Use up to 8 times a day.  Usually sitz baths or sitting in a warm bathtub helps.  Sitting on moist warm towels are helpful.  Switching to ice packs/cool compresses can be helpful Normalize your bowels.  Extremes of diarrhea or constipation will make hemorrhoids worse.  One soft bowel movement a day is the goal.  Fiber can help get your bowels regular Wet wipes instead of toilet paper Pain control with a NSAID such as ibuprofen (Advil) or naproxen (Aleve) or acetaminophen (Tylenol) around the clock.  Narcotics are constipating and should be minimized if possible Topical creams contain steroids (bydrocortisone) or local anesthetic (xylocaine) can help make pain and itching more tolerable.   EVALUATION If hemorrhoids are still causing problems, you could benefit by an evaluation by a surgeon.  The surgeon will obtain a history and examine you.  If hemorrhoids are diagnosed, some therapies can be offered in the office, usually with an anoscope into the less sensitive area of the rectum: -injection of hemorrhoids (sclerotherapy) can scar the blood vessels of the swollen/enlarged hemorrhoids to help shrink them down to a more normal size -rubber banding of the enlarged hemorrhoids to help shrink them down to a more normal size -drainage of the blood clot causing a thrombosed hemorrhoid,  to relieve the severe pain   While 90% of the time such problems from hemorrhoids can be managed without preceding to surgery, sometimes the hemorrhoids require a operation to control the problem (uncontrolled bleeding, prolapse, pain, etc.).   This involves being placed under general anesthesia where the surgeon can confirm the diagnosis and remove, suture, or staple the hemorrhoid(s).  Your surgeon can help you treat the problem appropriately.

## 2012-11-21 NOTE — Progress Notes (Signed)
Subjective:     Patient ID: Anna Mckinney, female   DOB: 1951-02-01, 62 y.o.   MRN: [id number]  HPI   Anna Mckinney  [birth year] [id number]  Patient Care Team: Ronnald Nian, MD as PCP - General (Family Medicine) Joselyn Arrow, MD as Attending Physician (Family Medicine) Griffith Citron, MD as Consulting Physician (Gastroenterology)  Procedure (Date: 10/20/2012): [facility] hemorrhoidal ligation/pexy.  External hemorrhoidectomy x1  This patient returns for surgical evaluation.    She is doing better.  Soreness is less.  Consent on the more.  Still having small rabbit pellet-like bowel movements with fiber regimen once a day.  No fevers or chills.  Had a sore throat - better now.  He has been wanting to resume vaginal sex.  She is weary of doing that.  Patient Active Problem List   Diagnosis Date Noted  . External hemorrhoids s/p ext hemorrhoidectomy 10/20/2012 11/07/2012  . Internal prolapsed hemorrhoids s/p THD hemorrhoidal ligation/pexy 10/20/2012 10/16/2012  . Dyslipidemia 09/26/2012  . GERD (gastroesophageal reflux disease) 07/24/2012  . Barrett's esophagus 07/24/2012  . Cough 12/22/2011  . Osteopenia 07/27/2010  . Pure hypercholesterolemia 07/27/2010    Past Medical History  Diagnosis Date  . Barrett's esophagus     last EGD 2011 in [facility]  . External hemorrhoids 2012    getting banding every other week with Dr. Kinnie Scales   . Osteopenia   . Impaired fasting glucose   . Increased serum lipids   . Hyperlipidemia   . Internal prolapsed hemorrhoids s/p THD hemorrhoidal ligation/pexy 10/16/2012    Past Surgical History  Procedure Laterality Date  . Cholecystectomy  2010  . Abdominal hysterectomy  1984    1 ovary remains; removed for "cancer cells"  . Transanal hemorrhoidal dearterialization  10/20/2012    hemorrhoidal ligation and pexy.  [name] Posterolateral external hemorrhoidactomy  10/20/2012    left    History   Social History  . Marital Status: Married    Spouse Name:  N/A    Number of Children: 2  . Years of Education: N/A   Occupational History  . 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 Smoking status: Former Smoker -- 0.30 packs/day for 20 years    Types: Cigarettes    Quit date: 01/24/2009  . Smokeless tobacco: Never Used  . Alcohol Use: No  . Drug Use: No  . Sexual Activity: Not on file   Other Topics Concern  . Not on file   Social History Narrative  . No narrative on file    Family History  Problem Relation Age of Onset  . Cancer Mother 48    breast cancer  . COPD Father   . Heart disease Father     CHF  . Atrial fibrillation Father   . Angelman syndrome Daughter   . Seizures Daughter   . Cancer Paternal Grandmother     stomach  . Thyroid disease Sister     Current Outpatient Prescriptions  Medication Sig Dispense Refill  . albuterol (PROVENTIL HFA;VENTOLIN HFA) 108 (90 BASE) MCG/ACT inhaler Inhale 2 puffs into the lungs every 6 (six) hours as needed for wheezing.  1 Inhaler  0  . Alum & Mag Hydroxide-Simeth (MAGIC MOUTHWASH) SOLN Take 15 mL by mouth 4 (four) times daily.  400 mL  1  . Ascorbic Acid (VITAMIN C) 100 MG tablet Take 100 mg by mouth daily.        . Calcium Carbonate-Vitamin D (CALCIUM +  D PO) Take by mouth 2 (two) times daily.        . chlorpheniramine-HYDROcodone (TUSSIONEX PENNKINETIC ER) 10-8 MG/5ML LQCR Take 5 mLs by mouth every 12 (twelve) hours as needed.  140 mL  0  . cholecalciferol (VITAMIN D) 1000 UNITS tablet Take 2,000 Units by mouth daily.       [name] dexlansoprazole (DEXILANT) 60 MG capsule Take 1 capsule (60 mg total) by mouth daily.  30 capsule  11  . hydrocortisone (ANUSOL-HC) 2.5 % rectal cream Place rectally 2 (two) times daily.  60 g  1  . Probiotic Product (SOLUBLE FIBER/PROBIOTICS PO) Take 1 tablet by mouth daily.       [name] Specialty Vitamins Products (MAGNESIUM, AMINO ACID CHELATE,) 133 MG tablet Take 1 tablet by mouth 2 (two) times daily.      . traMADol (ULTRAM) 50 MG  tablet 1-2 every 6 hours as needed for  pain  50 tablet  0  . zolpidem (AMBIEN CR) 12.5 MG CR tablet Take 1 tablet (12.5 mg total) by mouth at bedtime as needed for sleep.  30 tablet  2   No current facility-administered medications for this visit.     Allergies  Allergen Reactions  . Codeine   . Vicodin [Hydrocodone-Acetaminophen] Itching and Nausea And Vomiting    BP 118/70  Pulse 76  Resp 16  Ht 5\' 4"  (1.626 m)  Wt 146 lb 6.4 oz (66.407 kg)  BMI 25.12 kg/m2  Mm Digital Screening  10/12/2012   *RADIOLOGY REPORT*  Clinical Data: Screening.  DIGITAL SCREENING BILATERAL MAMMOGRAM WITH CAD DIGITAL BREAST TOMOSYNTHESIS  Digital breast tomosynthesis images are acquired in two projections.  These images are reviewed in combination with the digital mammogram, confirming the findings below.  Comparison:  Previous exam(s).  FINDINGS:  ACR Breast Density Category c:  The breast tissue is heterogeneously dense, which may obscure small masses.  There are no findings suspicious for malignancy.  Images were processed with CAD.  IMPRESSION: No mammographic evidence of malignancy.  A result letter of this screening mammogram will be mailed directly to the patient.  RECOMMENDATION: Screening mammogram in one year. (Code:SM-B-01Y)  BI-RADS CATEGORY 1:  Negative.   Original Report Authenticated By: Edwin Cap, M.D.    Review of Systems  Constitutional: Negative for fever, chills and diaphoresis.  HENT: Negative for ear pain, rhinorrhea and trouble swallowing.   Eyes: Negative for photophobia and visual disturbance.  Respiratory: Negative for cough and choking.   Cardiovascular: Negative for chest pain and palpitations.  Gastrointestinal: Positive for rectal pain. Negative for nausea, vomiting, abdominal pain, diarrhea and constipation.  Genitourinary: Negative for dysuria, urgency, frequency, hematuria, decreased urine volume, vaginal bleeding, vaginal discharge, difficulty urinating, vaginal pain  and pelvic pain.  Musculoskeletal: Negative for myalgias and gait problem.  Skin: Negative for color change, pallor and rash.  Neurological: Negative for dizziness, speech difficulty, weakness and numbness.  Hematological: Negative for adenopathy.  Psychiatric/Behavioral: Negative for confusion and agitation. The patient is not nervous/anxious.        Objective:   Physical Exam  Constitutional: She is oriented to person, place, and time. She appears well-developed and well-nourished. No distress.  HENT:  Head: Normocephalic.  Mouth/Throat: Uvula is midline, oropharynx is clear and moist and mucous membranes are normal. No oropharyngeal exudate, posterior oropharyngeal edema, posterior oropharyngeal erythema or tonsillar abscesses.  No thrush or Candida.  Eyes: Conjunctivae and EOM are normal. Pupils are equal, round, and reactive to light. No scleral icterus.  Neck: Normal range of motion. No tracheal deviation present.  Cardiovascular: Normal rate and intact distal pulses.   Pulmonary/Chest: Effort normal. No respiratory distress. She exhibits no tenderness.  Abdominal: Soft. She exhibits no distension. There is no tenderness. Hernia confirmed negative in the right inguinal area and confirmed negative in the left inguinal area.  Genitourinary: No vaginal discharge found.  Exam done with assistance of female Medical Assistant in the room.  Perianal skin clean with good hygiene.  No pruritis.  No pilonidal disease.  No fissure.  No abscess/fistula. Normal sphincter tone.   Right posterior int partially prolapsed hemorrhoid swelling.  Milder right anterior external hemorrhoid.  No bleeding.  Musculoskeletal: Normal range of motion. She exhibits no tenderness.  Lymphadenopathy:       Right: No inguinal adenopathy present.       Left: No inguinal adenopathy present.  Neurological: She is alert and oriented to person, place, and time. No cranial nerve deficit. She exhibits normal muscle tone.  Coordination normal.  Skin: Skin is warm and dry. No rash noted. She is not diaphoretic.  Psychiatric: She has a normal mood and affect. Her behavior is normal.       Assessment:     Struggling but gradually healing status post THD hemorrhoidal ligation and pexy    Plan:     Perhaps one of the stitches pulled.  I am disappointed she has some swelling on the right side, but I am hopeful that with time the right anterior hemorrhoid will shrink down after the stitches with THD.  I suspect a right posterior partially prolapsed hemorrhoid will not fully resolve.  She may need another operation to remove the remaining external hemorrhoids, but I would wait at least three months from the last surgery before considering that.  I would like to see if the hemorrhoids will shrink more and have her prove to me that her bowels are under better control.  Increase activity as tolerated to regular activity.  Low impact exercise such as walking an hour a day at least ideal.  Do not push through pain.  She can try vaginal sex, but I suspect it will be too uncomfortable.  Would wait at least two if not for more weeks.  Diet as tolerated.  Low fat high fiber diet ideal.  Increase her fiber supplementation with a bowel regimen with 30 g fiber a day and fiber supplement as needed to avoid problems.  The anatomy & physiology of the anorectal region was discussed.  The pathophysiology of hemorrhoids and differential diagnosis was discussed.  Natural history progression  was discussed.   I stressed the importance of a bowel regimen to have daily soft bowel movements to minimize progression of disease.   Goal of one BM / day ideal.  Use of wet wipes, warm baths, avoiding straining, etc were emphasized.  Educational handouts further explaining the pathology, treatment options, and bowel regimen were given as well.   The patient expressed understanding.  Return to clinic 1 month.   Instructions discussed.  Followup with  primary care physician for other health issues as would normally be done.  Questions answered.  The patient expressed understanding and appreciation

## 2012-11-27 ENCOUNTER — Encounter: Payer: Self-pay | Admitting: Family Medicine

## 2012-11-27 ENCOUNTER — Ambulatory Visit (INDEPENDENT_AMBULATORY_CARE_PROVIDER_SITE_OTHER): Payer: BC Managed Care – PPO | Admitting: Family Medicine

## 2012-11-27 VITALS — BP 120/70 | HR 68 | Temp 98.2°F | Ht 63.75 in | Wt 147.0 lb

## 2012-11-27 DIAGNOSIS — J209 Acute bronchitis, unspecified: Secondary | ICD-10-CM

## 2012-11-27 DIAGNOSIS — R05 Cough: Secondary | ICD-10-CM

## 2012-11-27 MED ORDER — AZITHROMYCIN 250 MG PO TABS: ORAL_TABLET | ORAL | Status: DC

## 2012-11-27 MED ORDER — BENZONATATE 200 MG PO CAPS: 200.0000 mg | ORAL_CAPSULE | Freq: Three times a day (TID) | ORAL | Status: DC | PRN

## 2012-11-27 MED ORDER — PREDNISONE 20 MG PO TABS: 20.0000 mg | ORAL_TABLET | Freq: Two times a day (BID) | ORAL | Status: DC

## 2012-11-27 NOTE — Progress Notes (Signed)
Chief Complaint  Patient presents with  . Cough    still coughing, coughs so much she vomits. saw Dr.Lalonde 11/10/12 and is not any better. She is going to come in next month for flu vaccine wants to know if she needs another pneumo shot?   She was seen on 9/5 by Dr. Susann Givens with complaint of cough. She has been coughing since she had hemorrhoid surgery. She reports having thrush after her surgery, and has been coughing every since.  When she saw Dr. Susann Givens, she was prescribed albuterol and tussionex.  The Tussionex makes her nauseated, and only soothes the cough for about 30 minutes. Albuterol didn't help.  Still having spells of coughing.  Sometimes she gets up thick clear mucus, other times the phlegm is very thick and yellow.  She denies any fevers.  Having post-tussive emesis (gagged by thick phlegm in her throat).  Denies any nasal congestion, sinus pressure or postnasal drainage.  Denies sore throat.  She tried Robitussin prior to her visit with Dr Susann Givens but it wasn't helpful.  Cough is making her hemorrhoids more sore (she might need additional therapy).  She has had issues with chronic cough in the past, has seen pulmonary. It was felt to be related to GERD, which is better since taking Dexilant. This is not the same cough.  Past Medical History  Diagnosis Date  . Barrett's esophagus     last EGD 2011 in [facility]  . External hemorrhoids 2012    getting banding every other week with Dr. Kinnie Scales   . Osteopenia   . Impaired fasting glucose   . Increased serum lipids   . Hyperlipidemia   . Internal prolapsed hemorrhoids s/p THD hemorrhoidal ligation/pexy 10/16/2012   Past Surgical History  Procedure Laterality Date  . Cholecystectomy  2010  . Abdominal hysterectomy  1984    1 ovary remains; removed for "cancer cells"  . Transanal hemorrhoidal dearterialization  10/20/2012    hemorrhoidal ligation and pexy.  [name] Posterolateral external hemorrhoidactomy  10/20/2012    left   History    Social History  . Marital Status: Married    Spouse Name: N/A    Number of Children: 2  . Years of Education: N/A   Occupational History  . 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 Smoking status: Former Smoker -- 0.30 packs/day for 20 years    Types: Cigarettes    Quit date: 01/24/2009  . Smokeless tobacco: Never Used  . Alcohol Use: No  . Drug Use: No  . Sexual Activity: Not on file   Other Topics Concern  . Not on file   Social History Narrative  . No narrative on file     Current outpatient prescriptions:hydrocortisone (ANUSOL-HC) 2.5 % rectal cream, Place rectally 2 (two) times daily., Disp: 60 g, Rfl: 1;  Probiotic Product (SOLUBLE FIBER/PROBIOTICS PO), Take 1 tablet by mouth daily. , Disp: , Rfl: ;  psyllium (METAMUCIL) 58.6 % powder, Take 1 packet by mouth daily., Disp: , Rfl: ;  zolpidem (AMBIEN CR) 12.5 MG CR tablet, Take 1 tablet (12.5 mg total) by mouth at bedtime as needed for sleep., Disp: 30 tablet, Rfl: 2 albuterol (PROVENTIL HFA;VENTOLIN HFA) 108 (90 BASE) MCG/ACT inhaler, Inhale 2 puffs into the lungs every 6 (six) hours as needed for wheezing., Disp: 1 Inhaler, Rfl: 0;  Ascorbic Acid (VITAMIN C) 100 MG tablet, Take 100 mg by mouth daily.  , Disp: , Rfl: ;  Calcium  Carbonate-Vitamin D (CALCIUM + D PO), Take by mouth 2 (two) times daily.  , Disp: , Rfl:  chlorpheniramine-HYDROcodone (TUSSIONEX PENNKINETIC ER) 10-8 MG/5ML LQCR, Take 5 mLs by mouth every 12 (twelve) hours as needed., Disp: 140 mL, Rfl: 0;  cholecalciferol (VITAMIN D) 1000 UNITS tablet, Take 2,000 Units by mouth daily. , Disp: , Rfl: ;  dexlansoprazole (DEXILANT) 60 MG capsule, Take 1 capsule (60 mg total) by mouth daily., Disp: 30 capsule, Rfl: 11 Specialty Vitamins Products (MAGNESIUM, AMINO ACID CHELATE,) 133 MG tablet, Take 1 tablet by mouth 2 (two) times daily., Disp: , Rfl:   Allergies  Allergen Reactions  . Codeine   . Vicodin [Hydrocodone-Acetaminophen] Itching and Nausea  And Vomiting   ROS:  Denies fevers, chills, chest pain, headaches, dizziness, rashes, bleeding/bruising, GI complaints or other problems.  See HPI  PHYSICAL EXAM: BP 120/70  Pulse 68  Temp(Src) 98.2 F (36.8 C)  Ht 5' 3.75" (1.619 m)  Wt 147 lb (66.679 kg)  BMI 25.44 kg/m2 Well developed, pleasant female in no distress.  Frequent cough, sounds dry and somewhat barky HEENT:  PERRL, EOMI, conjunctiva clear.  TM and EAC normal on right.  L TM has mild erythema, but no effusion.  EAC normal. Nasal mucosa mildly edematous L>R, clear mucus, no erythema.  Sinuses nontender.  OP clear Neck: no lymphadenopathy, thyromegaly or mass Heart: regular rate and rhythm without murmur Lungs: good air flow, slightly coarse breath sounds.  No rales or ronchi, no wheezes. Skin: no rash Psych: normal mood, affect Neuro: alert and oriented.  Cranial nerves intact. Normal strength, gait  ASSESSMENT/PLAN:  Acute bronchitis - Plan: azithromycin (ZITHROMAX) 250 MG tablet  Cough - Plan: predniSONE (DELTASONE) 20 MG tablet, benzonatate (TESSALON) 200 MG capsule  Cough--likely related to bronchitis.  Intolerant of narcotics Will treat with z-pak, as well as a short course of steroids, and tessalon prn.  Risks and side effects of meds were reviewed in detail.  She had questions re: vaccines.  She won't need another pneumonia vaccine until age 58.  Reviewed risks/side effects of zostavax with patient.  She should check with insurance re: shingles vaccine.  May get same time as flu or a month apart.  Return when well for flu shot  F/u in 1-2 weeks if not better.  May need CXR if persistent cough

## 2012-11-27 NOTE — Patient Instructions (Signed)
Take the benzonatate as needed for cough (up to three times per day--it shouldn't make you sleepy). Start taking guaifenesin (robitussin or mucinex)--to help thin out the mucus. Take the prednisone twice daily for 5 days and the antibiotics for 5 days as directed.  Remember that the antibiotic continues to work for an additional 5 days after taking last pill. Follow up in 1-2 weeks if not improving

## 2012-12-04 ENCOUNTER — Telehealth: Payer: Self-pay | Admitting: Family Medicine

## 2012-12-04 NOTE — Telephone Encounter (Signed)
Please call pt.  If causing GI complaints, make sure she is taking a PPI (I think she has dexilant). If not tolerating, she can cut dose back to 20mg  once daily (instead of BID). See if her cough is improving

## 2012-12-04 NOTE — Telephone Encounter (Signed)
Please call concerning problems with prednisone.? Causing stomach pain and bloating, burning. Wants to talk to someone.

## 2012-12-04 NOTE — Telephone Encounter (Signed)
Spoke with patient and she finished the prednisone Fri evening, it was 10 pills given BID for 5 days. Since Friday she has been in excruciating pain. States that the pain is in the middle of abdomen near the middle(sternum?). She has also had terrible gas and bloating, she is taking her Dexilant and has daily, not helping. She said that she is having more BM's than usual, not diarrhea but the smell of her BM's is like nothing she has ever smelled before- unbearable, also look greasy. She has not had any fever but HA x 2 days and she never gets HA's. She is in a lot of pain and wants to know what she can do.

## 2012-12-04 NOTE — Telephone Encounter (Signed)
Tylenol for pain.  Bland diet (clears, advance to bland diet as tolerated).  She can take probiotics.  She can also try gas-X (simethicone) as needed for pain.  Needs OV for evaluation if persistent/worsening pain.  Is cough better?

## 2012-12-04 NOTE — Telephone Encounter (Signed)
Patient advised.

## 2012-12-14 ENCOUNTER — Other Ambulatory Visit (INDEPENDENT_AMBULATORY_CARE_PROVIDER_SITE_OTHER): Payer: BC Managed Care – PPO

## 2012-12-14 DIAGNOSIS — Z23 Encounter for immunization: Secondary | ICD-10-CM

## 2012-12-25 ENCOUNTER — Other Ambulatory Visit: Payer: Self-pay | Admitting: Family Medicine

## 2012-12-25 ENCOUNTER — Ambulatory Visit (INDEPENDENT_AMBULATORY_CARE_PROVIDER_SITE_OTHER): Payer: BC Managed Care – PPO | Admitting: Surgery

## 2012-12-25 ENCOUNTER — Encounter (INDEPENDENT_AMBULATORY_CARE_PROVIDER_SITE_OTHER): Payer: Self-pay | Admitting: Surgery

## 2012-12-25 VITALS — BP 130/78 | HR 72 | Temp 97.5°F | Resp 16 | Ht 64.5 in | Wt 143.2 lb

## 2012-12-25 DIAGNOSIS — G4700 Insomnia, unspecified: Secondary | ICD-10-CM | POA: Insufficient documentation

## 2012-12-25 DIAGNOSIS — K648 Other hemorrhoids: Secondary | ICD-10-CM

## 2012-12-25 DIAGNOSIS — K644 Residual hemorrhoidal skin tags: Secondary | ICD-10-CM

## 2012-12-25 NOTE — Telephone Encounter (Signed)
Sent to [facility]

## 2012-12-25 NOTE — Telephone Encounter (Signed)
Called med into pharmacy

## 2012-12-25 NOTE — Telephone Encounter (Signed)
Ok for #30 with 2 refills 

## 2012-12-25 NOTE — Telephone Encounter (Signed)
Is this okay to refill? 

## 2012-12-25 NOTE — Patient Instructions (Signed)
HEMORRHOIDS  The rectum is the last foot of your colon, and it naturally stretches to hold stool.  Hemorrhoidal piles are natural clusters of blood vessels that help the rectum and anal canal stretch to hold stool and allow bowel movements to eliminate feces.   Hemorrhoids are abnormally swollen blood vessels in the rectum.  Too much pressure in the rectum causes hemorrhoids by forcing blood to stretch and bulge the walls of the veins, sometimes even rupturing them.  Hemorrhoids can become like varicose veins you might see on a person's legs.  Most people will develop a flare of hemorrhoids in their lifetime.  When bulging hemorrhoidal veins are irritated, they can swell, burn, itch, cause pain, and bleed.  Most flares will calm down gradually own within a few weeks.  However, once hemorrhoids are created, they are difficult to get rid of completely and tend to flare more easily than the first flare.   Fortunately, good habits and simple medical treatment usually control hemorrhoids well, and surgery is needed only in severe cases. Types of Hemorrhoids:  Internal hemorrhoids usually don't initially hurt or itch; they are deep inside the rectum and usually have no sensation. If they begin to push out (prolapse), pain and burning can occur.  However, internal hemorrhoids can bleed.  Anal bleeding should not be ignored since bleeding could come from a dangerous source like colorectal cancer, so persistent rectal bleeding should be investigated by a doctor, sometimes with a colonoscopy.  External hemorrhoids cause most of the symptoms - pain, burning, and itching. Nonirritated hemorrhoids can look like small skin tags coming out of the anus.   Thrombosed hemorrhoids can form when a hemorrhoid blood vessel bursts and causes the hemorrhoid to suddenly swell.  A purple blood clot can form in it and become an excruciatingly painful lump at the anus. Because of these unpleasant symptoms, immediate incision and  drainage by a surgeon at an office visit can provide much relief of the pain.    PREVENTION Avoiding the most frequent causes listed below will prevent most cases of hemorrhoids: Constipation Hard stools Diarrhea  Constant sitting  Straining with bowel movements Sitting on the toilet for a long time  Severe coughing  episodes Pregnancy / Childbirth  Heavy Lifting  Sometimes avoiding the above triggers is difficult:  How can you avoid sitting all day if you have a seated job? Also, we try to avoid coughing and diarrhea, but sometimes it's beyond your control.  Still, there are some practical hints to help: Keep the anal and genital area clean.  Moistened tissues such as flushable wet wipes are less irritating than toilet paper.  Using irrigating showers or bottle irrigation washing gently cleans this sensitive area.   Avoid dry toilet paper when cleaning after bowel movements.  . Keep the anal and genital area dry.  Lightly pat the rectal area dry.  Avoid rubbing.  Talcum or baby powders can help GET YOUR STOOLS SOFT.   This is the most important way to prevent irritated hemorrhoids.  Hard stools are like sandpaper to the anorectal canal and will cause more problems.  The goal: ONE SOFT BOWEL MOVEMENT A DAY!  BMs from every other day to 3 times a day is a tolerable range Treat coughing, diarrhea and constipation early since irritated hemorrhoids may soon follow.  If your main job activity is seated, always stand or walk during your breaks. Make it a point to stand and walk at least 5 minutes every hour   and try to shift frequently in your chair to avoid direct rectal pressure.  Always exhale as you strain or lift. Don't hold your breath.  Do not delay or try to prevent a bowel movement when the urge is present. Exercise regularly (walking or jogging 60 minutes a day) to stimulate the bowels to move. No reading or other activity while on the toilet. If bowel movements take longer than 5 minutes,  you are too constipated. AVOID CONSTIPATION Drink plenty of liquids (1 1/2 to 2 quarts of water and other fluids a day unless fluid restricted for another medical condition). Liquids that contain caffeine (coffee a, tea, soft drinks) can be dehydrating and should be avoided until constipation is controlled. Consider minimizing milk, as dairy products may be constipating. Eat plenty of fiber (30g a day ideal, more if needed).  Fiber is the undigested part of plant food that passes into the colon, acting as "natures broom" to encourage bowel motility and movement.  Fiber can absorb and hold large amounts of water. This results in a larger, bulkier stool, which is soft and easier to pass.  Eating foods high in fiber - 12 servings - such as  Vegetables: Root (potatoes, carrots, turnips), Leafy green (lettuce, salad greens, celery, spinach), High residue (cabbage, broccoli, etc.) Fruit: Fresh, Dried (prunes, apricots, cherries), Stewed (applesauce)  Whole grain breads, pasta, whole wheat Bran cereals, muffins, etc. Consider adding supplemental bulking fiber which retains large volumes of water: Psyllium ground seeds (native plant from [place])--available as Metamucil, Konsyl, Effersyllium, Per Diem Fiber, or the less expensive generic forms.  Citrucel  (methylcellulose wood fiber) . FiberCon (Polycarbophil) Polyethylene Glycol - and "artificial" fiber commonly called Miralax or Glycolax.  It is helpful for people with gassy or bloated feelings with regular fiber Flax Seed - a less gassy natural fiber  Laxatives can be useful for a short period if constipation is severe Osmotics (Milk of Magnesia, Fleets Phospho-Soda, Magnesium Citrate)  Stimulants (Senokot,   Castor Oil,  Dulcolax, Ex-Lax)    Laxatives are not a good long-term solution as it can stress the bowels and cause too much mineral loss and dehydration.   Avoid taking laxatives for more than 7 days in a row.  AVOID DIARRHEA Switch to  liquids and simpler foods for a few days to avoid stressing your intestines further. Avoid dairy products (especially milk & ice cream) for a short time.  The intestines often can lose the ability to digest lactose when stressed. Avoid foods that cause gassiness or bloating.  Typical foods include beans and other legumes, cabbage, broccoli, and dairy foods.  Every person has some sensitivity to other foods, so listen to your body and avoid those foods that trigger problems for you. Adding fiber (Citrucel, Metamucil, FiberCon, Flax seed, Miralax) gradually can help thicken stools by absorbing excess fluid and retrain the intestines to act more normally.  Slowly increase the dose over a few weeks.  Too much fiber too soon can backfire and cause cramping & bloating. Probiotics (such as active yogurt, Align, etc) may help repopulate the intestines and colon with normal bacteria and calm down a sensitive digestive tract.  Most studies show it to be of mild help, though, and such products can be costly. Medicines: Bismuth subsalicylate (ex. Kayopectate, Pepto Bismol) every 30 minutes for up to 6 doses can help control diarrhea.  Avoid if pregnant. Loperamide (Immodium) can slow down diarrhea.  Start with two tablets (4mg total) first and then try one tablet   every 6 hours.  Avoid if you are having fevers or severe pain.  If you are not better or start feeling worse, stop all medicines and call your doctor for advice Call your doctor if you are getting worse or not better.  Sometimes further testing (cultures, endoscopy, X-ray studies, bloodwork, etc) may be needed to help diagnose and treat the cause of the diarrhea. TREATMENT OF HEMORRHOID FLARE If these preventive measures fail, you must take action right away! Hemorrhoids are one condition that can be mild in the morning and become intolerable by nightfall. Most hemorrhoidal flares take several weeks to calm down.  These suggestions can help: Warm soaks.   This helps more than any topical medication.  Use up to 8 times a day.  Usually sitz baths or sitting in a warm bathtub helps.  Sitting on moist warm towels are helpful.  Switching to ice packs/cool compresses can be helpful  Use a Sitz Bath 4-8 times a day for relief A sitz bath is a warm water bath taken in the sitting position that covers only the hips and buttocks. It may be used for either healing or hygiene purposes. Sitz baths are also used to relieve pain, itching, or muscle spasms. The water may contain medicine. Moist heat will help you heal and relax.  HOME CARE INSTRUCTIONS  Take 3 to 4 sitz baths a day. 1. Fill the bathtub half full with warm water. 2. Sit in the water and open the drain a little. 3. Turn on the warm water to keep the tub half full. Keep the water running constantly. 4. Soak in the water for 15 to 20 minutes. 5. After the sitz bath, pat the affected area dry first. SEEK MEDICAL CARE IF:  You get worse instead of better. Stop the sitz baths if you get worse.  Normalize your bowels.  Extremes of diarrhea or constipation will make hemorrhoids worse.  One soft bowel movement a day is the goal.  Fiber can help get your bowels regular Wet wipes instead of toilet paper Pain control with a NSAID such as ibuprofen (Advil) or naproxen (Aleve) or acetaminophen (Tylenol) around the clock.  Narcotics are constipating and should be minimized if possible Topical creams contain steroids (bydrocortisone) or local anesthetic (xylocaine) can help make pain and itching more tolerable.   EVALUATION If hemorrhoids are still causing problems, you could benefit by an evaluation by a surgeon.  The surgeon will obtain a history and examine you.  If hemorrhoids are diagnosed, some therapies can be offered in the office, usually with an anoscope into the less sensitive area of the rectum: -injection of hemorrhoids (sclerotherapy) can scar the blood vessels of the swollen/enlarged hemorrhoids to  help shrink them down to a more normal size -rubber banding of the enlarged hemorrhoids to help shrink them down to a more normal size -drainage of the blood clot causing a thrombosed hemorrhoid,  to relieve the severe pain   While 90% of the time such problems from hemorrhoids can be managed without preceding to surgery, sometimes the hemorrhoids require a operation to control the problem (uncontrolled bleeding, prolapse, pain, etc.).   This involves being placed under general anesthesia where the surgeon can confirm the diagnosis and remove, suture, or staple the hemorrhoid(s).  Your surgeon can help you treat the problem appropriately.    GETTING TO GOOD BOWEL HEALTH. Irregular bowel habits such as constipation and diarrhea can lead to many problems over time.  Having one soft bowel movement   a day is the most important way to prevent further problems.  The anorectal canal is designed to handle stretching and feces to safely manage our ability to get rid of solid waste (feces, poop, stool) out of our body.  BUT, hard constipated stools can act like ripping concrete bricks and diarrhea can be a burning fire to this very sensitive area of our body, causing inflamed hemorrhoids, anal fissures, increasing risk is perirectal abscesses, abdominal pain/bloating, an making irritable bowel worse.     The goal: ONE SOFT BOWEL MOVEMENT A DAY!  To have soft, regular bowel movements:    Drink at least 8 tall glasses of water a day.     Take plenty of fiber.  Fiber is the undigested part of plant food that passes into the colon, acting s "natures broom" to encourage bowel motility and movement.  Fiber can absorb and hold large amounts of water. This results in a larger, bulkier stool, which is soft and easier to pass. Work gradually over several weeks up to 6 servings a day of fiber (25g a day even more if needed) in the form of: o Vegetables -- Root (potatoes, carrots, turnips), leafy green (lettuce, salad greens,  celery, spinach), or cooked high residue (cabbage, broccoli, etc) o Fruit -- Fresh (unpeeled skin & pulp), Dried (prunes, apricots, cherries, etc ),  or stewed ( applesauce)  o Whole grain breads, pasta, etc (whole wheat)  o Bran cereals    Bulking Agents -- This type of water-retaining fiber generally is easily obtained each day by one of the following:  o Psyllium bran -- The psyllium plant is remarkable because its ground seeds can retain so much water. This product is available as Metamucil, Konsyl, Effersyllium, Per Diem Fiber, or the less expensive generic preparation in drug and health food stores. Although labeled a laxative, it really is not a laxative.  o Methylcellulose -- This is another fiber derived from wood which also retains water. It is available as Citrucel. o Polyethylene Glycol - and "artificial" fiber commonly called Miralax or Glycolax.  It is helpful for people with gassy or bloated feelings with regular fiber o Flax Seed - a less gassy fiber than psyllium   No reading or other relaxing activity while on the toilet. If bowel movements take longer than 5 minutes, you are too constipated   AVOID CONSTIPATION.  High fiber and water intake usually takes care of this.  Sometimes a laxative is needed to stimulate more frequent bowel movements, but    Laxatives are not a good long-term solution as it can wear the colon out. o Osmotics (Milk of Magnesia, Fleets phosphosoda, Magnesium citrate, MiraLax, GoLytely) are safer than  o Stimulants (Senokot, Castor Oil, Dulcolax, Ex Lax)    o Do not take laxatives for more than 7days in a row.    IF SEVERELY CONSTIPATED, try a Bowel Retraining Program: o Do not use laxatives.  o Eat a diet high in roughage, such as bran cereals and leafy vegetables.  o Drink six (6) ounces of prune or apricot juice each morning.  o Eat two (2) large servings of stewed fruit each day.  o Take one (1) heaping tablespoon of a psyllium-based bulking agent  twice a day. Use sugar-free sweetener when possible to avoid excessive calories.  o Eat a normal breakfast.  o Set aside 15 minutes after breakfast to sit on the toilet, but do not strain to have a bowel movement.  o If you do   not have a bowel movement by the third day, use an enema and repeat the above steps.    Controlling diarrhea o Switch to liquids and simpler foods for a few days to avoid stressing your intestines further. o Avoid dairy products (especially milk & ice cream) for a short time.  The intestines often can lose the ability to digest lactose when stressed. o Avoid foods that cause gassiness or bloating.  Typical foods include beans and other legumes, cabbage, broccoli, and dairy foods.  Every person has some sensitivity to other foods, so listen to our body and avoid those foods that trigger problems for you. o Adding fiber (Citrucel, Metamucil, psyllium, Miralax) gradually can help thicken stools by absorbing excess fluid and retrain the intestines to act more normally.  Slowly increase the dose over a few weeks.  Too much fiber too soon can backfire and cause cramping & bloating. o Probiotics (such as active yogurt, Align, etc) may help repopulate the intestines and colon with normal bacteria and calm down a sensitive digestive tract.  Most studies show it to be of mild help, though, and such products can be costly. o Medicines:   Bismuth subsalicylate (ex. Kayopectate, Pepto Bismol) every 30 minutes for up to 6 doses can help control diarrhea.  Avoid if pregnant.   Loperamide (Immodium) can slow down diarrhea.  Start with two tablets (4mg total) first and then try one tablet every 6 hours.  Avoid if you are having fevers or severe pain.  If you are not better or start feeling worse, stop all medicines and call your doctor for advice o Call your doctor if you are getting worse or not better.  Sometimes further testing (cultures, endoscopy, X-ray studies, bloodwork, etc) may be needed  to help diagnose and treat the cause of the diarrhea.  High-Fiber Diet Fiber is found in fruits, vegetables, and grains. A high-fiber diet encourages the addition of more whole grains, legumes, fruits, and vegetables in your diet. The recommended amount of fiber for adult males is 38 g per day. For adult females, it is 25 g per day. Pregnant and lactating women should get 28 g of fiber per day. If you have a digestive or bowel problem, ask your caregiver for advice before adding high-fiber foods to your diet. Eat a variety of high-fiber foods instead of only a select few type of foods.  PURPOSE  To increase stool bulk.  To make bowel movements more regular to prevent constipation.  To lower cholesterol.  To prevent overeating. WHEN IS THIS DIET USED?  It may be used if you have constipation and hemorrhoids.  It may be used if you have uncomplicated diverticulosis (intestine condition) and irritable bowel syndrome.  It may be used if you need help with weight management.  It may be used if you want to add it to your diet as a protective measure against atherosclerosis, diabetes, and cancer. SOURCES OF FIBER  Whole-grain breads and cereals.  Fruits, such as apples, oranges, bananas, berries, prunes, and pears.  Vegetables, such as green peas, carrots, sweet potatoes, beets, broccoli, cabbage, spinach, and artichokes.  Legumes, such split peas, soy, lentils.  Almonds. FIBER CONTENT IN FOODS Starches and Grains / Dietary Fiber (g)  Cheerios, 1 cup / 3 g  Corn Flakes cereal, 1 cup / 0.7 g  Rice crispy treat cereal, 1 cup / 0.3 g  Instant oatmeal (cooked),  cup / 2 g  Frosted wheat cereal, 1 cup / 5.1 g    Brown, long-grain rice (cooked), 1 cup / 3.5 g  White, long-grain rice (cooked), 1 cup / 0.6 g  Enriched macaroni (cooked), 1 cup / 2.5 g Legumes / Dietary Fiber (g)  Baked beans (canned, plain, or vegetarian),  cup / 5.2 g  Kidney beans (canned),  cup / 6.8  g  Pinto beans (cooked),  cup / 5.5 g Breads and Crackers / Dietary Fiber (g)  Plain or honey graham crackers, 2 squares / 0.7 g  Saltine crackers, 3 squares / 0.3 g  Plain, salted pretzels, 10 pieces / 1.8 g  Whole-wheat bread, 1 slice / 1.9 g  White bread, 1 slice / 0.7 g  Raisin bread, 1 slice / 1.2 g  Plain bagel, 3 oz / 2 g  Flour tortilla, 1 oz / 0.9 g  Corn tortilla, 1 small / 1.5 g  Hamburger or hotdog bun, 1 small / 0.9 g Fruits / Dietary Fiber (g)  Apple with skin, 1 medium / 4.4 g  Sweetened applesauce,  cup / 1.5 g  Banana,  medium / 1.5 g  Grapes, 10 grapes / 0.4 g  Orange, 1 small / 2.3 g  Raisin, 1.5 oz / 1.6 g  Melon, 1 cup / 1.4 g Vegetables / Dietary Fiber (g)  Green beans (canned),  cup / 1.3 g  Carrots (cooked),  cup / 2.3 g  Broccoli (cooked),  cup / 2.8 g  Peas (cooked),  cup / 4.4 g  Mashed potatoes,  cup / 1.6 g  Lettuce, 1 cup / 0.5 g  Corn (canned),  cup / 1.6 g  Tomato,  cup / 1.1 g Document Released: 02/22/2005 Document Revised: 08/24/2011 Document Reviewed: 05/27/2011 [employer] Patient Information 2014 [employer].  

## 2012-12-25 NOTE — Progress Notes (Signed)
Subjective:     Patient ID: [name], female   DOB: May 13, 1950, 63 y.o.   MRN: [id number]  HPI   BRADLEY HANDYSIDE  [birth year] [id number]  Patient Care Team: Ronnald Nian, MD as PCP - General (Family Medicine) Joselyn Arrow, MD as Attending Physician (Family Medicine) Griffith Citron, MD as Consulting Physician (Gastroenterology)  Procedure (Date: 10/20/2012): [facility] hemorrhoidal ligation/pexy.  External hemorrhoidectomy x1  This patient returns for surgical evaluation.    She is doing [facility] better.  Soreness is less.  Using fiber supp with better BMs more soft.    No fevers or chills.    Patient Active Problem List   Diagnosis Date Noted  . Insomnia 12/25/2012  . External hemorrhoids s/p ext hemorrhoidectomy 10/20/2012 11/07/2012  . Internal prolapsed hemorrhoids s/p THD hemorrhoidal ligation/pexy 10/20/2012 10/16/2012  . Dyslipidemia 09/26/2012  . GERD (gastroesophageal reflux disease) 07/24/2012  . Barrett's esophagus 07/24/2012  . Cough 12/22/2011  . Osteopenia 07/27/2010  . Pure hypercholesterolemia 07/27/2010    Past Medical History  Diagnosis Date  . Barrett's esophagus     last EGD 2011 in [facility]  . External hemorrhoids 2012    getting banding every other week with Dr. Kinnie Scales   . Osteopenia   . Impaired fasting glucose   . Increased serum lipids   . Hyperlipidemia   . Internal prolapsed hemorrhoids s/p THD hemorrhoidal ligation/pexy 10/16/2012    Past Surgical History  Procedure Laterality Date  . Cholecystectomy  2010  . Abdominal hysterectomy  1984    1 ovary remains; removed for "cancer cells"  . Transanal hemorrhoidal dearterialization  10/20/2012    hemorrhoidal ligation and pexy.  [name] Posterolateral external hemorrhoidactomy  10/20/2012    left    History   Social History  . Marital Status: Married    Spouse Name: N/A    Number of Children: 2  . Years of Education: N/A   Occupational History  . 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 Smoking status: Former Smoker -- 0.30 packs/day for 20 years    Types: Cigarettes    Quit date: 01/24/2009  . Smokeless tobacco: Never Used  . Alcohol Use: No  . Drug Use: No  . Sexual Activity: Not on file   Other Topics Concern  . Not on file   Social History Narrative  . No narrative on file    Family History  Problem Relation Age of Onset  . Cancer Mother 55    breast cancer  . COPD Father   . Heart disease Father     CHF  . Atrial fibrillation Father   . Angelman syndrome Daughter   . Seizures Daughter   . Cancer Paternal Grandmother     stomach  . Thyroid disease Sister     Current Outpatient Prescriptions  Medication Sig Dispense Refill  . Ascorbic Acid (VITAMIN C) 100 MG tablet Take 100 mg by mouth daily.        . Calcium Carbonate-Vitamin D (CALCIUM + D PO) Take by mouth 2 (two) times daily.        . cholecalciferol (VITAMIN D) 1000 UNITS tablet Take 2,000 Units by mouth daily.       . hydrocortisone (ANUSOL-HC) 2.5 % rectal cream Place rectally 2 (two) times daily.  60 g  1  . Probiotic Product (SOLUBLE FIBER/PROBIOTICS PO) Take 1 tablet by mouth daily.       [name] Specialty Vitamins Products (MAGNESIUM,  AMINO ACID CHELATE,) 133 MG tablet Take 1 tablet by mouth 2 (two) times daily.      [name] zolpidem (AMBIEN CR) 12.5 MG CR tablet Take 1 tablet (12.5 mg total) by mouth at bedtime as needed for sleep.  30 tablet  2   No current facility-administered medications for this visit.     Allergies  Allergen Reactions  . Codeine   . Vicodin [Hydrocodone-Acetaminophen] Itching and Nausea And Vomiting    BP 130/78  Pulse 72  Temp(Src) 97.5 F (36.4 C) (Temporal)  Resp 16  Ht 5' 4.5" (1.638 m)  Wt 143 lb 3.2 oz (64.955 kg)  BMI 24.21 kg/m2  Mm Digital Screening  10/12/2012   *RADIOLOGY REPORT*  Clinical Data: Screening.  DIGITAL SCREENING BILATERAL MAMMOGRAM WITH CAD DIGITAL BREAST TOMOSYNTHESIS  Digital breast tomosynthesis images are acquired in two  projections.  These images are reviewed in combination with the digital mammogram, confirming the findings below.  Comparison:  Previous exam(s).  FINDINGS:  ACR Breast Density Category c:  The breast tissue is heterogeneously dense, which may obscure small masses.  There are no findings suspicious for malignancy.  Images were processed with CAD.  IMPRESSION: No mammographic evidence of malignancy.  A result letter of this screening mammogram will be mailed directly to the patient.  RECOMMENDATION: Screening mammogram in one year. (Code:SM-B-01Y)  BI-RADS CATEGORY 1:  Negative.   Original Report Authenticated By: Edwin Cap, M.D.    Review of Systems  Constitutional: Negative for fever, chills and diaphoresis.  HENT: Negative for ear pain, rhinorrhea and trouble swallowing.   Eyes: Negative for photophobia and visual disturbance.  Respiratory: Negative for cough and choking.   Cardiovascular: Negative for chest pain and palpitations.  Gastrointestinal: Positive for rectal pain. Negative for nausea, vomiting, abdominal pain, diarrhea and constipation.  Genitourinary: Negative for dysuria, urgency, frequency, hematuria, decreased urine volume, vaginal bleeding, vaginal discharge, difficulty urinating, vaginal pain and pelvic pain.  Musculoskeletal: Negative for gait problem and myalgias.  Skin: Negative for color change, pallor and rash.  Neurological: Negative for dizziness, speech difficulty, weakness and numbness.  Hematological: Negative for adenopathy.  Psychiatric/Behavioral: Negative for confusion and agitation. The patient is not nervous/anxious.        Objective:   Physical Exam  Constitutional: She is oriented to person, place, and time. She appears well-developed and well-nourished. No distress.  HENT:  Head: Normocephalic.  Mouth/Throat: Uvula is midline, oropharynx is clear and moist and mucous membranes are normal. No oropharyngeal exudate, posterior oropharyngeal edema,  posterior oropharyngeal erythema or tonsillar abscesses.  No thrush or Candida.  Eyes: Conjunctivae and EOM are normal. Pupils are equal, round, and reactive to light. No scleral icterus.  Neck: Normal range of motion. No tracheal deviation present.  Cardiovascular: Normal rate and intact distal pulses.   Pulmonary/Chest: Effort normal. No respiratory distress. She exhibits no tenderness.  Abdominal: Soft. She exhibits no distension. There is no tenderness. Hernia confirmed negative in the right inguinal area and confirmed negative in the left inguinal area.  Genitourinary: Rectal exam shows external hemorrhoid. No vaginal discharge found.  Exam done with assistance of female Medical Assistant in the room.  Perianal skin clean with good hygiene.  No pruritis.  No pilonidal disease.  No fissure.  No abscess/fistula. Normal sphincter tone.   Right and anterior ext hem moderate size.  No bleeding.  Internal piles enlarged.  Musculoskeletal: Normal range of motion. She exhibits no tenderness.  Lymphadenopathy:       Right:  No inguinal adenopathy present.       Left: No inguinal adenopathy present.  Neurological: She is alert and oriented to person, place, and time. No cranial nerve deficit. She exhibits normal muscle tone. Coordination normal.  Skin: Skin is warm and dry. No rash noted. She is not diaphoretic.  Psychiatric: She has a normal mood and affect. Her behavior is normal.       Assessment:     Moderate external hemorrhoids but much improved with internal hemorrhoids 2 months status post THD hemorrhoidal ligation and pexy    Plan:     I am disappointed that she has persistent external hemorrhoids, but she seems quite happy That she is improved.  That being said, she wishes to remove the remaining external hemorrhoids.  She is concerned that they will get worse despite being in a better place with her bowel regimen.  Sometimes it is hard to keep the area clean and can get irritated.  She  has proved to me that her bowels are under better control.  She is hopeful not hurt as bad as last time.  I cautioned her this is very sensitive surgery  The anatomy & physiology of the anorectal region was discussed.  The pathophysiology of hemorrhoids and differential diagnosis was discussed.  Natural history risks without surgery was discussed.   I stressed the importance of a bowel regimen to have daily soft bowel movements to minimize progression of disease.  Interventions such as sclerotherapy & banding were discussed.  The patient's symptoms are not adequately controlled by medicines and other non-operative treatments.  I feel the risks & problems of no surgery outweigh the operative risks; therefore, I recommended surgery to treat the hemorrhoids by ligation, pexy, and possible resection.  Risks such as bleeding, infection, urinary difficulties, need for further treatment, heart attack, death, and other risks were discussed.   I noted a good likelihood this will help address the problem.  Goals of post-operative recovery were discussed as well.  Possibility that this will not correct all symptoms was explained.  Post-operative pain, bleeding, constipation, and other problems after surgery were discussed.  We will work to minimize complications.   Educational handouts further explaining the pathology, treatment options, and bowel regimen were given as well.  Questions were answered.  The patient expresses understanding & wishes to proceed with surgery.    Increase activity as tolerated to regular activity.  Low impact exercise such as walking an hour a day at least ideal.  Do not push through pain.  She can try vaginal sex, but I suspect it will be too uncomfortable.  Would wait at least two if not for more weeks.  Diet as tolerated.  Low fat high fiber diet ideal.  Increase her fiber supplementation with a bowel regimen with 30 g fiber a day and fiber supplement as needed to avoid  problems.  The anatomy & physiology of the anorectal region was discussed.  The pathophysiology of hemorrhoids and differential diagnosis was discussed.  Natural history progression  was discussed.   I stressed the importance of a bowel regimen to have daily soft bowel movements to minimize progression of disease.   Goal of one BM / day ideal.  Use of wet wipes, warm baths, avoiding straining, etc were emphasized.  Educational handouts further explaining the pathology, treatment options, and bowel regimen were given as well.   The patient expressed understanding.  Instructions discussed.  Followup with primary care physician for other health issues as  would normally be done.  Questions answered.  The patient expressed understanding and appreciation

## 2013-01-15 ENCOUNTER — Encounter (HOSPITAL_COMMUNITY): Payer: Self-pay | Admitting: Pharmacy Technician

## 2013-01-18 ENCOUNTER — Encounter (HOSPITAL_COMMUNITY): Payer: Self-pay

## 2013-01-18 ENCOUNTER — Encounter (HOSPITAL_COMMUNITY)
Admission: RE | Admit: 2013-01-18 | Discharge: 2013-01-18 | Disposition: A | Discharge status: Home / Self Care | Payer: BC Managed Care – PPO | Source: Ambulatory Visit | Attending: Surgery | Admitting: Surgery

## 2013-01-18 DIAGNOSIS — Z01812 Encounter for preprocedural laboratory examination: Secondary | ICD-10-CM | POA: Insufficient documentation

## 2013-01-18 HISTORY — DX: Other specified postprocedural states: Z98.890

## 2013-01-18 HISTORY — DX: Nausea with vomiting, unspecified: R11.2

## 2013-01-18 HISTORY — DX: Gastro-esophageal reflux disease without esophagitis: K21.9

## 2013-01-18 LAB — CBC
Hemoglobin: 14.5 g/dL (ref 12.0–15.0)
Platelets: 220 10*3/uL (ref 150–400)
RBC: 4.96 MIL/uL (ref 3.87–5.11)
WBC: 5.3 10*3/uL (ref 4.0–10.5)

## 2013-01-18 NOTE — Patient Instructions (Signed)
YOUR SURGERY IS SCHEDULED AT [facility]  ON:  Thursday  11/20  REPORT TO  SHORT STAY CENTER AT:  12:00 PM      PHONE # FOR SHORT STAY IS [phone]  FOLLOW YOUR RECTAL PREP INSTRUCTIONS FROM DR'S OFFICE.  DO NOT EAT  ANYTHING AFTER MIDNIGHT THE NIGHT BEFORE YOUR SURGERY.   NO FOOD, NO CHEWING GUM, NO MINTS, NO CANDIES, NO CHEWING TOBACCO. YOU MAY HAVE CLEAR LIQUIDS TO DRINK FROM MIDNIGHT UNTIL 8:00 AM DAY OF SURGERY - LIKE WATER, COFFEE ( NO MILK OR MILK PRODUCTS).   NOTHING TO DRINK AFTER 8:00 AM DAY OF SURGERY.  PLEASE TAKE THE FOLLOWING MEDICATIONS THE AM OF YOUR SURGERY WITH A FEW SIPS OF WATER:  NEXIUM   DO NOT BRING VALUABLES, MONEY, CREDIT CARDS.  DO NOT WEAR JEWELRY, MAKE-UP, NAIL POLISH AND NO METAL PINS OR CLIPS IN YOUR HAIR. CONTACT LENS, DENTURES / PARTIALS, GLASSES SHOULD NOT BE WORN TO SURGERY AND IN MOST CASES-HEARING AIDS WILL NEED TO BE REMOVED.  BRING YOUR GLASSES CASE, ANY EQUIPMENT NEEDED FOR YOUR CONTACT LENS. FOR PATIENTS ADMITTED TO THE HOSPITAL--CHECK OUT TIME THE DAY OF DISCHARGE IS 11:00 AM.  ALL INPATIENT ROOMS ARE PRIVATE - WITH BATHROOM, TELEPHONE, TELEVISION AND WIFI INTERNET.  IF YOU ARE BEING DISCHARGED THE SAME DAY OF YOUR SURGERY--YOU CAN NOT DRIVE YOURSELF HOME--AND SHOULD NOT GO HOME ALONE BY TAXI OR BUS.  NO DRIVING OR OPERATING MACHINERY, DO NOT MAKE LEGAL DECISIONS FOR 24 HOURS FOLLOWING ANESTHESIA / PAIN MEDICATIONS.  PLEASE MAKE ARRANGEMENTS FOR SOMEONE TO BE WITH YOU AT HOME THE FIRST 24 HOURS AFTER SURGERY. RESPONSIBLE DRIVER'S NAME / PHONE  PT'S SISTER SHIRLEY STAFFORD  [phone]                                                    FAILURE TO FOLLOW THESE INSTRUCTIONS MAY RESULT IN THE CANCELLATION OF YOUR SURGERY. PLEASE BE AWARE THAT YOU MAY NEED ADDITIONAL BLOOD DRAWN DAY OF YOUR SURGERY  PATIENT SIGNATURE_________________________________

## 2013-01-18 NOTE — Pre-Procedure Instructions (Addendum)
EKG AND CXR NOT NEEDED PREOP - PER ANESTHESIOLOGIST'S GUIDELINES. PT'S ANESTHESIA RECORD FROM [facility] ON CHART - PT STATES SHE HAD N&V AFTER THIS SURGERY.

## 2013-01-25 ENCOUNTER — Ambulatory Visit (HOSPITAL_COMMUNITY)
Admission: RE | Admit: 2013-01-25 | Discharge: 2013-01-25 | Disposition: A | Discharge status: Home / Self Care | Payer: BC Managed Care – PPO | Source: Ambulatory Visit | Attending: Surgery | Admitting: Surgery

## 2013-01-25 ENCOUNTER — Encounter (HOSPITAL_COMMUNITY)
Admission: RE | Disposition: A | Discharge status: Home / Self Care | Payer: Self-pay | Source: Ambulatory Visit | Attending: Surgery

## 2013-01-25 ENCOUNTER — Encounter (HOSPITAL_COMMUNITY): Payer: Self-pay

## 2013-01-25 ENCOUNTER — Encounter (HOSPITAL_COMMUNITY): Payer: BC Managed Care – PPO | Admitting: Anesthesiology

## 2013-01-25 ENCOUNTER — Ambulatory Visit (HOSPITAL_COMMUNITY): Payer: BC Managed Care – PPO | Admitting: Anesthesiology

## 2013-01-25 DIAGNOSIS — K648 Other hemorrhoids: Secondary | ICD-10-CM | POA: Insufficient documentation

## 2013-01-25 DIAGNOSIS — K644 Residual hemorrhoidal skin tags: Secondary | ICD-10-CM | POA: Insufficient documentation

## 2013-01-25 DIAGNOSIS — Z9089 Acquired absence of other organs: Secondary | ICD-10-CM | POA: Insufficient documentation

## 2013-01-25 DIAGNOSIS — K219 Gastro-esophageal reflux disease without esophagitis: Secondary | ICD-10-CM | POA: Insufficient documentation

## 2013-01-25 DIAGNOSIS — Z90710 Acquired absence of both cervix and uterus: Secondary | ICD-10-CM | POA: Insufficient documentation

## 2013-01-25 DIAGNOSIS — Z87891 Personal history of nicotine dependence: Secondary | ICD-10-CM | POA: Insufficient documentation

## 2013-01-25 HISTORY — PX: EVALUATION UNDER ANESTHESIA WITH HEMORRHOIDECTOMY: SHX5624

## 2013-01-25 SURGERY — EXAM UNDER ANESTHESIA WITH HEMORRHOIDECTOMY
Anesthesia: General | Site: Rectum | Wound class: Contaminated

## 2013-01-25 MED ORDER — SODIUM CHLORIDE 0.9 % IJ SOLN: 3.0000 mL | Freq: Two times a day (BID) | INTRAMUSCULAR | Status: DC

## 2013-01-25 MED ORDER — PROMETHAZINE HCL 25 MG/ML IJ SOLN: 6.2500 mg | INTRAMUSCULAR | Status: DC | PRN

## 2013-01-25 MED ORDER — ACETAMINOPHEN 500 MG PO TABS
1000.0000 mg | ORAL_TABLET | Freq: Three times a day (TID) | ORAL | Status: DC
  Filled 2013-01-25 (×3): qty 2

## 2013-01-25 MED ORDER — FENTANYL CITRATE 0.05 MG/ML IJ SOLN
INTRAMUSCULAR | Status: AC
  Filled 2013-01-25: qty 2

## 2013-01-25 MED ORDER — OXYCODONE HCL 5 MG PO TABS: 5.0000 mg | ORAL_TABLET | ORAL | Status: DC | PRN

## 2013-01-25 MED ORDER — MIDAZOLAM HCL 2 MG/2ML IJ SOLN
INTRAMUSCULAR | Status: AC
  Filled 2013-01-25: qty 2

## 2013-01-25 MED ORDER — PROPOFOL 10 MG/ML IV BOLUS
INTRAVENOUS | Status: AC
  Filled 2013-01-25: qty 20

## 2013-01-25 MED ORDER — GLYCOPYRROLATE 0.2 MG/ML IJ SOLN
INTRAMUSCULAR | Status: AC
  Filled 2013-01-25: qty 1

## 2013-01-25 MED ORDER — PROPOFOL 10 MG/ML IV BOLUS
INTRAVENOUS | Status: DC | PRN
  Administered 2013-01-25: 170 mg via INTRAVENOUS

## 2013-01-25 MED ORDER — SODIUM CHLORIDE 0.9 % IV SOLN
2.0000 g | INTRAVENOUS | Status: DC | PRN
  Administered 2013-01-25: 2 g via INTRAVENOUS

## 2013-01-25 MED ORDER — SODIUM CHLORIDE 0.9 % IJ SOLN: 3.0000 mL | INTRAMUSCULAR | Status: DC | PRN

## 2013-01-25 MED ORDER — FENTANYL CITRATE 0.05 MG/ML IJ SOLN: 25.0000 ug | INTRAMUSCULAR | Status: DC | PRN

## 2013-01-25 MED ORDER — 0.9 % SODIUM CHLORIDE (POUR BTL) OPTIME
TOPICAL | Status: DC | PRN
  Administered 2013-01-25: 1000 mL

## 2013-01-25 MED ORDER — GLYCOPYRROLATE 0.2 MG/ML IJ SOLN
INTRAMUSCULAR | Status: DC | PRN
  Administered 2013-01-25: 0.6 mg via INTRAVENOUS

## 2013-01-25 MED ORDER — HYDROMORPHONE HCL PF 1 MG/ML IJ SOLN
0.2500 mg | INTRAMUSCULAR | Status: DC | PRN
  Administered 2013-01-25 (×2): 0.5 mg via INTRAVENOUS

## 2013-01-25 MED ORDER — OXYCODONE HCL 5 MG PO TABS
5.0000 mg | ORAL_TABLET | ORAL | Status: DC | PRN
  Administered 2013-01-25: 5 mg via ORAL
  Filled 2013-01-25: qty 1

## 2013-01-25 MED ORDER — ONDANSETRON HCL 4 MG/2ML IJ SOLN
INTRAMUSCULAR | Status: AC
  Filled 2013-01-25: qty 2

## 2013-01-25 MED ORDER — LIDOCAINE HCL (CARDIAC) 20 MG/ML IV SOLN
INTRAVENOUS | Status: DC | PRN
  Administered 2013-01-25: 50 mg via INTRAVENOUS

## 2013-01-25 MED ORDER — ROCURONIUM BROMIDE 100 MG/10ML IV SOLN
INTRAVENOUS | Status: DC | PRN
  Administered 2013-01-25: 40 mg via INTRAVENOUS

## 2013-01-25 MED ORDER — METOCLOPRAMIDE HCL 5 MG/ML IJ SOLN
INTRAMUSCULAR | Status: AC
  Filled 2013-01-25: qty 2

## 2013-01-25 MED ORDER — DIBUCAINE 1 % RE OINT
TOPICAL_OINTMENT | RECTAL | Status: AC
  Filled 2013-01-25: qty 28

## 2013-01-25 MED ORDER — CEFOXITIN SODIUM 1 G IV SOLR
INTRAVENOUS | Status: AC
  Filled 2013-01-25 (×2): qty 1

## 2013-01-25 MED ORDER — LIDOCAINE HCL (CARDIAC) 20 MG/ML IV SOLN
INTRAVENOUS | Status: AC
  Filled 2013-01-25: qty 5

## 2013-01-25 MED ORDER — LACTATED RINGERS IV SOLN
INTRAVENOUS | Status: DC | PRN
  Administered 2013-01-25 (×2): via INTRAVENOUS

## 2013-01-25 MED ORDER — LACTATED RINGERS IV SOLN: INTRAVENOUS | Status: DC

## 2013-01-25 MED ORDER — NAPROXEN 500 MG PO TABS: 500.0000 mg | ORAL_TABLET | Freq: Two times a day (BID) | ORAL | Status: DC

## 2013-01-25 MED ORDER — DEXAMETHASONE SODIUM PHOSPHATE 10 MG/ML IJ SOLN
INTRAMUSCULAR | Status: DC | PRN
  Administered 2013-01-25: 5 mg via INTRAVENOUS

## 2013-01-25 MED ORDER — ONDANSETRON HCL 4 MG/2ML IJ SOLN
INTRAMUSCULAR | Status: DC | PRN
  Administered 2013-01-25: 4 mg via INTRAVENOUS

## 2013-01-25 MED ORDER — MIDAZOLAM HCL 5 MG/5ML IJ SOLN
INTRAMUSCULAR | Status: DC | PRN
  Administered 2013-01-25: 2 mg via INTRAVENOUS

## 2013-01-25 MED ORDER — ONDANSETRON HCL 4 MG/2ML IJ SOLN: 4.0000 mg | Freq: Four times a day (QID) | INTRAMUSCULAR | Status: DC | PRN

## 2013-01-25 MED ORDER — SODIUM CHLORIDE 0.9 % IJ SOLN
INTRAMUSCULAR | Status: DC | PRN
  Administered 2013-01-25: 10 mL via INTRAVENOUS

## 2013-01-25 MED ORDER — DEXAMETHASONE SODIUM PHOSPHATE 10 MG/ML IJ SOLN
INTRAMUSCULAR | Status: AC
  Filled 2013-01-25: qty 1

## 2013-01-25 MED ORDER — SODIUM CHLORIDE 0.9 % IV SOLN: 250.0000 mL | INTRAVENOUS | Status: DC | PRN

## 2013-01-25 MED ORDER — NEOSTIGMINE METHYLSULFATE 1 MG/ML IJ SOLN
INTRAMUSCULAR | Status: AC
  Filled 2013-01-25: qty 10

## 2013-01-25 MED ORDER — FENTANYL CITRATE 0.05 MG/ML IJ SOLN
INTRAMUSCULAR | Status: DC | PRN
  Administered 2013-01-25 (×2): 50 ug via INTRAVENOUS
  Administered 2013-01-25: 100 ug via INTRAVENOUS

## 2013-01-25 MED ORDER — BUPIVACAINE LIPOSOME 1.3 % IJ SUSP
20.0000 mL | Freq: Once | INTRAMUSCULAR | Status: AC
  Administered 2013-01-25: 20 mL
  Filled 2013-01-25: qty 20

## 2013-01-25 MED ORDER — METOCLOPRAMIDE HCL 5 MG/ML IJ SOLN
INTRAMUSCULAR | Status: DC | PRN
  Administered 2013-01-25: 10 mg via INTRAVENOUS

## 2013-01-25 MED ORDER — HYDROMORPHONE HCL PF 1 MG/ML IJ SOLN
INTRAMUSCULAR | Status: AC
  Filled 2013-01-25: qty 1

## 2013-01-25 MED ORDER — SODIUM CHLORIDE 0.9 % IJ SOLN
INTRAMUSCULAR | Status: AC
  Filled 2013-01-25: qty 50

## 2013-01-25 MED ORDER — NEOSTIGMINE METHYLSULFATE 1 MG/ML IJ SOLN
INTRAMUSCULAR | Status: DC | PRN
  Administered 2013-01-25: 4 mg via INTRAVENOUS

## 2013-01-25 SURGICAL SUPPLY — 33 items
BLADE HEX COATED 2.75 (ELECTRODE) ×2 IMPLANT
BLADE SURG 15 STRL LF DISP TIS (BLADE) ×1 IMPLANT
BLADE SURG 15 STRL SS (BLADE) ×2
BRIEF STRETCH FOR OB PAD LRG (UNDERPADS AND DIAPERS) IMPLANT
CANISTER SUCTION 2500CC (MISCELLANEOUS) ×2 IMPLANT
DECANTER SPIKE VIAL GLASS SM (MISCELLANEOUS) ×2 IMPLANT
DRAPE LAPAROTOMY T 102X78X121 (DRAPES) IMPLANT
DRSG PAD ABDOMINAL 8X10 ST (GAUZE/BANDAGES/DRESSINGS) IMPLANT
ELECT REM PT RETURN 9FT ADLT (ELECTROSURGICAL) ×2
ELECTRODE REM PT RTRN 9FT ADLT (ELECTROSURGICAL) ×1 IMPLANT
GAUZE SPONGE 4X4 16PLY XRAY LF (GAUZE/BANDAGES/DRESSINGS) ×2 IMPLANT
GLOVE BIOGEL PI IND STRL 7.5 (GLOVE) IMPLANT
GLOVE BIOGEL PI IND STRL 8 (GLOVE) IMPLANT
GLOVE BIOGEL PI INDICATOR 7.5 (GLOVE) ×1
GLOVE BIOGEL PI INDICATOR 8 (GLOVE) ×1
GLOVE ECLIPSE 8.0 STRL XLNG CF (GLOVE) ×2 IMPLANT
GLOVE INDICATOR 8.0 STRL GRN (GLOVE) ×2 IMPLANT
GOWN PREVENTION PLUS XXLARGE (GOWN DISPOSABLE) ×1 IMPLANT
GOWN STRL REIN XL XLG (GOWN DISPOSABLE) ×4 IMPLANT
KIT BASIN OR (CUSTOM PROCEDURE TRAY) ×2 IMPLANT
LUBRICANT JELLY K Y 4OZ (MISCELLANEOUS) ×2 IMPLANT
NEEDLE HYPO 22GX1.5 SAFETY (NEEDLE) ×2 IMPLANT
NS IRRIG 1000ML POUR BTL (IV SOLUTION) ×2 IMPLANT
PACK BASIC VI WITH GOWN DISP (CUSTOM PROCEDURE TRAY) ×2 IMPLANT
PENCIL BUTTON HOLSTER BLD 10FT (ELECTRODE) ×2 IMPLANT
SPONGE GAUZE 4X4 12PLY (GAUZE/BANDAGES/DRESSINGS) IMPLANT
SUT CHROMIC 2 0 SH (SUTURE) IMPLANT
SUT CHROMIC 3 0 SH 27 (SUTURE) IMPLANT
SUT VIC AB 2-0 UR6 27 (SUTURE) ×3 IMPLANT
SYR 20CC LL (SYRINGE) ×2 IMPLANT
TOWEL OR 17X26 10 PK STRL BLUE (TOWEL DISPOSABLE) ×2 IMPLANT
TOWEL OR NON WOVEN STRL DISP B (DISPOSABLE) ×2 IMPLANT
YANKAUER SUCT BULB TIP 10FT TU (MISCELLANEOUS) ×2 IMPLANT

## 2013-01-25 NOTE — Transfer of Care (Signed)
Immediate Anesthesia Transfer of Care Note  Patient: Anna Mckinney  Procedure(s) Performed: Procedure(s) (LRB): EXAM UNDER ANESTHESIA WITH HEMORRHOIDECTOMY (N/A)  Patient Location: PACU  Anesthesia Type: General  Level of Consciousness: sedated, patient cooperative and responds to stimulation  Airway & Oxygen Therapy: Patient Spontanous Breathing and Patient connected to face mask oxgen  Post-op Assessment: Report given to PACU RN and Post -op Vital signs reviewed and stable  Post vital signs: Reviewed and stable  Complications: No apparent anesthesia complications

## 2013-01-25 NOTE — Op Note (Signed)
01/25/2013  2:27 PM  PATIENT:  Anna Mckinney  62 y.o. female  Patient Care Team: Ronnald Nian, MD as PCP - General (Family Medicine) Joselyn Arrow, MD as Attending Physician (Family Medicine) Griffith Citron, MD as Consulting Physician (Gastroenterology)  PRE-OPERATIVE DIAGNOSIS:  Symptomatic external hemorrhoids   POST-OPERATIVE DIAGNOSIS:  Symptomatic external hemorrhoids  PROCEDURE:  Procedure(s):  Examination under anesthesia Removal of int/external hemorrhoids (R anterior & posterior)   SURGEON:  Surgeon(s): Ardeth Sportsman, MD  ANESTHESIA:    General anesthesia. Anorectal block using liposomal bupivacaine  EBL:  Total I/O In: 1000 [I.V.:1000] Out: -   Delay start of Pharmacological VTE agent (>24hrs) due to surgical blood loss or risk of bleeding:  NO  DRAINS: NONE  SPECIMEN:  Source of Specimen:  Hemorrhoids x 2  DISPOSITION OF SPECIMEN:  PATHOLOGY  COUNTS:  YES  PLAN OF CARE: Discharge home after PACU  PATIENT DISPOSITION:  PACU - hemodynamically stable.  INDICATION: Pleasant patient with struggles with external hemorrhoids.  Not able to be managed in the office despite an improved bowel regimen & prior internal hemorrhoid ligation.  I recommended examination under anesthesia and surgical treatment:  The anatomy & physiology of the anorectal region was discussed.  The pathophysiology of hemorrhoids and differential diagnosis was discussed.  Natural history risks without surgery was discussed.   I stressed the importance of a bowel regimen to have daily soft bowel movements to minimize progression of disease.  Interventions such as sclerotherapy & banding were discussed.  The patient's symptoms are not adequately controlled by medicines and other non-operative treatments.  I feel the risks & problems of no surgery outweigh the operative risks; therefore, I recommended surgery to treat the hemorrhoids by ligation, pexy, and possible resection.  Risks such  as bleeding, infection, need for further treatment, heart attack, death, and other risks were discussed.   I noted a good likelihood this will help address the problem.  Goals of post-operative recovery were discussed as well.  Possibility that this will not correct all symptoms was explained.  Post-operative pain, bleeding, constipation, urinary difficulties, and other problems after surgery were discussed.  We will work to minimize complications.   Educational handouts further explaining the pathology, treatment options, and bowel regimen were given as well.  Questions were answered.  The patient expresses understanding & wishes to proceed with surgery.   OR FINDINGS: Enlarged right anterior and posterior internal/external hemorrhoids - excised.  Mild anterior external hemorrhoid at the midline raphae - excised.  Left circumference with no external skin tag/hemorrhoids  DESCRIPTION:   Informed consent was confirmed. Patient underwent general anesthesia without difficulty. Patient was placed into prone positioning.  The perianal region was prepped and draped in sterile fashion. Surgical time-out confirmed our plan.  I did digital rectal examination and then transitioned over to anoscopy to get a sense of the anatomy.  Findings noted above.   I excised the right anterior & posterior external hemorrhoids longitudinally, sparing anoderm to avoid stricturing.   I used a 2-0 Vicryl suture on a UR-6 needle in a figure-of-eight fashion 6 cm proximal to the anal verge with a large anoscope in place. I then ran that stitch longitudinally more distally to close the rectal wound. I then tied that stitch down to cause a hemorrhoidopexy. I then closed out to the perianal skin longitudinally with interrupted stitches, leaving the last 5 mm open to allow drainage.  I did that for the right anterior & posterior external  hemorrhoid wounds.  The patient had a persistent anterior midline raphae external hemorrhoid.  I  excised that.  I left the wound open to avoid    At completion of this, external anatomy looked much more normal.  Mild redundancy in the posterior midline, but I left that alone to avoid stricture  I repeated anoscopy and examination.  Hemostasis was good.  Patient is being extubated go to go to the recovery room.  I had discussed postop care in detail with the patient in the preop holding area.  Instructions for post-operative recovery and prescriptions are written.  I am about to discuss the patient's status to the family.

## 2013-01-25 NOTE — Anesthesia Postprocedure Evaluation (Signed)
Anesthesia Post Note  Patient: Anna Mckinney  Procedure(s) Performed: Procedure(s) (LRB): EXAM UNDER ANESTHESIA WITH HEMORRHOIDECTOMY (N/A)  Anesthesia type: General  Patient location: PACU  Post pain: Pain level controlled  Post assessment: Post-op Vital signs reviewed  Last Vitals:  Filed Vitals:   01/25/13 1531  BP: 149/83  Pulse: 74  Temp: 36.4 C  Resp: 12    Post vital signs: Reviewed  Level of consciousness: sedated  Complications: No apparent anesthesia complications

## 2013-01-25 NOTE — Anesthesia Preprocedure Evaluation (Addendum)
Anesthesia Evaluation  Patient identified by MRN, date of birth, ID band Patient awake    Reviewed: Allergy & Precautions, H&P , NPO status , Patient's Chart, lab work & pertinent test results  History of Anesthesia Complications (+) PONV and history of anesthetic complications  Airway Mallampati: II TM Distance: >3 FB Neck ROM: Full    Dental  (+) Teeth Intact and Dental Advisory Given   Pulmonary neg pulmonary ROS, former smoker,  breath sounds clear to auscultation  Pulmonary exam normal       Cardiovascular + Peripheral Vascular Disease negative cardio ROS  Rhythm:Regular Rate:Normal     Neuro/Psych negative neurological ROS  negative psych ROS   GI/Hepatic negative GI ROS, Neg liver ROS, GERD-  ,  Endo/Other  negative endocrine ROS  Renal/GU negative Renal ROS  negative genitourinary   Musculoskeletal negative musculoskeletal ROS (+)   Abdominal   Peds  Hematology negative hematology ROS (+)   Anesthesia Other Findings   Reproductive/Obstetrics                          Anesthesia Physical Anesthesia Plan  ASA: II  Anesthesia Plan: General   Post-op Pain Management:    Induction: Intravenous  Airway Management Planned: Oral ETT  Additional Equipment:   Intra-op Plan:   Post-operative Plan: Extubation in OR  Informed Consent: I have reviewed the patients History and Physical, chart, labs and discussed the procedure including the risks, benefits and alternatives for the proposed anesthesia with the patient or authorized representative who has indicated his/her understanding and acceptance.   Dental advisory given  Plan Discussed with: CRNA  Anesthesia Plan Comments:         Anesthesia Quick Evaluation

## 2013-01-25 NOTE — H&P (Signed)
Anna Mckinney  [birth year] [id number]  CARE TEAM:  PCP: Carollee Herter, MD  Outpatient Care Team: Patient Care Team: Ronnald Nian, MD as PCP - General (Family Medicine) Joselyn Arrow, MD as Attending Physician (Family Medicine) Griffith Citron, MD as Consulting Physician (Gastroenterology)  Inpatient Treatment Team: Treatment Team: Attending Provider: Ardeth Sportsman, MD  Procedure (Date: 10/20/2012):   THD hemorrhoidal ligation/pexy. External hemorrhoidectomy x1   This patient returns for surgical evaluation. She is doing well. Soreness is less.  Using fiber supp with better BMs more soft.  No fevers or chills.  Ext hemorrhoids still bother her.   Past Medical History  Diagnosis Date  . Barrett's esophagus     last EGD 2011 in [facility]  . Osteopenia   . Internal prolapsed hemorrhoids s/p THD hemorrhoidal ligation/pexy 10/16/2012  . PONV (postoperative nausea and vomiting)     PT'S HEART RATE DOWN TO 40 WITH HER LAST SURGERY - SHE REMEMBERS BEING ASKED TO WAKE UP SO HEART RATE WOULD GO UP - SCARED HER.  [name] GERD (gastroesophageal reflux disease)     Past Surgical History  Procedure Laterality Date  . Cholecystectomy  2010  . Abdominal hysterectomy  1984    1 ovary remains; removed for "cancer cells"  . Transanal hemorrhoidal dearterialization  10/20/2012    hemorrhoidal ligation and pexy.  [name] Posterolateral external hemorrhoidactomy  10/20/2012    left    History   Social History  . Marital Status: Married    Spouse Name: N/A    Number of Children: 2  . Years of Education: N/A   Occupational History  . 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 Smoking status: Former Smoker -- 0.30 packs/day for 20 years    Types: Cigarettes    Quit date: 01/24/2009  . Smokeless tobacco: Never Used  . Alcohol Use: No  . Drug Use: No  . Sexual Activity: Not on file   Other Topics Concern  . Not on file   Social History Narrative  . No narrative on  file    Family History  Problem Relation Age of Onset  . Cancer Mother 23    breast cancer  . COPD Father   . Heart disease Father     CHF  . Atrial fibrillation Father   . Angelman syndrome Daughter   . Seizures Daughter   . Cancer Paternal Grandmother     stomach  . Thyroid disease Sister     Current Facility-Administered Medications  Medication Dose Route Frequency Provider Last Rate Last Dose  . 0.9 % irrigation (POUR BTL)    PRN Ardeth Sportsman, MD   1,000 mL at 01/25/13 1239  . bupivacaine liposome (EXPAREL) 1.3 % injection 266 mg  20 mL Infiltration Once Ardeth Sportsman, MD         Allergies  Allergen Reactions  . Codeine   . Vicodin [Hydrocodone-Acetaminophen] Itching and Nausea And Vomiting    ROS: Constitutional:  No fevers, chills, sweats.  Weight stable Eyes:  No vision changes, No discharge HENT:  No sore throats, nasal drainage Lymph: No neck swelling, No bruising easily Pulmonary:  No cough, productive sputum CV: No orthopnea, PND  Patient walks 30 minutes for about 1 miles without difficulty.  No exertional chest/neck/shoulder/arm pain. GI: No personal nor family history of GI/colon cancer, inflammatory bowel disease, irritable bowel syndrome, allergy such as Celiac Sprue, dietary/dairy problems, colitis, ulcers nor gastritis.  No recent  sick contacts/gastroenteritis.  No travel outside the country.  No changes in diet. Renal: No UTIs, No hematuria Genital:  No drainage, bleeding, masses Musculoskeletal: No severe joint pain.  Good ROM major joints Skin:  No sores or lesions.  No rashes Heme/Lymph:  No easy bleeding.  No swollen lymph nodes Neuro: No focal weakness/numbness.  No seizures Psych: No suicidal ideation.  No hallucinations  BP 126/70  Pulse 62  Temp(Src) 98.3 F (36.8 C) (Oral)  Resp 16  SpO2 99%  Physical Exam: General: Pt awake/alert/oriented x4 in no major acute distress Eyes: PERRL, normal EOM. Sclera nonicteric Neuro: CN II-XII  intact w/o focal sensory/motor deficits. Lymph: No head/neck/groin lymphadenopathy Psych:  No delerium/psychosis/paranoia HENT: Normocephalic, Mucus membranes moist.  No thrush Neck: Supple, No tracheal deviation Chest: No pain.  Good respiratory excursion. CV:  Pulses intact.  Regular rhythm Abdomen: Soft, Nondistended.  Nontender.  No incarcerated hernias. Ext:  SCDs BLE.  No significant edema.  No cyanosis Skin: No petechiae / purpurea.  No major sores Musculoskeletal: No severe joint pain.  Good ROM major joints   Results:   Labs: No results found for this or any previous visit (from the past 48 hour(s)).  Imaging / Studies: No results found.  Medications / Allergies: per chart  Antibiotics: Anti-infectives   None      Assessment  Anna Mckinney  62 y.o. female  Day of Surgery  Procedure(s): EXAM UNDER ANESTHESIA WITH HEMORRHOIDECTOMY  Problem List:  Active Problems:   * No active hospital problems. *   Persistent Sx ext hemorrhoids  Plan:  EUA with excision:  The anatomy & physiology of the anorectal region was discussed.  The pathophysiology of hemorrhoids and differential diagnosis was discussed.  Natural history risks without surgery was discussed.   I stressed the importance of a bowel regimen to have daily soft bowel movements to minimize progression of disease.  Interventions such as sclerotherapy & banding were discussed.  The patient's symptoms are not adequately controlled by medicines and other non-operative treatments.  I feel the risks & problems of no surgery outweigh the operative risks; therefore, I recommended surgery to treat the hemorrhoids by ligation, pexy, and possible resection.  Risks such as bleeding, infection, urinary difficulties, need for further treatment, heart attack, death, and other risks were discussed.   I noted a good likelihood this will help address the problem.  Goals of post-operative recovery were discussed as well.   Possibility that this will not correct all symptoms was explained.  Post-operative pain, bleeding, constipation, and other problems after surgery were discussed.  We will work to minimize complications.   Educational handouts further explaining the pathology, treatment options, and bowel regimen were given as well.  Questions were answered.  The patient expresses understanding & wishes to proceed with surgery.        Ardeth Sportsman, M.D., F.A.C.S. Gastrointestinal and Minimally Invasive Surgery [facility] [address], [address] [place], [place] [address] [phone] Main / Paging   01/25/2013

## 2013-01-25 NOTE — Progress Notes (Signed)
Patient verbalized understanding of all discharge instructions. Patient was given gauze and normal saline bottle for dressing changes at home. Patient/Sister was given prescription and d/c forms. Patient was accompanied by nursing staff to lobby. Patient's sister is waiting on patient in lobby. Patient is stable at discharge.

## 2013-01-25 NOTE — Preoperative (Signed)
Beta Blockers   Reason not to administer Beta Blockers:Not Applicable 

## 2013-01-26 ENCOUNTER — Encounter (HOSPITAL_COMMUNITY): Payer: Self-pay | Admitting: Surgery

## 2013-02-11 IMAGING — MG MM DIGITAL SCREENING BILAT
4 series · 4 of 4 positions shown · non-contrast
Comparison: Previous exams

CLINICAL DATA: Screening.

MAMMOGRAPHIC BILATERAL DIGITAL SCREENING WITH CAD

[R CC]
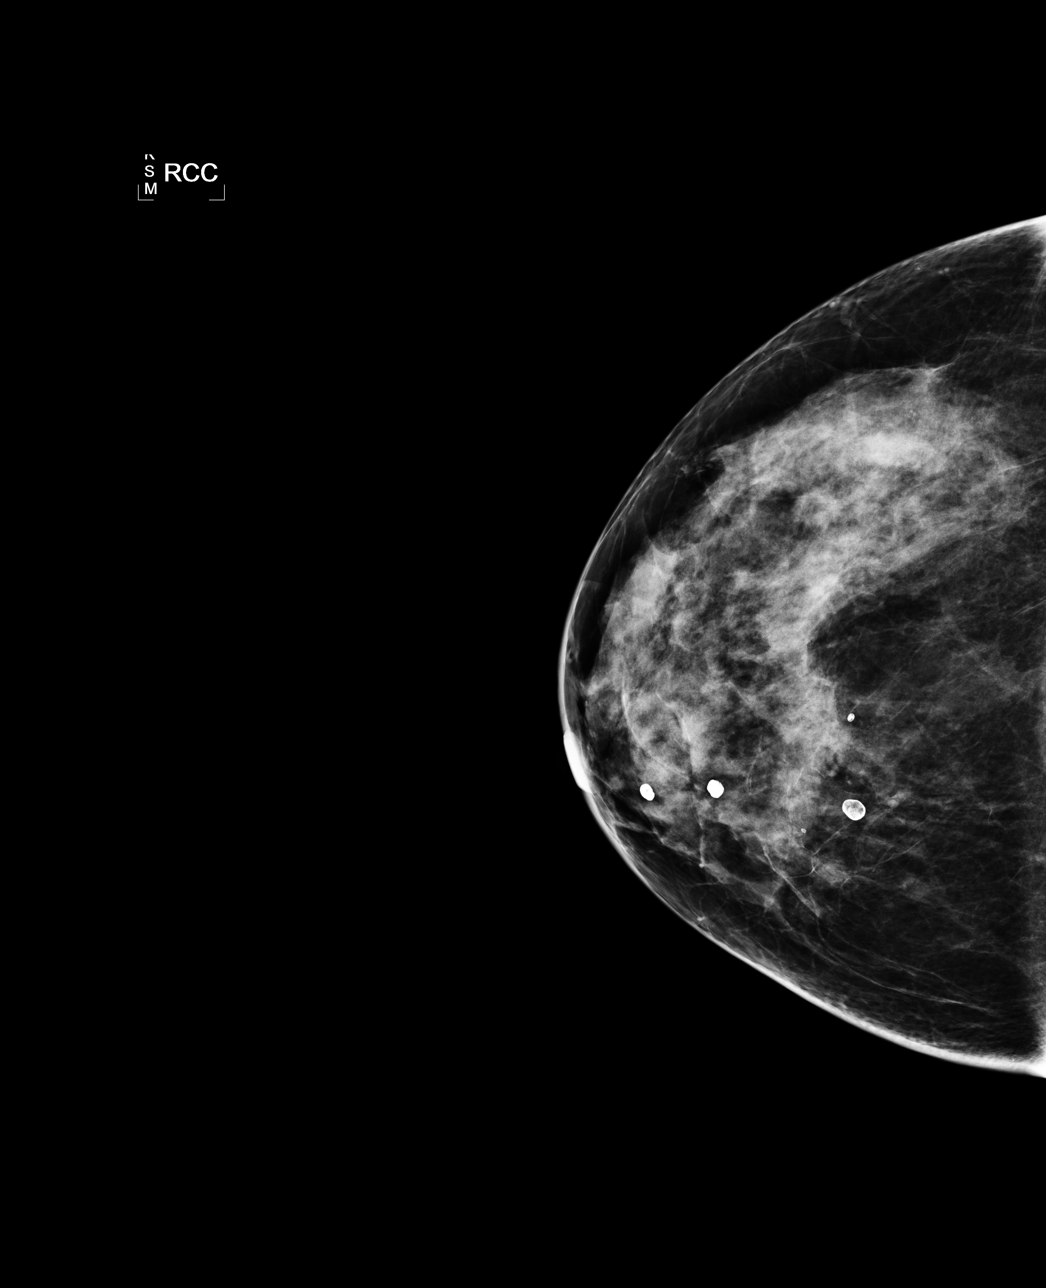

[L CC]
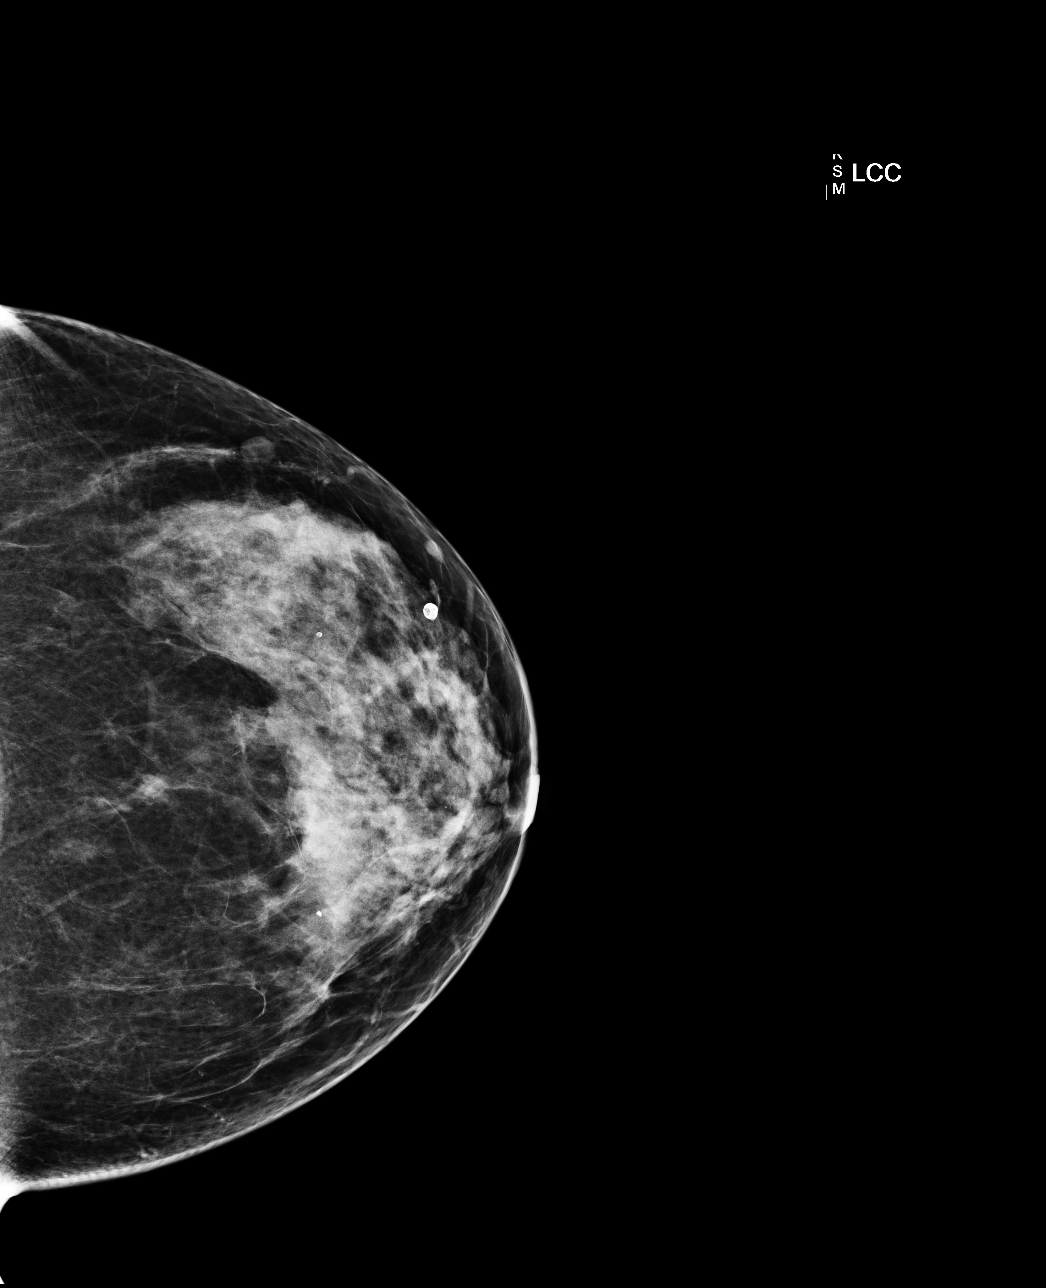

[L MLO]
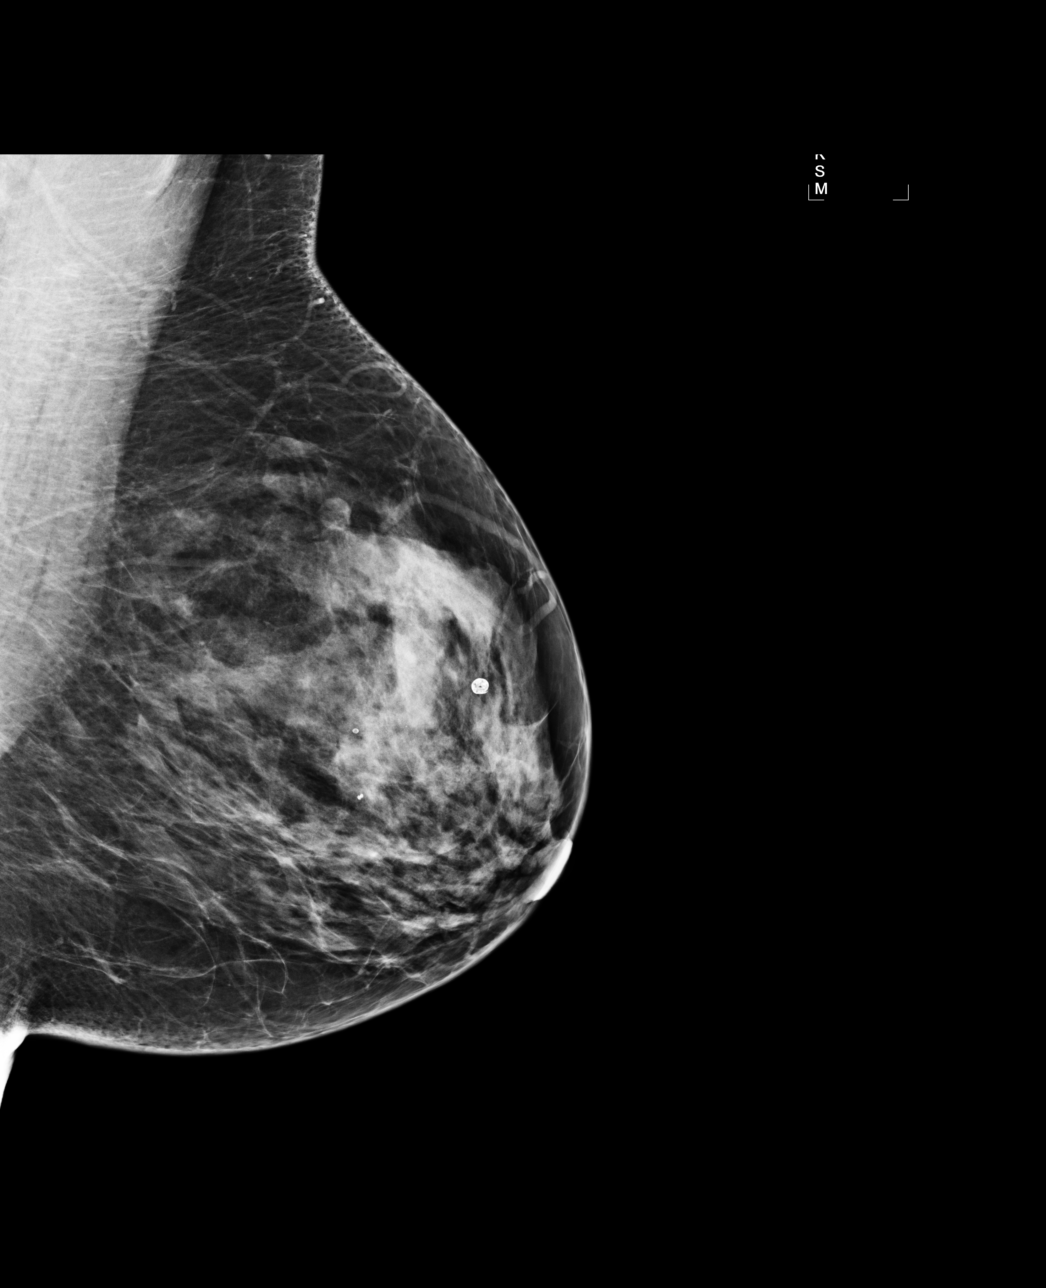

[R MLO]
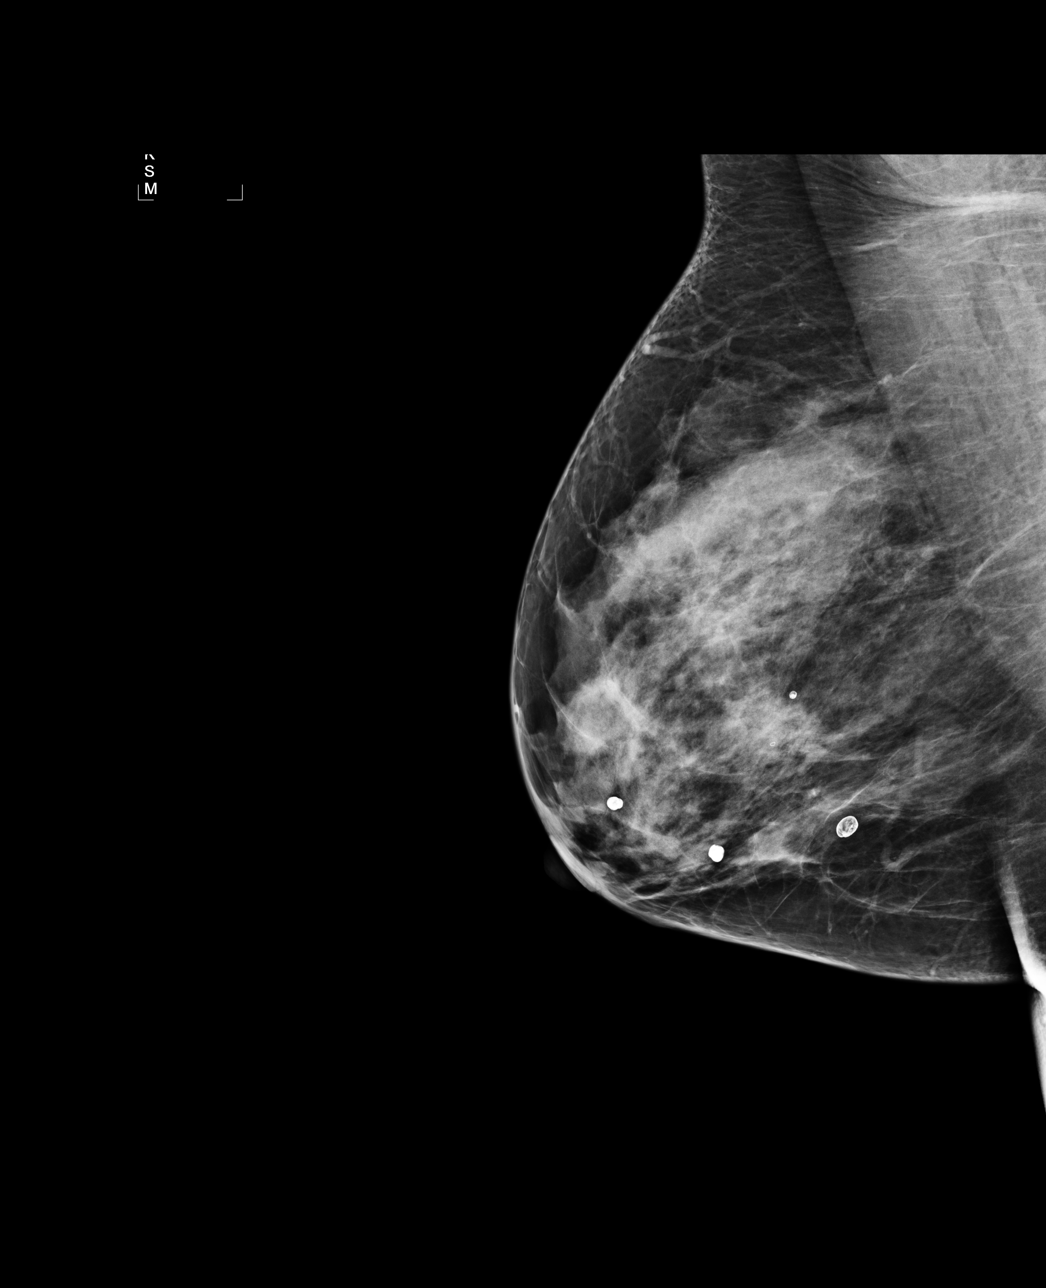

[4 of 4 positions shown; findings below may reference images not displayed]

FINDINGS: The breast tissue is heterogeneously dense. No
suspicious masses, architectural distortion, or suspicious
calcifications are present.

Images were processed with CAD.
IMPRESSION: No specific mammographic evidence of malignancy.

A result letter of this screening mammogram will be mailed directly
to the patient.

RECOMMENDATION:
Screening mammogram in one year. (Code:NJ-Z-[id number])

BI-RADS CATEGORY 1:  Negative

## 2013-02-19 ENCOUNTER — Ambulatory Visit (INDEPENDENT_AMBULATORY_CARE_PROVIDER_SITE_OTHER): Payer: BC Managed Care – PPO | Admitting: Surgery

## 2013-02-19 ENCOUNTER — Encounter (INDEPENDENT_AMBULATORY_CARE_PROVIDER_SITE_OTHER): Payer: Self-pay | Admitting: Surgery

## 2013-02-19 ENCOUNTER — Encounter (INDEPENDENT_AMBULATORY_CARE_PROVIDER_SITE_OTHER): Payer: BC Managed Care – PPO | Admitting: Surgery

## 2013-02-19 VITALS — BP 122/82 | HR 60 | Temp 97.8°F | Resp 18 | Ht 64.5 in | Wt 142.0 lb

## 2013-02-19 DIAGNOSIS — K648 Other hemorrhoids: Secondary | ICD-10-CM

## 2013-02-19 DIAGNOSIS — K644 Residual hemorrhoidal skin tags: Secondary | ICD-10-CM

## 2013-02-19 NOTE — Progress Notes (Signed)
Subjective:     Patient ID: Anna Mckinney, female   DOB: [birth year], 62 y.o.   MRN: [id number]  HPI  Anna Mckinney  03/07/1951 [id number]  Patient Care Team: Ronnald Nian, MD as PCP - General (Family Medicine) Joselyn Arrow, MD as Attending Physician (Family Medicine) Griffith Citron, MD as Consulting Physician (Gastroenterology)  Procedure (Date: 01/25/2013):  POST-OPERATIVE DIAGNOSIS: Symptomatic external hemorrhoids   PROCEDURE: Procedure(s):  Examination under anesthesia  Removal of int/external hemorrhoids (R anterior & posterior)   SURGEON: Surgeon(s):  Ardeth Sportsman, MD  This patient returns for surgical re-evaluation.  Feels like she tolerated the second surgery much better.  Off narcotics.  Minimal bleeding.  Minimal discomfort.  In good spirits.  Having regular daily bowel movements  Patient Active Problem List   Diagnosis Date Noted  . Insomnia 12/25/2012  . External hemorrhoids s/p ext hemorrhoidectomy x2 01/25/2013 11/07/2012  . Internal prolapsed hemorrhoids s/p THD hemorrhoidal ligation/pexy 10/20/2012 10/16/2012  . Dyslipidemia 09/26/2012  . GERD (gastroesophageal reflux disease) 07/24/2012  . Barrett's esophagus 07/24/2012  . Cough 12/22/2011  . Osteopenia 07/27/2010  . Pure hypercholesterolemia 07/27/2010    Past Medical History  Diagnosis Date  . Barrett's esophagus     last EGD 2011 in [facility]  . Osteopenia   . Internal prolapsed hemorrhoids s/p THD hemorrhoidal ligation/pexy 10/16/2012  . PONV (postoperative nausea and vomiting)     PT'S HEART RATE DOWN TO 40 WITH HER LAST SURGERY - SHE REMEMBERS BEING ASKED TO WAKE UP SO HEART RATE WOULD GO UP - SCARED HER.  [name] GERD (gastroesophageal reflux disease)     Past Surgical History  Procedure Laterality Date  . Cholecystectomy  2010  . Abdominal hysterectomy  1984    1 ovary remains; removed for "cancer cells"  . Transanal hemorrhoidal dearterialization  10/20/2012    hemorrhoidal ligation and  pexy.  [name] Posterolateral external hemorrhoidactomy  10/20/2012    left  . Evaluation under anesthesia with hemorrhoidectomy N/A 01/25/2013    Procedure: EXAM UNDER ANESTHESIA WITH HEMORRHOIDECTOMY;  Surgeon: Ardeth Sportsman, MD;  Location: [facility];  Service: General;  Laterality: N/A;    History   Social History  . Marital Status: Married    Spouse Name: N/A    Number of Children: 2  . Years of Education: N/A   Occupational History  . 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 Smoking status: Former Smoker -- 0.30 packs/day for 20 years    Types: Cigarettes    Quit date: 01/24/2009  . Smokeless tobacco: Never Used  . Alcohol Use: No  . Drug Use: No  . Sexual Activity: Not on file   Other Topics Concern  . Not on file   Social History Narrative  . No narrative on file    Family History  Problem Relation Age of Onset  . Cancer Mother 66    breast cancer  . COPD Father   . Heart disease Father     CHF  . Atrial fibrillation Father   . Angelman syndrome Daughter   . Seizures Daughter   . Cancer Paternal Grandmother     stomach  . Thyroid disease Sister     Current Outpatient Prescriptions  Medication Sig Dispense Refill  . acidophilus (RISAQUAD) CAPS capsule Take 1 capsule by mouth daily.      . Cholecalciferol (VITAMIN D3) 5000 UNITS TABS Take 1 tablet by mouth daily.      [name]  esomeprazole (NEXIUM) 40 MG capsule Take 40 mg by mouth. IN AM      . psyllium (HYDROCIL/METAMUCIL) 95 % PACK Take 1 packet by mouth daily.      . Psyllium (METAMUCIL PO) Take by mouth. ONE SOOP IN WATER OR OTHER LIQUID DAILY      . vitamin C (ASCORBIC ACID) 500 MG tablet Take 500 mg by mouth 2 (two) times daily.      [name] zolpidem (AMBIEN CR) 12.5 MG CR tablet take 1 tablet by mouth once daily  30 tablet  2   No current facility-administered medications for this visit.     Allergies  Allergen Reactions  . Hydrocodone Itching and Nausea And Vomiting  . Codeine     BP  122/82  Pulse 60  Temp(Src) 97.8 F (36.6 C) (Temporal)  Resp 18  Ht 5' 4.5" (1.638 m)  Wt 142 lb (64.411 kg)  BMI 24.01 kg/m2  No results found.   Review of Systems  Constitutional: Negative for fever, chills and diaphoresis.  HENT: Negative for ear pain, sore throat and trouble swallowing.   Eyes: Negative for photophobia and visual disturbance.  Respiratory: Negative for cough and choking.   Cardiovascular: Negative for chest pain and palpitations.  Gastrointestinal: Positive for anal bleeding. Negative for nausea, vomiting, abdominal pain, diarrhea, constipation, blood in stool and rectal pain.  Genitourinary: Negative for dysuria, frequency and difficulty urinating.  Musculoskeletal: Negative for gait problem and myalgias.  Skin: Negative for color change, pallor and rash.  Neurological: Negative for dizziness, speech difficulty, weakness and numbness.  Hematological: Negative for adenopathy.  Psychiatric/Behavioral: Negative for confusion and agitation. The patient is not nervous/anxious.        Objective:   Physical Exam  Constitutional: She is oriented to person, place, and time. She appears well-developed and well-nourished. No distress.  HENT:  Head: Normocephalic.  Mouth/Throat: Oropharynx is clear and moist. No oropharyngeal exudate.  Eyes: Conjunctivae and EOM are normal. Pupils are equal, round, and reactive to light. No scleral icterus.  Neck: Normal range of motion. No tracheal deviation present.  Cardiovascular: Normal rate and intact distal pulses.   Pulmonary/Chest: Effort normal. No respiratory distress. She exhibits no tenderness.  Abdominal: Soft. She exhibits no distension. There is no tenderness. Hernia confirmed negative in the right inguinal area and confirmed negative in the left inguinal area.  Genitourinary:    No vaginal discharge found.  Musculoskeletal: Normal range of motion. She exhibits no tenderness.  Lymphadenopathy:       Right: No  inguinal adenopathy present.       Left: No inguinal adenopathy present.  Neurological: She is alert and oriented to person, place, and time. No cranial nerve deficit. She exhibits normal muscle tone. Coordination normal.  Skin: Skin is warm and dry. No rash noted. She is not diaphoretic.  Psychiatric: She has a normal mood and affect. Her behavior is normal.       Assessment:     Status post excision of external hemorrhoids with history of THD ligation for large internal hemorrhoids.  Recovering well.     Plan:     Increase activity as tolerated to regular activity.  Low impact exercise such as walking an hour a day at least ideal.  Do not push through pain.  Diet as tolerated.  Low fat high fiber diet ideal.  Bowel regimen with 30 g fiber a day and fiber supplement as needed to avoid problems.  The anatomy & physiology of the anorectal  region was discussed.  The pathophysiology of hemorrhoids and differential diagnosis was discussed.  Natural history progression  was discussed.   I stressed the importance of a bowel regimen to have daily soft bowel movements to minimize progression of disease.   Goal of one BM / day ideal.  Use of wet wipes, warm baths, avoiding straining, etc were emphasized.  Educational handouts further explaining the pathology, treatment options, and bowel regimen were given as well.   The patient expressed understanding.  Return to clinic 3-4weeks.  If better, can be RTC as needed.   Instructions discussed.  Followup with primary care physician for other health issues as would normally be done.  Questions answered.  The patient expressed understanding and appreciation

## 2013-02-19 NOTE — Patient Instructions (Signed)
HEMORRHOIDS  The rectum is the last foot of your colon, and it naturally stretches to hold stool.  Hemorrhoidal piles are natural clusters of blood vessels that help the rectum and anal canal stretch to hold stool and allow bowel movements to eliminate feces.   Hemorrhoids are abnormally swollen blood vessels in the rectum.  Too much pressure in the rectum causes hemorrhoids by forcing blood to stretch and bulge the walls of the veins, sometimes even rupturing them.  Hemorrhoids can become like varicose veins you might see on a person's legs.  Most people will develop a flare of hemorrhoids in their lifetime.  When bulging hemorrhoidal veins are irritated, they can swell, burn, itch, cause pain, and bleed.  Most flares will calm down gradually own within a few weeks.  However, once hemorrhoids are created, they are difficult to get rid of completely and tend to flare more easily than the first flare.   Fortunately, good habits and simple medical treatment usually control hemorrhoids well, and surgery is needed only in severe cases. Types of Hemorrhoids:  Internal hemorrhoids usually don't initially hurt or itch; they are deep inside the rectum and usually have no sensation. If they begin to push out (prolapse), pain and burning can occur.  However, internal hemorrhoids can bleed.  Anal bleeding should not be ignored since bleeding could come from a dangerous source like colorectal cancer, so persistent rectal bleeding should be investigated by a doctor, sometimes with a colonoscopy.  External hemorrhoids cause most of the symptoms - pain, burning, and itching. Nonirritated hemorrhoids can look like small skin tags coming out of the anus.   Thrombosed hemorrhoids can form when a hemorrhoid blood vessel bursts and causes the hemorrhoid to suddenly swell.  A purple blood clot can form in it and become an excruciatingly painful lump at the anus. Because of these unpleasant symptoms, immediate incision and  drainage by a surgeon at an office visit can provide much relief of the pain.    PREVENTION Avoiding the most frequent causes listed below will prevent most cases of hemorrhoids: Constipation Hard stools Diarrhea  Constant sitting  Straining with bowel movements Sitting on the toilet for a long time  Severe coughing  episodes Pregnancy / Childbirth  Heavy Lifting  Sometimes avoiding the above triggers is difficult:  How can you avoid sitting all day if you have a seated job? Also, we try to avoid coughing and diarrhea, but sometimes it's beyond your control.  Still, there are some practical hints to help: Keep the anal and genital area clean.  Moistened tissues such as flushable wet wipes are less irritating than toilet paper.  Using irrigating showers or bottle irrigation washing gently cleans this sensitive area.   Avoid dry toilet paper when cleaning after bowel movements.  . Keep the anal and genital area dry.  Lightly pat the rectal area dry.  Avoid rubbing.  Talcum or baby powders can help GET YOUR STOOLS SOFT.   This is the most important way to prevent irritated hemorrhoids.  Hard stools are like sandpaper to the anorectal canal and will cause more problems.  The goal: ONE SOFT BOWEL MOVEMENT A DAY!  BMs from every other day to 3 times a day is a tolerable range Treat coughing, diarrhea and constipation early since irritated hemorrhoids may soon follow.  If your main job activity is seated, always stand or walk during your breaks. Make it a point to stand and walk at least 5 minutes every hour   and try to shift frequently in your chair to avoid direct rectal pressure.  Always exhale as you strain or lift. Don't hold your breath.  Do not delay or try to prevent a bowel movement when the urge is present. Exercise regularly (walking or jogging 60 minutes a day) to stimulate the bowels to move. No reading or other activity while on the toilet. If bowel movements take longer than 5 minutes,  you are too constipated. AVOID CONSTIPATION Drink plenty of liquids (1 1/2 to 2 quarts of water and other fluids a day unless fluid restricted for another medical condition). Liquids that contain caffeine (coffee a, tea, soft drinks) can be dehydrating and should be avoided until constipation is controlled. Consider minimizing milk, as dairy products may be constipating. Eat plenty of fiber (30g a day ideal, more if needed).  Fiber is the undigested part of plant food that passes into the colon, acting as "natures broom" to encourage bowel motility and movement.  Fiber can absorb and hold large amounts of water. This results in a larger, bulkier stool, which is soft and easier to pass.  Eating foods high in fiber - 12 servings - such as  Vegetables: Root (potatoes, carrots, turnips), Leafy green (lettuce, salad greens, celery, spinach), High residue (cabbage, broccoli, etc.) Fruit: Fresh, Dried (prunes, apricots, cherries), Stewed (applesauce)  Whole grain breads, pasta, whole wheat Bran cereals, muffins, etc. Consider adding supplemental bulking fiber which retains large volumes of water: Psyllium ground seeds (native plant from [place])--available as Metamucil, Konsyl, Effersyllium, Per Diem Fiber, or the less expensive generic forms.  Citrucel  (methylcellulose wood fiber) . FiberCon (Polycarbophil) Polyethylene Glycol - and "artificial" fiber commonly called Miralax or Glycolax.  It is helpful for people with gassy or bloated feelings with regular fiber Flax Seed - a less gassy natural fiber  Laxatives can be useful for a short period if constipation is severe Osmotics (Milk of Magnesia, Fleets Phospho-Soda, Magnesium Citrate)  Stimulants (Senokot,   Castor Oil,  Dulcolax, Ex-Lax)    Laxatives are not a good long-term solution as it can stress the bowels and cause too much mineral loss and dehydration.   Avoid taking laxatives for more than 7 days in a row.  AVOID DIARRHEA Switch to  liquids and simpler foods for a few days to avoid stressing your intestines further. Avoid dairy products (especially milk & ice cream) for a short time.  The intestines often can lose the ability to digest lactose when stressed. Avoid foods that cause gassiness or bloating.  Typical foods include beans and other legumes, cabbage, broccoli, and dairy foods.  Every person has some sensitivity to other foods, so listen to your body and avoid those foods that trigger problems for you. Adding fiber (Citrucel, Metamucil, FiberCon, Flax seed, Miralax) gradually can help thicken stools by absorbing excess fluid and retrain the intestines to act more normally.  Slowly increase the dose over a few weeks.  Too much fiber too soon can backfire and cause cramping & bloating. Probiotics (such as active yogurt, Align, etc) may help repopulate the intestines and colon with normal bacteria and calm down a sensitive digestive tract.  Most studies show it to be of mild help, though, and such products can be costly. Medicines: Bismuth subsalicylate (ex. Kayopectate, Pepto Bismol) every 30 minutes for up to 6 doses can help control diarrhea.  Avoid if pregnant. Loperamide (Immodium) can slow down diarrhea.  Start with two tablets (4mg total) first and then try one tablet   every 6 hours.  Avoid if you are having fevers or severe pain.  If you are not better or start feeling worse, stop all medicines and call your doctor for advice Call your doctor if you are getting worse or not better.  Sometimes further testing (cultures, endoscopy, X-ray studies, bloodwork, etc) may be needed to help diagnose and treat the cause of the diarrhea. TREATMENT OF HEMORRHOID FLARE If these preventive measures fail, you must take action right away! Hemorrhoids are one condition that can be mild in the morning and become intolerable by nightfall. Most hemorrhoidal flares take several weeks to calm down.  These suggestions can help: Warm soaks.   This helps more than any topical medication.  Use up to 8 times a day.  Usually sitz baths or sitting in a warm bathtub helps.  Sitting on moist warm towels are helpful.  Switching to ice packs/cool compresses can be helpful  Use a Sitz Bath 4-8 times a day for relief A sitz bath is a warm water bath taken in the sitting position that covers only the hips and buttocks. It may be used for either healing or hygiene purposes. Sitz baths are also used to relieve pain, itching, or muscle spasms. The water may contain medicine. Moist heat will help you heal and relax.  HOME CARE INSTRUCTIONS  Take 3 to 4 sitz baths a day. 1. Fill the bathtub half full with warm water. 2. Sit in the water and open the drain a little. 3. Turn on the warm water to keep the tub half full. Keep the water running constantly. 4. Soak in the water for 15 to 20 minutes. 5. After the sitz bath, pat the affected area dry first. SEEK MEDICAL CARE IF:  You get worse instead of better. Stop the sitz baths if you get worse.  Normalize your bowels.  Extremes of diarrhea or constipation will make hemorrhoids worse.  One soft bowel movement a day is the goal.  Fiber can help get your bowels regular Wet wipes instead of toilet paper Pain control with a NSAID such as ibuprofen (Advil) or naproxen (Aleve) or acetaminophen (Tylenol) around the clock.  Narcotics are constipating and should be minimized if possible Topical creams contain steroids (bydrocortisone) or local anesthetic (xylocaine) can help make pain and itching more tolerable.   EVALUATION If hemorrhoids are still causing problems, you could benefit by an evaluation by a surgeon.  The surgeon will obtain a history and examine you.  If hemorrhoids are diagnosed, some therapies can be offered in the office, usually with an anoscope into the less sensitive area of the rectum: -injection of hemorrhoids (sclerotherapy) can scar the blood vessels of the swollen/enlarged hemorrhoids to  help shrink them down to a more normal size -rubber banding of the enlarged hemorrhoids to help shrink them down to a more normal size -drainage of the blood clot causing a thrombosed hemorrhoid,  to relieve the severe pain   While 90% of the time such problems from hemorrhoids can be managed without preceding to surgery, sometimes the hemorrhoids require a operation to control the problem (uncontrolled bleeding, prolapse, pain, etc.).   This involves being placed under general anesthesia where the surgeon can confirm the diagnosis and remove, suture, or staple the hemorrhoid(s).  Your surgeon can help you treat the problem appropriately.    GETTING TO GOOD BOWEL HEALTH. Irregular bowel habits such as constipation and diarrhea can lead to many problems over time.  Having one soft bowel movement   a day is the most important way to prevent further problems.  The anorectal canal is designed to handle stretching and feces to safely manage our ability to get rid of solid waste (feces, poop, stool) out of our body.  BUT, hard constipated stools can act like ripping concrete bricks and diarrhea can be a burning fire to this very sensitive area of our body, causing inflamed hemorrhoids, anal fissures, increasing risk is perirectal abscesses, abdominal pain/bloating, an making irritable bowel worse.     The goal: ONE SOFT BOWEL MOVEMENT A DAY!  To have soft, regular bowel movements:    Drink at least 8 tall glasses of water a day.     Take plenty of fiber.  Fiber is the undigested part of plant food that passes into the colon, acting s "natures broom" to encourage bowel motility and movement.  Fiber can absorb and hold large amounts of water. This results in a larger, bulkier stool, which is soft and easier to pass. Work gradually over several weeks up to 6 servings a day of fiber (25g a day even more if needed) in the form of: o Vegetables -- Root (potatoes, carrots, turnips), leafy green (lettuce, salad greens,  celery, spinach), or cooked high residue (cabbage, broccoli, etc) o Fruit -- Fresh (unpeeled skin & pulp), Dried (prunes, apricots, cherries, etc ),  or stewed ( applesauce)  o Whole grain breads, pasta, etc (whole wheat)  o Bran cereals    Bulking Agents -- This type of water-retaining fiber generally is easily obtained each day by one of the following:  o Psyllium bran -- The psyllium plant is remarkable because its ground seeds can retain so much water. This product is available as Metamucil, Konsyl, Effersyllium, Per Diem Fiber, or the less expensive generic preparation in drug and health food stores. Although labeled a laxative, it really is not a laxative.  o Methylcellulose -- This is another fiber derived from wood which also retains water. It is available as Citrucel. o Polyethylene Glycol - and "artificial" fiber commonly called Miralax or Glycolax.  It is helpful for people with gassy or bloated feelings with regular fiber o Flax Seed - a less gassy fiber than psyllium   No reading or other relaxing activity while on the toilet. If bowel movements take longer than 5 minutes, you are too constipated   AVOID CONSTIPATION.  High fiber and water intake usually takes care of this.  Sometimes a laxative is needed to stimulate more frequent bowel movements, but    Laxatives are not a good long-term solution as it can wear the colon out. o Osmotics (Milk of Magnesia, Fleets phosphosoda, Magnesium citrate, MiraLax, GoLytely) are safer than  o Stimulants (Senokot, Castor Oil, Dulcolax, Ex Lax)    o Do not take laxatives for more than 7days in a row.    IF SEVERELY CONSTIPATED, try a Bowel Retraining Program: o Do not use laxatives.  o Eat a diet high in roughage, such as bran cereals and leafy vegetables.  o Drink six (6) ounces of prune or apricot juice each morning.  o Eat two (2) large servings of stewed fruit each day.  o Take one (1) heaping tablespoon of a psyllium-based bulking agent  twice a day. Use sugar-free sweetener when possible to avoid excessive calories.  o Eat a normal breakfast.  o Set aside 15 minutes after breakfast to sit on the toilet, but do not strain to have a bowel movement.  o If you do   not have a bowel movement by the third day, use an enema and repeat the above steps.    Controlling diarrhea o Switch to liquids and simpler foods for a few days to avoid stressing your intestines further. o Avoid dairy products (especially milk & ice cream) for a short time.  The intestines often can lose the ability to digest lactose when stressed. o Avoid foods that cause gassiness or bloating.  Typical foods include beans and other legumes, cabbage, broccoli, and dairy foods.  Every person has some sensitivity to other foods, so listen to our body and avoid those foods that trigger problems for you. o Adding fiber (Citrucel, Metamucil, psyllium, Miralax) gradually can help thicken stools by absorbing excess fluid and retrain the intestines to act more normally.  Slowly increase the dose over a few weeks.  Too much fiber too soon can backfire and cause cramping & bloating. o Probiotics (such as active yogurt, Align, etc) may help repopulate the intestines and colon with normal bacteria and calm down a sensitive digestive tract.  Most studies show it to be of mild help, though, and such products can be costly. o Medicines:   Bismuth subsalicylate (ex. Kayopectate, Pepto Bismol) every 30 minutes for up to 6 doses can help control diarrhea.  Avoid if pregnant.   Loperamide (Immodium) can slow down diarrhea.  Start with two tablets (4mg total) first and then try one tablet every 6 hours.  Avoid if you are having fevers or severe pain.  If you are not better or start feeling worse, stop all medicines and call your doctor for advice o Call your doctor if you are getting worse or not better.  Sometimes further testing (cultures, endoscopy, X-ray studies, bloodwork, etc) may be needed  to help diagnose and treat the cause of the diarrhea.  ANORECTAL SURGERY: POST OP INSTRUCTIONS  1. Take your usually prescribed home medications unless otherwise directed. 2. DIET: Follow a light bland diet the first 24 hours after arrival home, such as soup, liquids, crackers, etc.  Be sure to include lots of fluids daily.  Avoid fast food or heavy meals as your are more likely to get nauseated.  Eat a low fat the next few days after surgery.   3. PAIN CONTROL: a. Pain is best controlled by a usual combination of three different methods TOGETHER: i. Ice/Heat ii. Over the counter pain medication iii. Prescription pain medication b. Most patients will experience some swelling and discomfort in the anus/rectal area. and incisions.  Ice packs or heat (30-60 minutes up to 6 times a day) will help. Use ice for the first few days to help decrease swelling and bruising, then switch to heat such as warm towels, sitz baths, warm baths, etc to help relax tight/sore spots and speed recovery.  Some people prefer to use ice alone, heat alone, alternating between ice & heat.  Experiment to what works for you.  Swelling and bruising can take several weeks to resolve.   c. It is helpful to take an over-the-counter pain medication regularly for the first few weeks.  Choose one of the following that works best for you: i. Naproxen (Aleve, etc)  Two 220mg tabs twice a day ii. Ibuprofen (Advil, etc) Three 200mg tabs four times a day (every meal & bedtime) iii. Acetaminophen (Tylenol, etc) 500-650mg four times a day (every meal & bedtime) d. A  prescription for pain medication (such as oxycodone, hydrocodone, etc) should be given to you upon discharge.  Take   your pain medication as prescribed.  i. If you are having problems/concerns with the prescription medicine (does not control pain, nausea, vomiting, rash, itching, etc), please call us [phone] to see if we need to switch you to a different pain medicine that  will work better for you and/or control your side effect better. ii. If you need a refill on your pain medication, please contact your pharmacy.  They will contact our office to request authorization. Prescriptions will not be filled after 5 pm or on week-ends.  Use a Sitz Bath 4-8 times a day for relief A sitz bath is a warm water bath taken in the sitting position that covers only the hips and buttocks. It may be used for either healing or hygiene purposes. Sitz baths are also used to relieve pain, itching, or muscle spasms. The water may contain medicine. Moist heat will help you heal and relax.  HOME CARE INSTRUCTIONS  Take 3 to 4 sitz baths a day. 6. Fill the bathtub half full with warm water. 7. Sit in the water and open the drain a little. 8. Turn on the warm water to keep the tub half full. Keep the water running constantly. 9. Soak in the water for 15 to 20 minutes. 10. After the sitz bath, pat the affected area dry first. SEEK MEDICAL CARE IF:  You get worse instead of better. Stop the sitz baths if you get worse.   4. KEEP YOUR BOWELS REGULAR a. The goal is one bowel movement a day b. Avoid getting constipated.  Between the surgery and the pain medications, it is common to experience some constipation.  Increasing fluid intake and taking a fiber supplement (such as Metamucil, Citrucel, FiberCon, MiraLax, etc) 1-2 times a day regularly will usually help prevent this problem from occurring.  A mild laxative (prune juice, Milk of Magnesia, MiraLax, etc) should be taken according to package directions if there are no bowel movements after 48 hours. c. Watch out for diarrhea.  If you have many loose bowel movements, simplify your diet to bland foods & liquids for a few days.  Stop any stool softeners and decrease your fiber supplement.  Switching to mild anti-diarrheal medications (Kayopectate, Pepto Bismol) can help.  If this worsens or does not improve, please call us.  5. Wound  Care a. Remove your bandages the day after surgery.  Unless discharge instructions indicate otherwise, leave your bandage dry and in place overnight.  Remove the bandage during your first bowel movement.   b. Allow the wound packing to fall out over the next few days.  You can trim exposed gauze / ribbon as it falls out.  You do not need to repack the wound unless instructed otherwise.  Wear an absorbent pad or soft cotton gauze in your underwear as needed to catch any drainage and help keep the area  c. Keep the area clean and dry.  Bathe / shower every day.  Keep the area clean by showering / bathing over the incision / wound.   It is okay to soak an open wound to help wash it.  Wet wipes or showers / gentle washing after bowel movements is often less traumatic than regular toilet paper. d. You may have some styrofoam-like soft packing in the rectum which will come out with the first bowel movement.  e. You will often notice bleeding with bowel movements.  This should slow down by the end of the first week of surgery f. Expect some   drainage.  This should slow down, too, by the end of the first week of surgery.  Wear an absorbent pad or soft cotton gauze in your underwear until the drainage stops. 6. ACTIVITIES as tolerated:   a. You may resume regular (light) daily activities beginning the next day-such as daily self-care, walking, climbing stairs-gradually increasing activities as tolerated.  If you can walk 30 minutes without difficulty, it is safe to try more intense activity such as jogging, treadmill, bicycling, low-impact aerobics, swimming, etc. b. Save the most intensive and strenuous activity for last such as sit-ups, heavy lifting, contact sports, etc  Refrain from any heavy lifting or straining until you are off narcotics for pain control.   c. DO NOT PUSH THROUGH PAIN.  Let pain be your guide: If it hurts to do something, don't do it.  Pain is your body warning you to avoid that activity for  another week until the pain goes down. d. You may drive when you are no longer taking prescription pain medication, you can comfortably sit for long periods of time, and you can safely maneuver your car and apply brakes. e. You may have sexual intercourse when it is comfortable.  7. FOLLOW UP in our office a. Please call [facility] at [phone] to set up an appointment to see your surgeon in the office for a follow-up appointment approximately 2 weeks after your surgery. b. Make sure that you call for this appointment the day you arrive home to insure a convenient appointment time. 10. IF YOU HAVE DISABILITY OR FAMILY LEAVE FORMS, BRING THEM TO THE OFFICE FOR PROCESSING.  DO NOT GIVE THEM TO YOUR DOCTOR.        WHEN TO CALL US [phone]: 1. Poor pain control 2. Reactions / problems with new medications (rash/itching, nausea, etc)  3. Fever over 101.5 F (38.5 C) 4. Inability to urinate 5. Nausea and/or vomiting 6. Worsening swelling or bruising 7. Continued bleeding from incision. 8. Increased pain, redness, or drainage from the incision  The clinic staff is available to answer your questions during regular business hours (8:30am-5pm).  Please don't hesitate to call and ask to speak to one of our nurses for clinical concerns.   A surgeon from [facility] is always on call at the hospitals   If you have a medical emergency, go to the nearest emergency room or call 911.    [facility] [address], [address], [place], [place]  [address] ? MAIN: [phone] ? TOLL FREE: [phone] ? FAX [phone] [email or web address]    

## 2013-03-08 DIAGNOSIS — G4730 Sleep apnea, unspecified: Secondary | ICD-10-CM

## 2013-03-08 HISTORY — DX: Sleep apnea, unspecified: G47.30

## 2013-03-13 ENCOUNTER — Encounter (INDEPENDENT_AMBULATORY_CARE_PROVIDER_SITE_OTHER): Payer: BC Managed Care – PPO | Admitting: Surgery

## 2013-04-12 ENCOUNTER — Other Ambulatory Visit: Payer: Self-pay | Admitting: Family Medicine

## 2013-04-12 NOTE — Telephone Encounter (Signed)
Is this okay to refill? 

## 2013-04-12 NOTE — Telephone Encounter (Signed)
Yes.  #30 with 2 refills ok

## 2013-04-20 ENCOUNTER — Telehealth: Payer: Self-pay | Admitting: Internal Medicine

## 2013-04-20 ENCOUNTER — Encounter: Payer: Self-pay | Admitting: Family Medicine

## 2013-04-20 ENCOUNTER — Ambulatory Visit (INDEPENDENT_AMBULATORY_CARE_PROVIDER_SITE_OTHER): Payer: BC Managed Care – PPO | Admitting: Family Medicine

## 2013-04-20 VITALS — BP 100/70 | HR 74 | Temp 98.3°F | Wt 141.0 lb

## 2013-04-20 DIAGNOSIS — J019 Acute sinusitis, unspecified: Secondary | ICD-10-CM

## 2013-04-20 MED ORDER — AMOXICILLIN 875 MG PO TABS: 875.0000 mg | ORAL_TABLET | Freq: Two times a day (BID) | ORAL | Status: DC

## 2013-04-20 NOTE — Telephone Encounter (Signed)
Anna Mckinney was given a zpak because she clearly had an ear infection on exam.  She likely has a virus, and should take OTC decongestants (I recommend Mucinex-D), and use tylenol or ibuprofen for pain/fever.  eval next week if not better.

## 2013-04-20 NOTE — Telephone Encounter (Signed)
Pt called and said she is stopped up and can't breath out of her nose. She has a cough and low grade fever. Pt brought in sheila the other day and thinks she got the same thing she has and wants something, send to [facility] [place]

## 2013-04-20 NOTE — Progress Notes (Signed)
   Subjective:    Patient ID: Anna Mckinney, female    DOB: 04/09/1950, 63 y.o.   MRN: [id number]  HPI She has a one-day history of sore throat, nasal congestion, purulent rhinorrhea with PND, myalgias and arthralgias with a slight fever and right earache. She also has a dry cough. Her daughter had similar symptoms a started earlier in the week   Review of Systems     Objective:   Physical Exam alert and in no distress. Tympanic membranes and canals are normal. Throat is clear. Tonsils are normal. Neck is supple without adenopathy or thyromegaly. Cardiac exam shows a regular sinus rhythm without murmurs or gallops. Lungs are clear to auscultation. Nasal mucosa is red. Tender over frontal and maxillary sinuses        Assessment & Plan:  Acute sinusitis - Plan: amoxicillin (AMOXIL) 875 MG tablet  She is to call if not entirely better when she finishes the antibiotic.

## 2013-04-20 NOTE — Patient Instructions (Addendum)
Take 2 Aleve twice per day and also use Tylenol 2 of them every 4 hours. Take whatever cough suppressant works. Take all the antibiotic and if not totally back to normal when you don't give me a call

## 2013-04-20 NOTE — Telephone Encounter (Signed)
Pt was informed of Dr. Johnsie Kindred message pt got very upset. Pt tried to argue and pt was informed that we have availability and she could make an appt. Pt states she will call back.

## 2013-06-27 ENCOUNTER — Encounter: Payer: Self-pay | Admitting: Medical

## 2013-06-27 ENCOUNTER — Ambulatory Visit (INDEPENDENT_AMBULATORY_CARE_PROVIDER_SITE_OTHER): Payer: BC Managed Care – PPO | Admitting: Medical

## 2013-06-27 VITALS — BP 122/78 | HR 58 | Temp 97.8°F | Resp 14 | Wt 145.0 lb

## 2013-06-27 DIAGNOSIS — H6690 Otitis media, unspecified, unspecified ear: Secondary | ICD-10-CM

## 2013-06-27 DIAGNOSIS — J320 Chronic maxillary sinusitis: Secondary | ICD-10-CM

## 2013-06-27 MED ORDER — AMOXICILLIN-POT CLAVULANATE 875-125 MG PO TABS: 1.0000 | ORAL_TABLET | Freq: Two times a day (BID) | ORAL | Status: DC

## 2013-06-27 NOTE — Patient Instructions (Signed)
  Thank you for giving me the opportunity to serve you today.    Your diagnosis today includes: Encounter Diagnosis  Name Primary?  . Maxillary sinusitis Yes     Specific recommendations today include:  Begin Augmentin antibiotic  Increase water intake  Use Mucinex DM OTC for cough/congestion  Begin Zyrtec x 1 month at bedtime daily for allergies  Return if symptoms worsen or fail to improve.

## 2013-06-27 NOTE — Progress Notes (Signed)
Subjective:  Anna Mckinney is a 63 y.o. female who presents for possible sinus infection.  Symptoms include facial pressure, left ear pressure, neck fullness, all worse left side x a week, some loose stool, throat sore, drainage down back of throat.  Nothing seems to come out of the nose when blowing.  Has some cough.  Denies fever, nausea, vomiting. No wheezing, SOB.  Patient is a non-smoker.  Using tylenol for symptoms.  Denies sick contacts.  No other aggravating or relieving factors.  No other c/o.  ROS as in subjective   Objective: Filed Vitals:   06/27/13 0820  BP: 122/78  Pulse: 58  Temp: 97.8 F (36.6 C)  Resp: 14    General appearance: Alert, WD/WN, no distress                             Skin: warm, no rash                           Head: + left maxillary sinus tenderness,                            Eyes: conjunctiva normal, corneas clear, PERRLA                            Ears: left TM erythema, right TM pearly, external ear canals normal                          Nose: septum midline, turbinates swollen, with erythema and clear discharge             Mouth/throat: MMM, tongue normal, mild pharyngeal erythema                           Neck: supple, no adenopathy, no thyromegaly, nontender                          Heart: RRR, normal S1, S2, no murmurs                         Lungs:somewhat bronchial breath sounds throughout, no wheezes, rales, or rhonchi      Assessment and Plan: Encounter Diagnoses  Name Primary?  . Maxillary sinusitis Yes  . Otitis media     Discussed findings, treatment.    Specific recommendations today include:  Begin Augmentin antibiotic  Increase water intake  Use Mucinex DM OTC for cough/congestion  Begin Zyrtec x 1 month at bedtime daily for allergies  Tylenol or Ibuprofen OTC for fever and malaise.    Discussed symptomatic relief, nasal saline flush, and call or return if worse or not improving in 5-7 days.

## 2013-07-09 ENCOUNTER — Telehealth: Payer: Self-pay | Admitting: Family Medicine

## 2013-07-09 ENCOUNTER — Telehealth: Payer: Self-pay | Admitting: *Deleted

## 2013-07-09 ENCOUNTER — Other Ambulatory Visit: Payer: BC Managed Care – PPO

## 2013-07-09 DIAGNOSIS — Z0000 Encounter for general adult medical examination without abnormal findings: Secondary | ICD-10-CM

## 2013-07-09 DIAGNOSIS — E7800 Pure hypercholesterolemia, unspecified: Secondary | ICD-10-CM

## 2013-07-09 DIAGNOSIS — E785 Hyperlipidemia, unspecified: Secondary | ICD-10-CM

## 2013-07-09 NOTE — Telephone Encounter (Signed)
Pt was advised by [place] and Mickel Baas

## 2013-07-09 NOTE — Telephone Encounter (Signed)
Mom wants to know if you can do Sherlynn Stalls and Freda Munro cpe same day?  She said it would be wonderful.

## 2013-07-09 NOTE — Telephone Encounter (Signed)
Patient was on schedule for labs this morning-no orders in system. Blood was drawn and is being held in the lab for orders to be entered. Has CPE this week.

## 2013-07-09 NOTE — Telephone Encounter (Signed)
Anna Mckinney called again this afternoon wanting to know if this was possible as she said it would be much easier.  I advised pt I doubted it but would await response from Dr. Tomi Bamberger.

## 2013-07-09 NOTE — Telephone Encounter (Signed)
Advise Anna Mckinney that their physicals are sandwich between other pt's med checks, and there is  No room to add them to either day to have them be on the same day.  She needs to schedule it that way, far in advance, to be done on the same day.

## 2013-07-09 NOTE — Telephone Encounter (Signed)
Lipid  Glucose TSH

## 2013-07-10 ENCOUNTER — Ambulatory Visit
Admission: RE | Admit: 2013-07-10 | Discharge: 2013-07-10 | Disposition: A | Discharge status: Home / Self Care | Payer: BC Managed Care – PPO | Source: Ambulatory Visit | Attending: Medical | Admitting: Medical

## 2013-07-10 ENCOUNTER — Encounter: Payer: Self-pay | Admitting: Medical

## 2013-07-10 ENCOUNTER — Ambulatory Visit (INDEPENDENT_AMBULATORY_CARE_PROVIDER_SITE_OTHER): Payer: BC Managed Care – PPO | Admitting: Medical

## 2013-07-10 ENCOUNTER — Other Ambulatory Visit: Payer: Self-pay | Admitting: Medical

## 2013-07-10 ENCOUNTER — Telehealth: Payer: Self-pay | Admitting: Medical

## 2013-07-10 VITALS — BP 102/70 | HR 56 | Temp 98.1°F | Resp 14 | Wt 143.0 lb

## 2013-07-10 DIAGNOSIS — F17200 Nicotine dependence, unspecified, uncomplicated: Secondary | ICD-10-CM

## 2013-07-10 DIAGNOSIS — Z87891 Personal history of nicotine dependence: Secondary | ICD-10-CM

## 2013-07-10 DIAGNOSIS — R0600 Dyspnea, unspecified: Secondary | ICD-10-CM

## 2013-07-10 DIAGNOSIS — R0989 Other specified symptoms and signs involving the circulatory and respiratory systems: Secondary | ICD-10-CM

## 2013-07-10 DIAGNOSIS — R0609 Other forms of dyspnea: Secondary | ICD-10-CM

## 2013-07-10 LAB — LIPID PANEL
Cholesterol: 195 mg/dL (ref 0–200)
HDL: 31 mg/dL — AB (ref >39)
LDL CALC: 138 mg/dL — AB (ref 0–99)
TRIGLYCERIDES: 132 mg/dL (ref <150)
Total CHOL/HDL Ratio: 6.3 Ratio
VLDL: 26 mg/dL (ref 0–40)

## 2013-07-10 LAB — GLUCOSE, RANDOM: Glucose, Bld: 98 mg/dL (ref 70–99)

## 2013-07-10 LAB — TSH: TSH: 3.174 u[IU]/mL (ref 0.350–4.500)

## 2013-07-10 NOTE — Telephone Encounter (Signed)
When you call her back about xray - ask about the following:  1) does she snore? 2) does she quit breathing in her sleep, has anyone witnessed this? 3) does she awake rested? 4) does she get really sleepy in the daytime? 5) any history of evaluation for sleep apnea?

## 2013-07-10 NOTE — Progress Notes (Signed)
Subjective:  Anna Mckinney is a 63 y.o. female who presents for breathing concern.  She is a former smoker, 15+pack year history, but quit 5 years ago.  Been in usual state of health but last night about 2am awoke out of the blue gasping for air.  Denies prior similar.  Denies chest pain, palpitations, edema, no cough, congestion, no other symptoms.  It took about 30 minutes for breathing to return to normal.  Denies anxiety, bad dreams, and she doesn't think this was anxiety/panic related.   Just came out of the blue.  Current feels fine.  She wants eval to find out what happened. No other aggravating or relieving factors.  No other c/o.  The following portions of the patient's history were reviewed and updated as appropriate: allergies, current medications, past family history, past medical history, past social history, past surgical history and problem list.  ROS as in subjective  Past Medical History  Diagnosis Date  . Barrett's esophagus     last EGD 2011 in [facility]  . Osteopenia   . Internal prolapsed hemorrhoids s/p THD hemorrhoidal ligation/pexy 10/16/2012  . PONV (postoperative nausea and vomiting)     PT'S HEART RATE DOWN TO 40 WITH HER LAST SURGERY - SHE REMEMBERS BEING ASKED TO WAKE UP SO HEART RATE WOULD GO UP - SCARED HER.  [name] GERD (gastroesophageal reflux disease)      Objective: BP 102/70  Pulse 56  Temp(Src) 98.1 F (36.7 C) (Oral)  Resp 14  Wt 143 lb (64.864 kg)  SpO2 99%   General appearance: Alert, WD/WN, no distress                             Skin: warm, no rash, no diaphoresis                           Head: no sinus tenderness                            Eyes: conjunctiva normal, corneas clear, PERRLA                            Ears: pearly TMs, external ear canals normal                          Nose: septum midline, turbinates swollen, with erythema and clear discharge             Mouth/throat: MMM, tongue normal, mild pharyngeal erythema             Neck: supple, no adenopathy, no thyromegaly, nontender                          Heart: RRR, normal S1, S2, no murmurs                         Lungs: +bronchial sounds, no rhonchi, no wheezes, no rales                Extremities: no edema, nontender     Adult ECG Report  Indication: paroxysmal nocturnal dyspnea  Rate: 55 bpm  Rhythm: sinus bradycardia  QRS Axis: 35 degrees  PR Interval: 123ms  QRS Duration: 33ms  QTc: 461ms  Conduction Disturbances: none  Other Abnormalities: poor R wave progression  Patient's cardiac risk factors are: former smoker.  EKG comparison: none  Narrative Interpretation: sinus bradycardia, possible anterior infarct age undetermined    Assessment: Encounter Diagnoses  Name Primary?  . PND (paroxysmal nocturnal dyspnea) Yes  . Former smoker       Plan:  EKG reviewed.  Will send for CXR.  Need to eval for COPD, consider PFTs, consider treatment for reactive airway.   Consider sleep apnea eval.  Reviewed prior labs, recent visit notes here.

## 2013-07-11 NOTE — Telephone Encounter (Signed)
Ok.  C/t same plan, go for PFTs, and we will consider sleep study if PFTs normal.

## 2013-07-11 NOTE — Telephone Encounter (Signed)
Working on PFT appointment. CLS

## 2013-07-11 NOTE — Telephone Encounter (Signed)
Patient states that she does snore. No one has witnessed her stop breathing during her sleep. She doesn't wake up everyday well rested. Does not feel during the day. No prior sleep study. She takes Ambien sometimes to help her sleep. CLS

## 2013-07-12 ENCOUNTER — Encounter: Payer: BC Managed Care – PPO | Admitting: Family Medicine

## 2013-07-17 ENCOUNTER — Telehealth: Payer: Self-pay | Admitting: Family Medicine

## 2013-07-17 NOTE — Telephone Encounter (Signed)
Tried to call yesterday no voicemail. CLS

## 2013-07-17 NOTE — Telephone Encounter (Signed)
Patient has an appointment for a PFT on August 14, 2013 @ 400 pm at [facility] [phone]. CLS [address] [place], [place]

## 2013-07-17 NOTE — Telephone Encounter (Signed)
I tried to call both numbers again cell phone doesn't have voicemail set up and the house number voicemail just beaps. CLS

## 2013-07-17 NOTE — Telephone Encounter (Signed)
I tried to call both numbers today no voicemail. CLS

## 2013-07-18 ENCOUNTER — Encounter: Payer: Self-pay | Admitting: Internal Medicine

## 2013-07-18 NOTE — Telephone Encounter (Signed)
Mailed her a copy of her appt time and date

## 2013-07-18 NOTE — Telephone Encounter (Signed)
Tried to call pt- her cell phone voicemail is not set up and the house phone just rings and rings

## 2013-08-14 ENCOUNTER — Ambulatory Visit (INDEPENDENT_AMBULATORY_CARE_PROVIDER_SITE_OTHER): Payer: BC Managed Care – PPO | Admitting: Internal Medicine

## 2013-08-14 DIAGNOSIS — R0600 Dyspnea, unspecified: Secondary | ICD-10-CM

## 2013-08-14 DIAGNOSIS — F17200 Nicotine dependence, unspecified, uncomplicated: Secondary | ICD-10-CM

## 2013-08-14 NOTE — Progress Notes (Signed)
PFT done today. 

## 2013-08-15 LAB — PULMONARY FUNCTION TEST
DL/VA % pred: 117 %
DL/VA: 5.49 ml/min/mmHg/L
DLCO unc % pred: 111 %
DLCO unc: 25.46 ml/min/mmHg
FEF 25-75 POST: 3.11 L/s
FEF 25-75 PRE: 3.23 L/s
FEF2575-%Change-Post: -3 %
FEF2575-%Pred-Post: 140 %
FEF2575-%Pred-Pre: 146 %
FEV1-%Change-Post: 0 %
FEV1-%PRED-POST: 112 %
FEV1-%Pred-Pre: 112 %
FEV1-PRE: 2.71 L
FEV1-Post: 2.7 L
FEV1FVC-%Change-Post: 3 %
FEV1FVC-%PRED-PRE: 108 %
FEV6-%CHANGE-POST: -3 %
FEV6-%PRED-POST: 102 %
FEV6-%Pred-Pre: 106 %
FEV6-POST: 3.08 L
FEV6-Pre: 3.19 L
FEV6FVC-%CHANGE-POST: 0 %
FEV6FVC-%PRED-POST: 104 %
FEV6FVC-%Pred-Pre: 103 %
FVC-%Change-Post: -3 %
FVC-%Pred-Post: 98 %
FVC-%Pred-Pre: 102 %
FVC-POST: 3.08 L
FVC-Pre: 3.21 L
PRE FEV1/FVC RATIO: 85 %
Post FEV1/FVC ratio: 88 %
Post FEV6/FVC ratio: 100 %
Pre FEV6/FVC Ratio: 100 %

## 2013-08-27 ENCOUNTER — Telehealth: Payer: Self-pay | Admitting: Family Medicine

## 2013-08-27 MED ORDER — ZOLPIDEM TARTRATE ER 12.5 MG PO TBCR: EXTENDED_RELEASE_TABLET | ORAL | Status: DC

## 2013-08-27 NOTE — Telephone Encounter (Signed)
Last rx'd for #30 with 2 refils in 04/2013.  Okay to refill #30, no refill (for the CR on her med list)

## 2013-08-28 ENCOUNTER — Encounter: Payer: Self-pay | Admitting: Medical

## 2013-08-28 ENCOUNTER — Ambulatory Visit (INDEPENDENT_AMBULATORY_CARE_PROVIDER_SITE_OTHER): Payer: BC Managed Care – PPO | Admitting: Medical

## 2013-08-28 VITALS — BP 120/80 | HR 60 | Temp 98.1°F | Resp 16 | Wt 142.0 lb

## 2013-08-28 DIAGNOSIS — IMO0001 Reserved for inherently not codable concepts without codable children: Secondary | ICD-10-CM

## 2013-08-28 DIAGNOSIS — M791 Myalgia, unspecified site: Secondary | ICD-10-CM

## 2013-08-28 DIAGNOSIS — R05 Cough: Secondary | ICD-10-CM

## 2013-08-28 DIAGNOSIS — J029 Acute pharyngitis, unspecified: Secondary | ICD-10-CM

## 2013-08-28 DIAGNOSIS — R059 Cough, unspecified: Secondary | ICD-10-CM

## 2013-08-28 DIAGNOSIS — R509 Fever, unspecified: Secondary | ICD-10-CM

## 2013-08-28 LAB — POCT RAPID STREP A (OFFICE): RAPID STREP A SCREEN: NEGATIVE

## 2013-08-28 LAB — POCT INFLUENZA A/B
INFLUENZA A, POC: NEGATIVE
INFLUENZA B, POC: NEGATIVE

## 2013-08-28 MED ORDER — PROMETHAZINE-DM 6.25-15 MG/5ML PO SYRP: 5.0000 mL | ORAL_SOLUTION | Freq: Four times a day (QID) | ORAL | Status: DC | PRN

## 2013-08-28 NOTE — Progress Notes (Signed)
    Subjective: Patient is a 63 y.o. female presenting for 1 day history of throat pain and feeling sick. Her throat began feeling sore yesterday and has had difficulty swallowing. She has also felt feverish subjective, has had chills, a posterior headache, congestion, swollen eyelids, ear pain (L>R), some upper left-sided gum pain, cough with post-tussive dry heaving, nausea without vomiting, muscle aches and loose stool x 3 today. Denies chest pain, sob, difficulty breathing, wheezing, chest congestion, dysuria, hematuria, blood in the stool. She took 2 Tylenol, mucinex today without relief. Not currently smoking, quit 5 years ago, used to smoke 1/2 ppd for 30 years. She has had sick contacts, her daughter was seen here yesterday for cold symptoms. Her husband was also recently ill. She also admits being prone to sinus infections.   Of note, the patient recently lost her brother , last Wednesday, due to complications of choledocholithiasis and sepsis.  Given this, she hasn't been sleeping well in general, was up for 3 days straight last week with brother prior to his passing.  Patient has no other questions or concerns.  ROS as in subjective.   Objective:  BP 120/80  Pulse 60  Temp(Src) 98.1 F (36.7 C) (Oral)  Resp 16  Wt 142 lb (64.411 kg)   General appearance: alert, no distress,somewhat ill appearing HEENT: normocephalic, sclerae anicteric, conjunctiva mildly injected, R>L, TMs flat , nares erythematous with clear discharge, pharynx mildly erythematous but without exudates; tenderness over maxillary sinus L>R Oral cavity: MMM, no lesions Neck: supple, no lymphadenopathy, no thyromegaly, no masses Heart: RRR, normal S1, S2, no murmurs Lungs:clear, no wheezes, rhonchi, or rales Abdomen: +bs, non-tender, non-distended, no hepatomegaly Pulses: 2+ symmetric     Assessment: Encounter Diagnoses  Name Primary?  . Chills with fever Yes  . Myalgia   . Cough   . Sore throat       Plan: Flu swab negative.   Strep swab negative.   discussed her symptoms and exam suggestive of viral/flu like illness/bad URI cold.  discussed supportive care, rest, hydration, can use Tylenol or Ibuprofen for myalgias, chills, fever.  discussed usual course of illness.  Script for promethazine DM for worse cough prn.  Call if worse or not improving.

## 2013-10-01 ENCOUNTER — Encounter: Payer: Self-pay | Admitting: Family Medicine

## 2013-10-01 ENCOUNTER — Ambulatory Visit (INDEPENDENT_AMBULATORY_CARE_PROVIDER_SITE_OTHER): Payer: BC Managed Care – PPO | Admitting: Family Medicine

## 2013-10-01 VITALS — BP 108/72 | HR 60 | Ht 63.5 in | Wt 143.0 lb

## 2013-10-01 DIAGNOSIS — K2270 Barrett's esophagus without dysplasia: Secondary | ICD-10-CM

## 2013-10-01 DIAGNOSIS — Z23 Encounter for immunization: Secondary | ICD-10-CM

## 2013-10-01 DIAGNOSIS — M8580 Other specified disorders of bone density and structure, unspecified site: Secondary | ICD-10-CM

## 2013-10-01 DIAGNOSIS — K219 Gastro-esophageal reflux disease without esophagitis: Secondary | ICD-10-CM

## 2013-10-01 DIAGNOSIS — M899 Disorder of bone, unspecified: Secondary | ICD-10-CM

## 2013-10-01 DIAGNOSIS — E785 Hyperlipidemia, unspecified: Secondary | ICD-10-CM

## 2013-10-01 DIAGNOSIS — G4700 Insomnia, unspecified: Secondary | ICD-10-CM

## 2013-10-01 DIAGNOSIS — M949 Disorder of cartilage, unspecified: Secondary | ICD-10-CM

## 2013-10-01 DIAGNOSIS — Z0000 Encounter for general adult medical examination without abnormal findings: Secondary | ICD-10-CM

## 2013-10-01 DIAGNOSIS — Z2911 Encounter for prophylactic immunotherapy for respiratory syncytial virus (RSV): Secondary | ICD-10-CM

## 2013-10-01 DIAGNOSIS — Z79899 Other long term (current) drug therapy: Secondary | ICD-10-CM

## 2013-10-01 LAB — POCT URINALYSIS DIPSTICK
BILIRUBIN UA: NEGATIVE
Glucose, UA: NEGATIVE
Ketones, UA: NEGATIVE
Leukocytes, UA: NEGATIVE
NITRITE UA: NEGATIVE
PH UA: 5
Protein, UA: NEGATIVE
RBC UA: NEGATIVE
SPEC GRAV UA: 1.02
Urobilinogen, UA: NEGATIVE

## 2013-10-01 MED ORDER — NIACIN ER (ANTIHYPERLIPIDEMIC) 500 MG PO TBCR: EXTENDED_RELEASE_TABLET | ORAL | Status: DC

## 2013-10-01 MED ORDER — ZOLPIDEM TARTRATE ER 12.5 MG PO TBCR: EXTENDED_RELEASE_TABLET | ORAL | Status: DC

## 2013-10-01 NOTE — Progress Notes (Signed)
Chief Complaint  Patient presents with  . Annual Exam    fasting annual exam with pap. Did not do eye exam she is scheduling one for the next week or so.  Does feel like she may have some urinary issues, was taking Cipro for UTI - finshed last dose 09/26/13. Has form on front of chart that she needs filled out today.    Anna Mckinney is a 63 y.o. female who presents for a complete physical.  She has the following concerns:  She went to urgent care 8 days ago with pressure over her bladder, along with urinary frequency.  She was treated with Cipro and ditropan.  She was called a few days later, and told there was no infection.  She stopped taking both meds (had taken 3 days of the cipro).  Symptoms are significantly improved, however still has some intermittent urgency/frequency and suprapubic pressure.  Insomnia--she has been needing to take Ambien CR most days She feels very tired and sluggish during the day without it, as she has a hard time falling asleep. She is able to get up to bathroom and tend to her daughter, if needed, with the medication. Denies side effects.   Dyslipidemia:  She had labs done in May--here to f/u and discuss the results. She didn't tolerate statins in the past.  Had PFT's in June (normal), sent by Audelia Acton (after episode of gasping for air).  She also had a home sleep study done recently (through [facility])--no results back yet (just turned it in last week).  No further gasping episodes  Mild persistent cough--she saw pulmonary in the past (Dr. Arnetha Gula who told her to avoid fish oil. She was told it was related to her GERD, and nothing was wrong with her lungs.   Immunization History  Administered Date(s) Administered  . Influenza Split 01/12/2011, 12/17/2011  . Influenza Whole 01/06/2007, 11/27/2007, 11/27/2009  . Influenza,inj,Quad PF,36+ Mos 12/14/2012  . Pneumococcal Conjugate-13 07/09/2005  . Td 07/09/2005  . Tdap 07/29/2011   Last pap 07/2010, s/p  hysterectomy.  She had h/o abnormal pap, conization, but told they "didn't get it all", still had abnormal cells. Hysterectomy was at age 42. No abnormal paps since then. Mammogram 10/2012 Colonoscopy: 2012 at [facility]; had polyps per pt.  She is unsure when she was told she was due again. EGD UTD per pt (also done by Dr. Ferdinand Lango in [facility]) DEXA 07/2010 (osteopenia)  Sees dentist and ophtho regularly Goes to tanning beds (last in April) Exercise:  Housework, and caring for Freda Munro; dances some. She used to do [place]  Past Medical History  Diagnosis Date  . Barrett's esophagus      [place] (Dr. Ferdinand Lango)  . Osteopenia   . Internal prolapsed hemorrhoids s/p THD hemorrhoidal ligation/pexy 10/16/2012  . PONV (postoperative nausea and vomiting)     PT'S HEART RATE DOWN TO 40 WITH HER LAST SURGERY - SHE REMEMBERS BEING ASKED TO WAKE UP SO HEART RATE WOULD GO UP - SCARED HER.  [name] GERD (gastroesophageal reflux disease)   . Insomnia    Past Surgical History  Procedure Laterality Date  . Cholecystectomy  2010  . Abdominal hysterectomy  1984    1 ovary remains; removed for "cancer cells"  . Transanal hemorrhoidal dearterialization  10/20/2012    hemorrhoidal ligation and pexy.  [name] Posterolateral external hemorrhoidactomy  10/20/2012    left  . Evaluation under anesthesia with hemorrhoidectomy N/A 01/25/2013    Procedure: EXAM UNDER ANESTHESIA  WITH HEMORRHOIDECTOMY;  Surgeon: Adin Hector, MD;  Location: [facility];  Service: General;  Laterality: N/A;   History   Social History  . Marital Status: Married    Spouse Name: N/A    Number of Children: 2  . Years of Education: N/A   Occupational History  . 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 Smoking status: Former Smoker -- 0.30 packs/day for 20 years    Types: Cigarettes    Quit date: 01/24/2009  . Smokeless tobacco: Never Used  . Alcohol Use: No  . Drug Use: No  . Sexual Activity: Not on file   Other  Topics Concern  . Not on file   Social History Narrative   Lives with husband, daughter (with [place] is her caregiver) and 1 dog (boxer)    Outpatient Encounter Prescriptions as of 10/01/2013  Medication Sig Note  . acidophilus (RISAQUAD) CAPS capsule Take 1 capsule by mouth daily.   . Cholecalciferol (VITAMIN D3) 5000 UNITS TABS Take 1 tablet by mouth daily.   [name] esomeprazole (NEXIUM) 40 MG capsule Take 40 mg by mouth. IN AM 10/01/2013: Admits to only taking it twice a week   . vitamin C (ASCORBIC ACID) 500 MG tablet Take 500 mg by mouth 2 (two) times daily.   [name] zolpidem (AMBIEN CR) 12.5 MG CR tablet take 1 tablet by mouth once daily 10/01/2013: Uses it most days  . oxybutynin (DITROPAN) 5 MG tablet Take 5 mg by mouth daily.  10/01/2013: Prescribed by urgent care last week; not taking now  . [DISCONTINUED] promethazine-dextromethorphan (PROMETHAZINE-DM) 6.25-15 MG/5ML syrup Take 5 mLs by mouth 4 (four) times daily as needed for cough.    Allergies  Allergen Reactions  . Hydrocodone Itching and Nausea And Vomiting  . Codeine    Review of Systems  The patient denies anorexia, fever, no recent weight changes, denies headaches, vision changes, decreased hearing, ear pain, breast concerns, chest pain, palpitations, dizziness, syncope, dyspnea on exertion, nausea, vomiting, constipation, abdominal pain, melena, no vaginal bleeding, vaginal discharge, odor or itch, genital lesions, numbness, tingling, weakness, tremor, suspicious skin lesions, depression, anxiety, abnormal bleeding/bruising, or enlarged lymph nodes. +decreased libido.  +thoracic back pain. Rarely uses tramadol (helps with pain, but causes insomnia). 14# weight loss compared to last physical 1 year ago--per flowsheet, weight was lost by 11/2012, has been maintaining current weight Occasional dry cough. No recurrent heartburn or reflux, despite only taking Nexium twice weekly.  Denies dysphagia.  PHYSICAL EXAM:  BP  108/72  Pulse 60  Ht 5' 3.5" (1.613 m)  Wt 143 lb (64.864 kg)  BMI 24.93 kg/m2  General Appearance:  Alert, cooperative, no distress, appears stated age; occasional throat-clearing, no cough   Head:  Normocephalic, without obvious abnormality, atraumatic   Eyes:  PERRL, conjunctiva/corneas clear, EOM's intact, fundi  benign   Ears:  Normal TM's and external ear canals   Nose:  Nares normal   Throat:  Lips, mucosa, and tongue normal; teeth and gums normal   Neck:  Supple, no lymphadenopathy; thyroid: no enlargement/tenderness/nodules; no carotid  bruit or JVD   Back:  Spine nontender, no curvature, ROM normal, no CVA tenderness   Lungs:  Clear to auscultation bilaterally without wheezes, rales or ronchi; respirations unlabored   Chest Wall:  No tenderness or deformity   Heart:  Regular rate and rhythm, S1 and S2 normal, no murmur, rub  or gallop   Breast Exam:  No tenderness, masses,  or nipple discharge or inversion. No axillary lymphadenopathy.  There is a little more prominent fibroglandular tissue on the left (upper outer quandrant) than on the right; slightly tender.    Abdomen:  Soft, non-tender, nondistended, normoactive bowel sounds,  no masses, no hepatosplenomegaly   Genitalia:  Normal external genitalia without lesions. Appears to have some healed/healing cysts/abscesses within the pubic hair area--no acute areas of inflammation, redness, drainage; nontender. BUS and vagina normal; No abnormal vaginal discharge. Uterus is absent. No adnexal masses, R adnexa palpable, nontender, not enlarged. Pap not performed  Rectal:  Normal tone, guaiac negative stool, no masses.  Hemorrhoidal tags only  Extremities:  No clubbing, cyanosis or edema   Pulses:  2+ and symmetric all extremities   Skin:  Skin color, texture, turgor normal, no rashes or lesions   Lymph nodes:  Cervical, supraclavicular, and axillary nodes normal   Neurologic:  CNII-XII intact, normal strength, sensation and gait;  reflexes 2+ and symmetric throughout          Psych: Normal mood, affect, hygiene and grooming.    Lab Results  Component Value Date   CHOL 195 07/09/2013   HDL 31* 07/09/2013   LDLCALC 138* 07/09/2013   TRIG 132 07/09/2013   CHOLHDL 6.3 07/09/2013   Lab Results  Component Value Date   TSH 3.174 07/09/2013   Glucose 98  ASSESSMENT/PLAN:  Routine general medical examination at a health care facility - Plan: POCT Urinalysis Dipstick  Osteopenia - discussed calcium, vitamin D and weight-bearing exercise.  Schedule DEXA f/u - Plan: DG Bone Density  Dyslipidemia - intolerant of statins.  low HDL with high chol/HDL ratio.  TG's now better; LDL borderline.  Start Niaspan--side effects/risks reviewed - Plan: niacin (NIASPAN) 500 MG CR tablet, Lipid panel  Insomnia - continue Ambien CR as needed  Gastroesophageal reflux disease without esophagitis - not having recurrent symptoms.  discussed importance of remaining on PPI daily due to Barrett's esophagus (as instructed per GI)  Barrett's esophagus - encouraged daily PPI  Need for shingles vaccine - risks/side effects and potential benefits (and limitations) reviewed in detail. no contraindications - Plan: Varicella-zoster vaccine subcutaneous  Encounter for long-term (current) use of other medications - Plan: Lipid panel, Hepatic function panel   Dyslipidemia-- Start niaspan 500 mg.  Discussed proper way to take.  Recheck in 6-8 weeks and titrate up, if needed.  Encouraged daily exercise and low cholesterol, lowfat diet  Urinary frequency/urgency--normal urine dip today.  Can restart ditropan, if needed, for ongoing symptoms.  Home sleep study--she turned in machine last week. No results back yet.  Await results.  Discussed monthly self breast exams and yearly mammogram; at least 30 minutes of aerobic activity at least 5 days/week; proper sunscreen use reviewed; healthy diet, including goals of calcium and vitamin D intake and alcohol  recommendations (less than or equal to 1 drink/day) reviewed; regular seatbelt use; changing batteries in smoke detectors. Immunization recommendations discussed--zostavax today (pt states she checked with her insurance and is covered); flu shot in the fall. Colonoscopy recommendations reviewed, per Dr. Ferdinand Lango (?due 2017 (5 yrs from last), but pt to check with him). Repeat DEXA recommended Stop tanning beds.  F/u 6-8 weeks for fasting labs and flu shot.

## 2013-10-01 NOTE — Patient Instructions (Signed)
  HEALTH MAINTENANCE RECOMMENDATIONS:  It is recommended that you get at least 30 minutes of aerobic exercise at least 5 days/week (for weight loss, you may need as much as 60-90 minutes). This can be any activity that gets your heart rate up. This can be divided in 10-15 minute intervals if needed, but try and build up your endurance at least once a week.  Weight bearing exercise is also recommended twice weekly.  Eat a healthy diet with lots of vegetables, fruits and fiber.  "Colorful" foods have a lot of vitamins (ie green vegetables, tomatoes, red peppers, etc).  Limit sweet tea, regular sodas and alcoholic beverages, all of which has a lot of calories and sugar.  Up to 1 alcoholic drink daily may be beneficial for women (unless trying to lose weight, watch sugars).  Drink a lot of water.  Calcium recommendations are 1200-1500 mg daily (1500 mg for postmenopausal women or women without ovaries), and vitamin D 1000 IU daily.  This should be obtained from diet and/or supplements (vitamins), and calcium should not be taken all at once, but in divided doses.  Monthly self breast exams and yearly mammograms for women over the age of 40 is recommended.  Sunscreen of at least SPF 30 should be used on all sun-exposed parts of the skin when outside between the hours of 10 am and 4 pm (not just when at beach or pool, but even with exercise, golf, tennis, and yard work!)  Use a sunscreen that says "broad spectrum" so it covers both UVA and UVB rays, and make sure to reapply every 1-2 hours.  Remember to change the batteries in your smoke detectors when changing your clock times in the spring and fall.  Use your seat belt every time you are in a car, and please drive safely and not be distracted with cell phones and texting while driving.  Please do not use tanning beds.  Start the niaspan--remember to take enteric coated 81mg  of aspirin about an hour beforehand, and take it with a lowfat snack such as  applesauce.  Take it in the evening, around bedtime.  Return in 6-8 weeks to have labs rechecked.  Ensure that you are still following a lowfat, low cholesterol diet.

## 2013-10-03 ENCOUNTER — Telehealth: Payer: Self-pay | Admitting: Family Medicine

## 2013-10-03 NOTE — Telephone Encounter (Signed)
If she takes the Nexium every day, she will be fine.  The nexium will protect her stomach from ulcers.  And she should be taking the nexium daily anyway, due to her Barrett's esophagus.

## 2013-10-03 NOTE — Telephone Encounter (Signed)
Spoke with patient and went over your instructions/recommendations in detail. She is worried about taking the aspirin, she states that she has had ulcers in the past-her stomach is now burning pretty badly and she is having diarrhea. She asked that I check with you first to see if it was still okay to try taking the two 81mg  coated aspirins 1 hour prior tonight. She did take with apple sauce as recommended. Just really worried about her side effect from aspirin.

## 2013-10-03 NOTE — Telephone Encounter (Signed)
This is the typical side effect that people can have--that can be minimized/prevented by taking it the right way.  Make sure to take aspirin prior (an hour if enteric coated, 30 minutes if not coated).  If 81mg  isn't strong enough to prevent the symptoms, Anna Mckinney can take two 81mg  tablets (or a 325mg , if needed).  Also to take with a lowfat snack such as apple sauce.  Did Anna Mckinney take it any differently the second day compared to the first?

## 2013-10-03 NOTE — Telephone Encounter (Signed)
Spoke with patient and she will try taking the two 81mg  asa tonight and see how that goes.

## 2013-10-03 NOTE — Telephone Encounter (Signed)
Pt states that she was given a rx for niaspan and the first night she took it everything was ok. But when she took last night she turned really red from top to bottom. She states she looked and felt like she had a really bad sunburn. She stated it did burn. Please call pt and advise. Pt can be reached at [phone]. [facility] is [facility] [address].

## 2013-10-05 ENCOUNTER — Telehealth: Payer: Self-pay | Admitting: Internal Medicine

## 2013-10-05 ENCOUNTER — Telehealth: Payer: Self-pay | Admitting: Medical

## 2013-10-05 NOTE — Telephone Encounter (Signed)
Pt states that she is having HA's, diarrhea, rash from head to toe and stomach pains from the 2 asa she has been taking and also the niacin. Pt states she is not taking the med anymore. I advised her that no provider was in the building and it would be Monday before someone would return her call that she just needed to stop taking med until we call her back to find out what she needed to do. Please advise pt

## 2013-10-05 NOTE — Telephone Encounter (Signed)
Sleep study showed mild sleep apnea.  She saw Dr. Tomi Bamberger for a physical recently and apparently the gasping episodes have resolved.  In regards to treating sleep apnea, recommendations include potentially raising the head of the bed, weight loss if overweight, potentially getting a mouth piece from dentist or oral surgeon, or pursuing CPAP machine.  If she is waking up feeling rested, if she is sleeping okay, not having any more gasping episodes, then we don't necessarily need to treat this with the CPAP machine given the mild findings.  However if she has not feeling rested or still having sleep issues, gasping spells, then we can move forward and have her set with a CPAP machine. See what she wants to do?

## 2013-10-05 NOTE — Telephone Encounter (Signed)
LMOM TO CB. CLS 

## 2013-10-05 NOTE — Telephone Encounter (Signed)
Patient is not having anymore gasping of her breath at night. CLS Patient states that she will try Dorothea Ogle PAc other recommendations before getting a CPAP machine. She will try elevating her bed first or the dentisit. CLS

## 2013-10-08 NOTE — Telephone Encounter (Signed)
Let's have her stop the niacin completely and have her set up a followup appointment with Dr. Tomi Bamberger

## 2013-10-08 NOTE — Telephone Encounter (Signed)
CALLED PATIENT INFORMED HER TO STOP NIACIN AND SET UP FOLLOW UP WITH DR.KNAPP PATIENT SAID SHE WOULD CALL AND MAKE THAT APPOINTMENT WHEN SHE GETS BACK FROM HER VACATION

## 2013-10-19 ENCOUNTER — Encounter: Payer: Self-pay | Admitting: Family Medicine

## 2013-10-23 ENCOUNTER — Telehealth: Payer: Self-pay | Admitting: Medical

## 2013-10-23 ENCOUNTER — Other Ambulatory Visit: Payer: Self-pay

## 2013-10-23 DIAGNOSIS — Z1231 Encounter for screening mammogram for malignant neoplasm of breast: Secondary | ICD-10-CM

## 2013-10-23 NOTE — Telephone Encounter (Signed)
Pt called and stated that she received a letter stating that her insurance was requesting additional information before they would pay for sleep study she recently had. She states that we should have received a letter too. Please check folders to see if received and complete ASAP. If not received then we should be receiving any day.l

## 2013-10-24 NOTE — Telephone Encounter (Signed)
Not sure, but we had valid reasons for a sleep study - gasping for air, paroxsymal nocturnal dyspnea, fatigue, non restorative sleep, etc.

## 2013-10-31 ENCOUNTER — Ambulatory Visit
Admission: RE | Admit: 2013-10-31 | Discharge: 2013-10-31 | Disposition: A | Discharge status: Home / Self Care | Payer: BC Managed Care – PPO | Source: Ambulatory Visit

## 2013-10-31 ENCOUNTER — Ambulatory Visit: Payer: BC Managed Care – PPO

## 2013-10-31 DIAGNOSIS — Z1231 Encounter for screening mammogram for malignant neoplasm of breast: Secondary | ICD-10-CM

## 2013-12-21 ENCOUNTER — Other Ambulatory Visit: Payer: Self-pay

## 2014-01-03 ENCOUNTER — Ambulatory Visit (INDEPENDENT_AMBULATORY_CARE_PROVIDER_SITE_OTHER): Payer: BC Managed Care – PPO | Admitting: Family Medicine

## 2014-01-03 ENCOUNTER — Encounter: Payer: Self-pay | Admitting: Family Medicine

## 2014-01-03 VITALS — BP 126/78 | HR 84 | Temp 98.4°F | Ht 63.5 in | Wt 148.0 lb

## 2014-01-03 DIAGNOSIS — J029 Acute pharyngitis, unspecified: Secondary | ICD-10-CM

## 2014-01-03 DIAGNOSIS — J069 Acute upper respiratory infection, unspecified: Secondary | ICD-10-CM

## 2014-01-03 DIAGNOSIS — Z23 Encounter for immunization: Secondary | ICD-10-CM

## 2014-01-03 LAB — POCT RAPID STREP A (OFFICE): Rapid Strep A Screen: NEGATIVE

## 2014-01-03 NOTE — Patient Instructions (Signed)
  Drink plenty of fluids.  Consider nasal saline spray vs sinus rinses if you are having sinus pain. If needed, you can also use sudafed (decongestant) to help with congestion and sinus pain. Consider using mucinex to break up any thick phlegm. Use tylenol as needed for pain/discomfort/fever.  Call or return if you have ongoing/worsening symptoms including sinus pain, fever, discolored mucus (symptoms lasting more than a week, and getting worse).

## 2014-01-03 NOTE — Progress Notes (Signed)
Chief Complaint  Patient presents with  . Sore Throat    swollen glands x couple of days. No fevers. (strep negative)   2-3 days ago she started with a soreness on the right side of her neck; feels like her glands are swollen.  It was puffier earlier, but has improved (someone asked her if she was stung by something, it was so swollen).  She is also having some nasal congestion, stuffiness.  She has very slight sinus pressure on both cheeks.  Not getting much out of her nose when she blows--nose is mainly dry.  +PND, unable to expectorate.  She has a slight cough.  Denies any shortness of breath or wheezing.  No known fevers.  No sick contacts.  She hasn't used any OTC meds other than some tylenol for the achiness (none today).  Outpatient Encounter Prescriptions as of 01/03/2014  Medication Sig Note  . acidophilus (RISAQUAD) CAPS capsule Take 1 capsule by mouth daily.   . Cholecalciferol (VITAMIN D3) 5000 UNITS TABS Take 1 tablet by mouth daily.   [name] esomeprazole (NEXIUM) 40 MG capsule Take 40 mg by mouth. IN AM 01/03/2014: 3 times a week  . vitamin C (ASCORBIC ACID) 500 MG tablet Take 500 mg by mouth 2 (two) times daily.   [name] zolpidem (AMBIEN CR) 12.5 MG CR tablet take 1 tablet by mouth once daily 01/03/2014: Uses prn  . niacin (NIASPAN) 500 MG CR tablet Take 1 tablet by mouth at bedtime with lowfat snack (ie applesauce).  Take coated baby aspirin 1 hour prior 01/03/2014: Stopped because of itching and headaches. It would wake her up from sleep.  The aspirin bothered her stomach  . [DISCONTINUED] oxybutynin (DITROPAN) 5 MG tablet Take 5 mg by mouth daily.  10/01/2013: Prescribed by urgent care last week; not taking now   Allergies  Allergen Reactions  . Hydrocodone Itching and Nausea And Vomiting  . Codeine   . Doxycycline Other (See Comments)    Stomach pain     She was unable to tolerate doxycyline--caused abdominal pain (rx'd by Dr. Lucita Ferrara for eye infection.  She is still using  eye drops that were prescribed).  She didn't tolerate niaspan--aspirin caused stomach pain, and she woke up itching  ROS:  Denies fevers, chills, chest pain, shortness of breath, nausea, vomiting, diarrhea, rash, bleeding, bruising, mood changes. +intermittent insomnia. See HPI  PHYSICAL EXAM: BP 126/78  Pulse 84  Temp(Src) 98.4 F (36.9 C) (Tympanic)  Ht 5' 3.5" (1.613 m)  Wt 148 lb (67.132 kg)  BMI 25.80 kg/m2  Frequent throat-clearing and some sniffles.  No distress. HEENT: PERRL, EOMI, conjunctiva clear.  TM's and EAC's normal.  Nasal mucosa is moderately to severely edematous, R>L.no erythema.  Some white mucus noted bilaterally.  Sinuses are minimally tender over maxillary sinuses, nontender elsewhere.  OP is clear without erythema, exudates, ulcers or other lesions. Neck: some shotty, mildly tender lymphadenopathy bilaterally (tender mainly only on the right) Heart: regular rate and rhythm, without murmur Lungs: clear bilaterally Skin: no rash  Rapid strep negative  ASSESSMENT/PLAN:  Acute upper respiratory infection  Sore throat - Plan: Rapid Strep A  Need for prophylactic vaccination and inoculation against influenza - Plan: Flu Vaccine QUAD 36+ mos PF IM (Fluarix Quad PF)   Drink plenty of fluids.  Consider nasal saline spray vs sinus rinses if you are having sinus pain. If needed, you can also use sudafed (decongestant) to help with congestion and sinus pain. Consider using mucinex to break  up any thick phlegm. Use tylenol as needed for pain/discomfort/fever.  Call or return if you have ongoing/worsening symptoms including sinus pain, fever, discolored mucus (symptoms lasting more than a week, and getting worse).  Will need to arrange for f/u on her lipids, since she couldn't tolerate Niaspan, and intolerant of statins.

## 2014-01-07 ENCOUNTER — Other Ambulatory Visit: Payer: Self-pay | Admitting: *Deleted

## 2014-01-07 ENCOUNTER — Encounter: Payer: Self-pay | Admitting: Family Medicine

## 2014-01-09 ENCOUNTER — Other Ambulatory Visit: Payer: BC Managed Care – PPO

## 2014-01-09 DIAGNOSIS — Z79899 Other long term (current) drug therapy: Secondary | ICD-10-CM

## 2014-01-09 DIAGNOSIS — E785 Hyperlipidemia, unspecified: Secondary | ICD-10-CM

## 2014-01-10 ENCOUNTER — Other Ambulatory Visit: Payer: BC Managed Care – PPO

## 2014-01-10 LAB — LIPID PANEL
CHOL/HDL RATIO: 5.8 ratio
CHOLESTEROL: 197 mg/dL (ref 0–200)
HDL: 34 mg/dL — ABNORMAL LOW (ref >39)
LDL CALC: 125 mg/dL — AB (ref 0–99)
Triglycerides: 189 mg/dL — ABNORMAL HIGH (ref <150)
VLDL: 38 mg/dL (ref 0–40)

## 2014-01-29 ENCOUNTER — Telehealth: Payer: Self-pay | Admitting: Internal Medicine

## 2014-01-29 DIAGNOSIS — Z5181 Encounter for therapeutic drug level monitoring: Secondary | ICD-10-CM

## 2014-01-29 DIAGNOSIS — E785 Hyperlipidemia, unspecified: Secondary | ICD-10-CM

## 2014-01-29 MED ORDER — CHOLESTYRAMINE LIGHT 4 G PO PACK: 4.0000 g | PACK | Freq: Two times a day (BID) | ORAL | Status: DC

## 2014-01-29 NOTE — Telephone Encounter (Signed)
Pt notified. Pt will call back later on to schedule her lab appt. Future orders in system.

## 2014-01-29 NOTE — Telephone Encounter (Signed)
Pt states she did not hear anything back on her labs and i told her it was sent through her mychart and she said she had stuff done on her eyes and wasn't able to see well right now so I read her what you wrote. Pt states that her friend uses cholestyramine oral USP/light sugar free orange favor packets that she puts in her water at night for her cholesterol and would like to try this. She does not want to try any meds until she tries the stuff you put in your drink first. Also she states that Dr. Morrison Old told her to not try any oil meds like fish oil as it messes up her esophagus issues. So she will not take any fish oil. She has tried lipitor,crestor and had side effects so she wants to try the med that her friend takes to see if it works. Please send in med to [facility] [address]

## 2014-01-29 NOTE — Telephone Encounter (Signed)
Advise pt that per her request, packets sent to pharmacy.  To take twice daily, before meals. Needs to have lipids and LFT's checked in 3 months (before running out).  This med helps lower LDL, doesn't really affect TG or HDL, but am happy to give it a try.  Please schedule for labs and enter future orders (med monitoring, dyslipidemia)

## 2014-03-13 ENCOUNTER — Telehealth: Payer: Self-pay | Admitting: Family Medicine

## 2014-03-13 MED ORDER — ZOLPIDEM TARTRATE ER 12.5 MG PO TBCR: EXTENDED_RELEASE_TABLET | ORAL | Status: DC

## 2014-03-13 NOTE — Telephone Encounter (Signed)
Pt called and request refill for Ambien to [facility]

## 2014-03-13 NOTE — Telephone Encounter (Signed)
Ok to refill #30 with 2 refills. It should be the CR 12.5

## 2014-03-13 NOTE — Telephone Encounter (Signed)
Done

## 2014-06-03 ENCOUNTER — Telehealth: Payer: Self-pay | Admitting: Family Medicine

## 2014-06-03 NOTE — Telephone Encounter (Signed)
Returned call to pt 2 times no answer and no answer machine.

## 2014-06-20 ENCOUNTER — Telehealth: Payer: Self-pay | Admitting: Family Medicine

## 2014-06-21 NOTE — Telephone Encounter (Signed)
P.A Zolpidem tartrate ER approved til 06/20/15, pt informed, faxed pharmacy

## 2014-08-06 ENCOUNTER — Other Ambulatory Visit: Payer: Self-pay

## 2014-08-06 DIAGNOSIS — Z1231 Encounter for screening mammogram for malignant neoplasm of breast: Secondary | ICD-10-CM

## 2014-09-25 ENCOUNTER — Other Ambulatory Visit: Payer: Self-pay | Admitting: *Deleted

## 2014-10-03 ENCOUNTER — Encounter: Payer: Self-pay | Admitting: Family Medicine

## 2014-11-04 ENCOUNTER — Ambulatory Visit
Admission: RE | Admit: 2014-11-04 | Discharge: 2014-11-04 | Disposition: A | Discharge status: Home / Self Care | Payer: 59 | Source: Ambulatory Visit

## 2014-11-04 DIAGNOSIS — Z1231 Encounter for screening mammogram for malignant neoplasm of breast: Secondary | ICD-10-CM

## 2014-11-18 ENCOUNTER — Encounter: Payer: Self-pay | Admitting: Family Medicine

## 2014-11-18 ENCOUNTER — Ambulatory Visit (INDEPENDENT_AMBULATORY_CARE_PROVIDER_SITE_OTHER): Payer: 59 | Admitting: Family Medicine

## 2014-11-18 VITALS — BP 132/70 | HR 60 | Ht 63.5 in | Wt 148.2 lb

## 2014-11-18 DIAGNOSIS — K219 Gastro-esophageal reflux disease without esophagitis: Secondary | ICD-10-CM | POA: Diagnosis not present

## 2014-11-18 DIAGNOSIS — Z23 Encounter for immunization: Secondary | ICD-10-CM | POA: Diagnosis not present

## 2014-11-18 DIAGNOSIS — K2270 Barrett's esophagus without dysplasia: Secondary | ICD-10-CM | POA: Diagnosis not present

## 2014-11-18 DIAGNOSIS — E785 Hyperlipidemia, unspecified: Secondary | ICD-10-CM | POA: Diagnosis not present

## 2014-11-18 DIAGNOSIS — Z0000 Encounter for general adult medical examination without abnormal findings: Secondary | ICD-10-CM | POA: Diagnosis not present

## 2014-11-18 DIAGNOSIS — Z5181 Encounter for therapeutic drug level monitoring: Secondary | ICD-10-CM | POA: Diagnosis not present

## 2014-11-18 DIAGNOSIS — R635 Abnormal weight gain: Secondary | ICD-10-CM

## 2014-11-18 DIAGNOSIS — Z800 Family history of malignant neoplasm of digestive organs: Secondary | ICD-10-CM | POA: Diagnosis not present

## 2014-11-18 DIAGNOSIS — G4700 Insomnia, unspecified: Secondary | ICD-10-CM | POA: Diagnosis not present

## 2014-11-18 DIAGNOSIS — Z860100 Personal history of colon polyps, unspecified: Secondary | ICD-10-CM

## 2014-11-18 DIAGNOSIS — M8580 Other specified disorders of bone density and structure, unspecified site: Secondary | ICD-10-CM | POA: Diagnosis not present

## 2014-11-18 LAB — COMPREHENSIVE METABOLIC PANEL
ALT: 16 U/L (ref 6–29)
AST: 21 U/L (ref 10–35)
Albumin: 4.2 g/dL (ref 3.6–5.1)
Alkaline Phosphatase: 75 U/L (ref 33–130)
BUN: 12 mg/dL (ref 7–25)
CO2: 25 mmol/L (ref 20–31)
Calcium: 10 mg/dL (ref 8.6–10.4)
Chloride: 103 mmol/L (ref 98–110)
Creat: 0.72 mg/dL (ref 0.50–0.99)
GLUCOSE: 103 mg/dL — AB (ref 65–99)
POTASSIUM: 3.9 mmol/L (ref 3.5–5.3)
Sodium: 137 mmol/L (ref 135–146)
Total Bilirubin: 0.7 mg/dL (ref 0.2–1.2)
Total Protein: 6.7 g/dL (ref 6.1–8.1)

## 2014-11-18 LAB — POCT URINALYSIS DIPSTICK
Bilirubin, UA: NEGATIVE
Glucose, UA: NEGATIVE
KETONES UA: NEGATIVE
LEUKOCYTES UA: NEGATIVE
Nitrite, UA: NEGATIVE
PH UA: 5
PROTEIN UA: NEGATIVE
RBC UA: NEGATIVE
Urobilinogen, UA: 0.2

## 2014-11-18 LAB — CBC WITH DIFFERENTIAL/PLATELET
BASOS PCT: 0 % (ref 0–1)
Basophils Absolute: 0 10*3/uL (ref 0.0–0.1)
Eosinophils Absolute: 0.1 10*3/uL (ref 0.0–0.7)
Eosinophils Relative: 1 % (ref 0–5)
HEMATOCRIT: 42.5 % (ref 36.0–46.0)
HEMOGLOBIN: 15 g/dL (ref 12.0–15.0)
LYMPHS ABS: 2.1 10*3/uL (ref 0.7–4.0)
Lymphocytes Relative: 40 % (ref 12–46)
MCH: 29.6 pg (ref 26.0–34.0)
MCHC: 35.3 g/dL (ref 30.0–36.0)
MCV: 83.8 fL (ref 78.0–100.0)
MONOS PCT: 11 % (ref 3–12)
MPV: 10.4 fL (ref 8.6–12.4)
Monocytes Absolute: 0.6 10*3/uL (ref 0.1–1.0)
NEUTROS ABS: 2.5 10*3/uL (ref 1.7–7.7)
Neutrophils Relative %: 48 % (ref 43–77)
Platelets: 229 10*3/uL (ref 150–400)
RBC: 5.07 MIL/uL (ref 3.87–5.11)
RDW: 14 % (ref 11.5–15.5)
WBC: 5.3 10*3/uL (ref 4.0–10.5)

## 2014-11-18 LAB — LIPID PANEL
CHOLESTEROL: 219 mg/dL — AB (ref 125–200)
HDL: 34 mg/dL — ABNORMAL LOW (ref >=46)
LDL CALC: 156 mg/dL — AB (ref <130)
TRIGLYCERIDES: 146 mg/dL (ref <150)
Total CHOL/HDL Ratio: 6.4 Ratio — ABNORMAL HIGH (ref <=5.0)
VLDL: 29 mg/dL (ref <30)

## 2014-11-18 LAB — TSH: TSH: 1.155 u[IU]/mL (ref 0.350–4.500)

## 2014-11-18 NOTE — Progress Notes (Signed)
Chief Complaint  Patient presents with  . Annual Exam    fasting annual exam with pelvic. Did not do eye exam, she had cataract surgery 3 mo ago and is going for follow up eye exam in the next couple weeks. No concerns.     Anna Mckinney is a 64 y.o. female who presents for a complete physical and f/u on her chronic medical issues.  Insomnia--She states she has been out of the Ambien CR for quite a while, she thinks a year. Per computer it was last phoned in 03/2014 for #30 with 2 refills. She thinks she had trouble with her insurance at that time, doesn't recall if she ever got it filled.  She hasn't had any, so hasn't been taking.  She has trouble sleeping, and would like some.  Denies side effects.   Dyslipidemia: Niaspan was started at her physical last July (2015). She didn't tolerate Niaspan due to flushing and itching.  The 36m ECASA "tore up" her stomach, so it was stopped. We changed it to cholestyramine, but she couldn't take that--felt like she was swallowing sand, hard to swallow and choked her.  She tries to follow a lowfat, low cholesterol diet. Creamy [employer] dressing only once a month. Only exercise is related to working with her daughter. She wants to get back to her Zumba classes, hasn't been going.  She has been very worried and focused on concerns about her daughter's weight loss.  H/o Barrett's esophagus.  [facility]  She wasn't particularly impressed with him, and is happy to change to [place] (her daughter saw Dr. GCarlean Purland they like him).  She knows it has been at least 2 years since her last endoscopy. Denies any dysphagia. She hasn't been taking Nexium, due to concerns about it "making her bones soft". She uses it just prn, only about once or twice a month.  Osteopenia: she never went for the follow up DEXA that was ordered for her at last year's physical.  She takes calcium with D just once daily. Not much regular milk; 1 cup of  yogurt daily.  Mild persistent cough--This is significantly improved, only every once in a while does she cough.  She previously saw pulmonary in the (Dr. WMelvyn Novas who told her to avoid fish oil. She was told it was related to her GERD, and nothing was wrong with her lungs.  Immunization History  Administered Date(s) Administered  . Influenza Split 01/12/2011, 12/17/2011  . Influenza Whole 01/06/2007, 11/27/2007, 11/27/2009  . Influenza,inj,Quad PF,36+ Mos 12/14/2012, 01/03/2014  . Pneumococcal Conjugate-13 07/09/2005  . Td 07/09/2005  . Tdap 07/29/2011  . Zoster 10/01/2013   Last pap 07/2010, s/p hysterectomy. She had h/o abnormal pap, conization, but told they "didn't get it all", still had abnormal cells. Hysterectomy was at age 64 No abnormal paps since then. [facility]ammogram 10/2014 [facility]. She is unsure when she was told she was due again. EGD unsure of last date, [facility] (osteopenia)  Sees dentist and ophtho regularly Still going to tanning beds, as well as tanning outside. Exercise: Housework, and caring for SFreda Munro She used to do Zumba (not in over a year).  Past Medical History  Diagnosis Date  . Barrett's esophagus      [place] (Dr. PFerdinand Lango  . Osteopenia   . Internal prolapsed hemorrhoids s/p THD hemorrhoidal ligation/pexy 10/16/2012  . PONV (  postoperative nausea and vomiting)     PT'S HEART RATE DOWN TO 40 WITH HER LAST SURGERY - SHE REMEMBERS BEING ASKED TO WAKE UP SO HEART RATE WOULD GO UP - SCARED HER.  [name] GERD (gastroesophageal reflux disease)   . Insomnia   . Sleep apnea 2015    mild    Past Surgical History  Procedure Laterality Date  . Cholecystectomy  2010  . Abdominal hysterectomy  1984    1 ovary remains; removed for "cancer cells"  . Transanal hemorrhoidal dearterialization  10/20/2012    hemorrhoidal ligation and pexy.  [name] Posterolateral external hemorrhoidactomy   10/20/2012    left  . Evaluation under anesthesia with hemorrhoidectomy N/A 01/25/2013    Procedure: EXAM UNDER ANESTHESIA WITH HEMORRHOIDECTOMY;  Surgeon: Adin Hector, MD;  Location: [facility];  Service: General;  Laterality: N/A;  . Cataract extraction Bilateral end of 2015 & early 2016    Dr. Lucita Ferrara    Social History   Social History  . Marital Status: Married    Spouse Name: N/A  . Number of Children: 2  . Years of Education: N/A   Occupational History  . 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 Smoking status: Former Smoker -- 0.30 packs/day for 20 years    Types: Cigarettes    Quit date: 01/24/2009  . Smokeless tobacco: Never Used  . Alcohol Use: No  . Drug Use: No  . Sexual Activity: Not on file   Other Topics Concern  . Not on file   Social History Narrative   Lives with husband, daughter (with Angelman [place] is her caregiver) and 1 dog (boxer)    Family History  Problem Relation Age of Onset  . Cancer Mother 24    breast cancer  . COPD Father   . Heart disease Father     CHF  . Atrial fibrillation Father   . Angelman syndrome Daughter   . Seizures Daughter   . Cancer Paternal Grandmother     stomach  . Thyroid disease Sister   . Diabetes Sister     diet controlled  . Arthritis Sister     rheumatoid  . Hyperlipidemia Brother   . Colon cancer Sister 72  . Cancer Sister     colon    Outpatient Encounter Prescriptions as of 11/18/2014  Medication Sig Note  . acidophilus (RISAQUAD) CAPS capsule Take 1 capsule by mouth daily.   . Cholecalciferol (VITAMIN D3) 5000 UNITS TABS Take 1 tablet by mouth daily.   [name] esomeprazole (NEXIUM) 40 MG capsule Take 40 mg by mouth. IN AM 11/18/2014: Using it prn, only once or twice a month  . vitamin C (ASCORBIC ACID) 500 MG tablet Take 500 mg by mouth 2 (two) times daily.   . [DISCONTINUED] esomeprazole (NEXIUM) 20 MG capsule Take 20 mg by mouth. 11/18/2014: Received from: [place]   . cholestyramine light (PREVALITE) 4 G packet Take 1 packet (4 g total) by mouth 2 (two) times daily. (Patient not taking: Reported on 11/18/2014) 11/18/2014: Didn't tolerate--had a hard time swallowing it (like drinking sand)  . niacin (NIASPAN) 500 MG CR tablet Take 1 tablet by mouth at bedtime with lowfat snack (ie applesauce).  Take coated baby aspirin 1 hour prior (Patient not taking: Reported on 11/18/2014) 01/03/2014: Stopped because of itching and headaches. It would wake her up from sleep.  The aspirin bothered her stomach  . zolpidem (AMBIEN CR) 12.5 MG CR tablet take  1 tablet by mouth once daily (Patient not taking: Reported on 11/18/2014) 11/18/2014: Not taking it only because she didn't have any.    No facility-administered encounter medications on file as of 11/18/2014.    Allergies  Allergen Reactions  . Hydrocodone Itching and Nausea And Vomiting  . Codeine   . Doxycycline Other (See Comments)    Stomach pain    Review of Systems  The patient denies anorexia, fever, denies headaches, vision changes, decreased hearing, ear pain, breast concerns, chest pain, palpitations, dizziness, syncope, dyspnea on exertion, nausea, vomiting, heartburn, dysphagia, constipation, abdominal pain, melena, no vaginal bleeding, vaginal discharge, odor or itch, genital lesions, numbness, tingling, weakness, tremor, suspicious skin lesions, depression, anxiety, abnormal bleeding/bruising, or enlarged lymph nodes. +thoracic back pain, much less than in the past ,relieved by stretching. 5# weight gain in the last year. Occasional dry cough--much less often.   PHYSICAL EXAM:  BP 142/86 mmHg  Pulse 60  Ht 5' 3.5" (1.613 m)  Wt 148 lb 3.2 oz (67.223 kg)  BMI 25.84 kg/m2 132/70 on repeat by MD  General Appearance:  Alert, cooperative, no distress, appears stated age; occasional throat-clearing, no cough   Head:  Normocephalic, without obvious abnormality, atraumatic   Eyes:  PERRL,  conjunctiva/corneas clear, EOM's intact, fundi  benign   Ears:  Normal TM's and external ear canals   Nose:  Nares normal   Throat:  Lips, mucosa, and tongue normal; teeth and gums normal   Neck:  Supple, no lymphadenopathy; thyroid: no enlargement/tenderness/nodules; no carotid  bruit or JVD   Back:  Spine nontender, no curvature, ROM normal, no CVA tenderness   Lungs:  Clear to auscultation bilaterally without wheezes, rales or ronchi; respirations unlabored   Chest Wall:  No tenderness or deformity   Heart:  Regular rate and rhythm, S1 and S2 normal, no murmur, rub  or gallop   Breast Exam:  No tenderness, masses, or nipple discharge or inversion. No axillary lymphadenopathy.   Abdomen:  Soft, non-tender, nondistended, normoactive bowel sounds,  no masses, no hepatosplenomegaly   Genitalia:  Normal external genitalia. There are some inclusion cysts noted, only one of which appears inflamed in the suprapubic area on the right.  BUS and vagina normal; No abnormal vaginal discharge. Uterus is absent. No adnexal masses, R adnexa palpable, nontender, not enlarged. Pap not performed  Rectal:  Normal tone, guaiac negative stool, no masses. External hemorrhoidal tags only  Extremities:  No clubbing, cyanosis or edema   Pulses:  2+ and symmetric all extremities   Skin:  Skin color, texture, turgor normal, no rashes or lesions. Very tanned, with a few small areas with mild sunburn  Lymph nodes:  Cervical, supraclavicular, and axillary nodes normal   Neurologic:  CNII-XII intact, normal strength, sensation and gait; reflexes 2+ and symmetric throughout    Psych: Normal mood, affect, hygiene and grooming.         ASSESSMENT/PLAN:  Annual physical exam - Plan: POCT Urinalysis Dipstick  Osteopenia - getting inadequate Ca++ intake; discussed recs for Ca, Vit D, weight-bearing exercise. Schedule f/u DEXA - Plan: DG Bone  Density  Gastroesophageal reflux disease, esophagitis presence not specified - some throat-clearing and cough, no heartburn. pt stopped PPI, just using infrequently, prn. - Plan: Ambulatory referral to Gastroenterology  Barrett's esophagus - discussed risks of Barrett's/untreated GERD vs risks of PPI. refer to GI, likely needs EGD - Plan: Ambulatory referral to Gastroenterology  Dyslipidemia - reivewed proper diet. Intolerant of Niaspan and cholestyramine -  Plan: Lipid panel, Comprehensive metabolic panel  Insomnia - encouraged regular exercise, behavioral measures. If she didn't fill last rx, should be at pharmacy still  Medication monitoring encounter - Plan: Comprehensive metabolic panel, CBC with Differential/Platelet  Weight gain - encouraged regular exercise, and healthy diet, portion sizes - Plan: TSH  Family history of colon cancer - Plan: Ambulatory referral to Gastroenterology  Hx of colonic polyps - Plan: Ambulatory referral to Gastroenterology  Need for influenza vaccination - Plan: Flu Vaccine QUAD 36+ mos PF IM (Fluarix & Fluzone Quad PF)   Discussed monthly self breast exams and yearly mammogram; at least 30 minutes of aerobic activity at least 5 days/week, weight-bearing exercise at least 2x/week; proper sunscreen use reviewed, along with risks of tanning beds; healthy diet, including goals of calcium and vitamin D intake and alcohol recommendations (less than or equal to 1 drink/day) reviewed; regular seatbelt use; changing batteries in smoke detectors. Immunization recommendations discussed--flu shot today. Colonoscopy recommendations reviewed, per Dr. Ferdinand Lango (? Likely due 2017 (5 yrs from last), but pt to check records). Repeat DEXA recommended--ordered again Again counseled re: risks of sun and tanning beds Counseled re: risks of Barrett's esophagus.  Likely due for EGD. Discussed risks/benefits of Nexium--encouraged use, along with adequate calcium intake (needs to  increase--take pills BId or increase oral intake).   c-met, lipid, TSH, CBC  F/u 1 year, sooner prn.

## 2014-11-18 NOTE — Patient Instructions (Signed)
  HEALTH MAINTENANCE RECOMMENDATIONS:  It is recommended that you get at least 30 minutes of aerobic exercise at least 5 days/week (for weight loss, you may need as much as 60-90 minutes). This can be any activity that gets your heart rate up. This can be divided in 10-15 minute intervals if needed, but try and build up your endurance at least once a week.  Weight bearing exercise is also recommended twice weekly.  Eat a healthy diet with lots of vegetables, fruits and fiber.  "Colorful" foods have a lot of vitamins (ie green vegetables, tomatoes, red peppers, etc).  Limit sweet tea, regular sodas and alcoholic beverages, all of which has a lot of calories and sugar.  Up to 1 alcoholic drink daily may be beneficial for women (unless trying to lose weight, watch sugars).  Drink a lot of water.  Calcium recommendations are 1200-1500 mg daily (1500 mg for postmenopausal women or women without ovaries), and vitamin D 1000 IU daily.  This should be obtained from diet and/or supplements (vitamins), and calcium should not be taken all at once, but in divided doses.  Monthly self breast exams and yearly mammograms for women over the age of 27 is recommended.  Sunscreen of at least SPF 30 should be used on all sun-exposed parts of the skin when outside between the hours of 10 am and 4 pm (not just when at beach or pool, but even with exercise, golf, tennis, and yard work!)  Use a sunscreen that says "broad spectrum" so it covers both UVA and UVB rays, and make sure to reapply every 1-2 hours.  Remember to change the batteries in your smoke detectors when changing your clock times in the spring and fall.  Use your seat belt every time you are in a car, and please drive safely and not be distracted with cell phones and texting while driving.   Please stop using tanning beds, and use appropriate sunscreen (see above) when outside.  Please make sure to [facility] (call  them if they don't call you within the week to schedule).  We are referring you to Dr. Carlean Purl for f/u on Barrett's.  You will need to get your records from Dr. Ferdinand Lango sent to their office.

## 2014-11-20 ENCOUNTER — Other Ambulatory Visit: Payer: Self-pay | Admitting: *Deleted

## 2014-11-20 MED ORDER — PRAVASTATIN SODIUM 40 MG PO TABS: 40.0000 mg | ORAL_TABLET | Freq: Every day | ORAL | Status: DC

## 2014-11-22 ENCOUNTER — Encounter: Payer: Self-pay | Admitting: Internal Medicine

## 2015-01-13 ENCOUNTER — Encounter: Payer: Self-pay | Admitting: Family Medicine

## 2015-01-13 ENCOUNTER — Ambulatory Visit (INDEPENDENT_AMBULATORY_CARE_PROVIDER_SITE_OTHER): Payer: 59 | Admitting: Family Medicine

## 2015-01-13 VITALS — BP 112/50 | HR 60 | Ht 63.5 in | Wt 149.0 lb

## 2015-01-13 DIAGNOSIS — M7752 Other enthesopathy of left foot: Secondary | ICD-10-CM

## 2015-01-13 DIAGNOSIS — M722 Plantar fascial fibromatosis: Secondary | ICD-10-CM | POA: Diagnosis not present

## 2015-01-13 NOTE — Patient Instructions (Addendum)
Plantar Fasciitis Plantar fasciitis is a painful foot condition that affects the heel. It occurs when the band of tissue that connects the toes to the heel bone (plantar fascia) becomes irritated. This can happen after exercising too much or doing other repetitive activities (overuse injury). The pain from plantar fasciitis can range from mild irritation to severe pain that makes it difficult for you to walk or move. The pain is usually worse in the morning or after you have been sitting or lying down for a while. CAUSES This condition may be caused by:  Standing for long periods of time.  Wearing shoes that do not fit.  Doing high-impact activities, including running, aerobics, and ballet.  Being overweight.  Having an abnormal way of walking (gait).  Having tight calf muscles.  Having high arches in your feet.  Starting a new athletic activity. SYMPTOMS The main symptom of this condition is heel pain. Other symptoms include:  Pain that gets worse after activity or exercise.  Pain that is worse in the morning or after resting.  Pain that goes away after you walk for a few minutes. DIAGNOSIS This condition may be diagnosed based on your signs and symptoms. Your health care provider will also do a physical exam to check for:  A tender area on the bottom of your foot.  A high arch in your foot.  Pain when you move your foot.  Difficulty moving your foot. You may also need to have imaging studies to confirm the diagnosis. These can include:  X-rays.  Ultrasound.  MRI. TREATMENT  Treatment for plantar fasciitis depends on the severity of the condition. Your treatment may include:  Rest, ice, and over-the-counter pain medicines to manage your pain.  Exercises to stretch your calves and your plantar fascia.  A splint that holds your foot in a stretched, upward position while you sleep (night splint).  Physical therapy to relieve symptoms and prevent problems in the  future.  Cortisone injections to relieve severe pain.  Extracorporeal shock wave therapy (ESWT) to stimulate damaged plantar fascia with electrical impulses. It is often used as a last resort before surgery.  Surgery, if other treatments have not worked after 12 months. HOME CARE INSTRUCTIONS  Take medicines only as directed by your health care provider.  Avoid activities that cause pain.  Roll the bottom of your foot over a bag of ice or a bottle of cold water. Do this for 20 minutes, 3-4 times a day.  Perform simple stretches as directed by your health care provider.  Try wearing athletic shoes with air-sole or gel-sole cushions or soft shoe inserts.  Wear a night splint while sleeping, if directed by your health care provider.  Keep all follow-up appointments with your health care provider. PREVENTION   Do not perform exercises or activities that cause heel pain.  Consider finding low-impact activities if you continue to have problems.  Lose weight if you need to. The best way to prevent plantar fasciitis is to avoid the activities that aggravate your plantar fascia. SEEK MEDICAL CARE IF:  Your symptoms do not go away after treatment with home care measures.  Your pain gets worse.  Your pain affects your ability to move or do your daily activities.   This information is not intended to replace advice given to you by your health care provider. Make sure you discuss any questions you have with your health care provider.   Document Released: 11/17/2000 Document Revised: 11/13/2014 Document Reviewed: 01/02/2014 Elsevier   Interactive Patient Education [employer]. Two Aleve twice a day .heel cups. Heat for 20 minutes 2 or 3 times per day

## 2015-01-13 NOTE — Progress Notes (Signed)
   Subjective:    Patient ID: Anna Mckinney, female    DOB: Jun 04, 1950, 64 y.o.   MRN: [id number]  HPI He complains of a two-week history of left heel pain as well as in the retrocalcaneal area. No history of injury or overuse. The pain in her heel is worse when she gets up in the morning and also she sits for long period time and gets up.   Review of Systems     Objective:   Physical Exam left retrocalcaneal area slightly full and minimally tender. She is also tender over the calcaneal spur. Full motion of the ankle.      Assessment & Plan:  Plantar fasciitis of left foot  Retrocalcaneal bursitis, left  information given concerning plantar fascitis. Also recommend to leave twice per day as well as heat and stretching and use of heel cups. Also mentioned using arch supports of the heel cups don't work.

## 2015-01-28 ENCOUNTER — Telehealth: Payer: Self-pay | Admitting: Internal Medicine

## 2015-01-28 ENCOUNTER — Ambulatory Visit (INDEPENDENT_AMBULATORY_CARE_PROVIDER_SITE_OTHER): Payer: 59 | Admitting: Internal Medicine

## 2015-01-28 ENCOUNTER — Encounter: Payer: Self-pay | Admitting: Internal Medicine

## 2015-01-28 VITALS — BP 108/70 | HR 64 | Ht 64.0 in | Wt 148.0 lb

## 2015-01-28 DIAGNOSIS — Z860100 Personal history of colon polyps, unspecified: Secondary | ICD-10-CM | POA: Insufficient documentation

## 2015-01-28 DIAGNOSIS — K2270 Barrett's esophagus without dysplasia: Secondary | ICD-10-CM | POA: Diagnosis not present

## 2015-01-28 NOTE — Patient Instructions (Addendum)
  You have been scheduled for an endoscopy and colonoscopy. Please follow the written instructions given to you at your visit today. Please pick up your over the counter prep supplies at the pharmacy. If you use inhalers (even only as needed), please bring them with you on the day of your procedure.   I appreciate the opportunity to care for you. Silvano Rusk, MD, [facility]

## 2015-01-28 NOTE — Progress Notes (Signed)
   Subjective:    Patient ID: Anna Mckinney, female    DOB: 1950/05/15, 64 y.o.   MRN: [id number] Cc: hx colon polyps, Barrett's esophagus  HPI Very nice larried ww here for follow-up of colon polyps and barrett's esophagus dx by Dr. Virgel Bouquet. She does not have sxs of GERD, uses PPI qod. No lower GI sxs either. Last EGD 2013 - no intestinal metaplasia on numerous esophageal bx but did have some on initial 2011 EGD Colonoscopy 2013 with "inadequate prep" and multip hyperplastic polyps including some from tranverse colon.  Medications, allergies, past medical history, past surgical history, family history and social history are reviewed and updated in the EMR.  Review of Systems As per HPI, otherwise all negative    Objective:   Physical Exam @BP  108/70 mmHg  Pulse 64  Ht 5\' 4"  (1.626 m)  Wt 148 lb (67.132 kg)  BMI 25.39 kg/m2@  General:  Well-developed, well-nourished and in no acute distress Eyes:  anicteric. Lungs: Clear to auscultation bilaterally. Heart:  S1S2, no rubs, murmurs, gallops. Abdomen:  soft, non-tender, no hepatosplenomegaly, hernia, or mass and BS+.  Rectal: Deferred  Extremities:   no edema, cyanosis or clubbing Skin   no rash. Neuro:  A&O x 3.  Psych:  appropriate mood and  Affect.  Data Reviewed: As per HPI - endoscopy reports and pathology reports 2011 and 2013    Assessment & Plan:  Barrett's esophagus without dysplasia - Plan: Ambulatory referral to Gastroenterology  Hx of colonic polyps - Plan: Ambulatory referral to Gastroenterology  Repeat an EGD to see if she really has Barrett's esophagus - I have my doubts  Colonoscopy needed given hx polyps and inadequate prep - polyps reported as hyper plastic which may be precancerous if were actually sessile serrated polyps.  The risks and benefits as well as alternatives of endoscopic procedure(s) have been discussed and reviewed. All questions answered. The patient agrees to proceed. [id number],  MD

## 2015-01-28 NOTE — Assessment & Plan Note (Signed)
Maybe Check EGD

## 2015-01-28 NOTE — Telephone Encounter (Signed)
Pt is at Dr. Carlean Purl office right now and needed a referral.  Dr. Silvano Rusk Dx code- K22.70                Z86.010 Id- [id number]   Referral #- [id number]

## 2015-01-29 ENCOUNTER — Encounter: Payer: Self-pay | Admitting: Internal Medicine

## 2015-02-11 ENCOUNTER — Telehealth: Payer: Self-pay | Admitting: Family Medicine

## 2015-02-11 ENCOUNTER — Encounter: Payer: Self-pay | Admitting: Family Medicine

## 2015-02-11 DIAGNOSIS — M722 Plantar fascial fibromatosis: Secondary | ICD-10-CM

## 2015-02-11 DIAGNOSIS — M7752 Other enthesopathy of left foot: Secondary | ICD-10-CM

## 2015-02-11 NOTE — Telephone Encounter (Signed)
Go ahead and send her to podiatry

## 2015-02-11 NOTE — Telephone Encounter (Signed)
Pt called and stated that her last office visit she was told that if her issue did not get any better she would need to see a podiatrist. She states it is not any better. Please refer pt to foot doctor. She does not want to see Dr. Doran Durand. Pt can be reached at [phone].

## 2015-02-12 NOTE — Telephone Encounter (Signed)
Referral put in [facility]

## 2015-03-13 ENCOUNTER — Ambulatory Visit: Payer: Self-pay | Admitting: Podiatry

## 2015-03-17 ENCOUNTER — Ambulatory Visit: Payer: Self-pay | Admitting: Sports Medicine

## 2015-03-18 ENCOUNTER — Telehealth: Payer: Self-pay | Admitting: Family Medicine

## 2015-03-18 NOTE — Telephone Encounter (Signed)
Pt called and advised she wants to see Dr. Redmond School from now on.  Dr. Redmond School advised.

## 2015-03-28 ENCOUNTER — Ambulatory Visit: Payer: Self-pay | Admitting: Podiatry

## 2015-03-31 ENCOUNTER — Encounter: Payer: Self-pay | Admitting: Family Medicine

## 2015-03-31 ENCOUNTER — Ambulatory Visit (INDEPENDENT_AMBULATORY_CARE_PROVIDER_SITE_OTHER): Payer: BLUE CROSS/BLUE SHIELD | Admitting: Family Medicine

## 2015-03-31 VITALS — BP 148/72 | HR 75 | Temp 98.2°F | Ht 63.75 in | Wt 154.8 lb

## 2015-03-31 DIAGNOSIS — J209 Acute bronchitis, unspecified: Secondary | ICD-10-CM

## 2015-03-31 DIAGNOSIS — G479 Sleep disorder, unspecified: Secondary | ICD-10-CM

## 2015-03-31 MED ORDER — AZITHROMYCIN 500 MG PO TABS: 500.0000 mg | ORAL_TABLET | Freq: Every day | ORAL | Status: DC

## 2015-03-31 MED ORDER — ZOLPIDEM TARTRATE 10 MG PO TABS: 10.0000 mg | ORAL_TABLET | Freq: Every evening | ORAL | Status: DC | PRN

## 2015-03-31 NOTE — Progress Notes (Signed)
   Subjective:    Patient ID: Anna Mckinney, female    DOB: 1950-04-19, 65 y.o.   MRN: [id number]  HPI She complains of a five-day history started with malaise followed by sneezing and some back discomfort. She then developed nasal congestion, earache, sore throat. Her cough is dry. She has been using OTC meds with some relief of her symptoms. She quit smoking in 2010.Also is had some sleep disturbance from this and occasional difficulty because of her daughter not sleeping well.   Review of Systems     Objective:   Physical Exam Alert and in no distress. Tympanic membranes and canals are normal. Pharyngeal area is normal. Neck is supple without adenopathy or thyromegaly. Cardiac exam shows a regular sinus rhythm without murmurs or gallops. Lungs are clear to auscultation.        Assessment & Plan:  Acute bronchitis, unspecified organism - Plan: azithromycin (ZITHROMAX) 500 MG tablet  Sleep disturbance - Plan: zolpidem (AMBIEN) 10 MG tablet Recommend conservative care but did give a prescription. Recommend she wait 2 days before starting this to see she will turn the corner get better on her own. I will also give a small prescription of Ambien to help with sleep.

## 2015-04-01 ENCOUNTER — Encounter: Payer: 59 | Admitting: Internal Medicine

## 2015-04-02 ENCOUNTER — Telehealth: Payer: Self-pay | Admitting: Family Medicine

## 2015-04-02 DIAGNOSIS — J209 Acute bronchitis, unspecified: Secondary | ICD-10-CM

## 2015-04-02 MED ORDER — AZITHROMYCIN 500 MG PO TABS: 500.0000 mg | ORAL_TABLET | Freq: Every day | ORAL | Status: DC

## 2015-04-02 MED ORDER — BENZONATATE 100 MG PO CAPS: 200.0000 mg | ORAL_CAPSULE | Freq: Two times a day (BID) | ORAL | Status: DC | PRN

## 2015-04-02 NOTE — Telephone Encounter (Signed)
Let her know that it was called in

## 2015-04-02 NOTE — Telephone Encounter (Signed)
Pt called, finished z pack and no better.  Please send in 2nd round zpack and something for cough to [place].

## 2015-04-11 ENCOUNTER — Telehealth: Payer: Self-pay | Admitting: Family Medicine

## 2015-04-11 ENCOUNTER — Other Ambulatory Visit: Payer: Self-pay | Admitting: Medical

## 2015-04-11 MED ORDER — AMOXICILLIN 500 MG PO TABS: ORAL_TABLET | ORAL | Status: DC

## 2015-04-11 NOTE — Telephone Encounter (Signed)
Amoxicillin sent

## 2015-04-11 NOTE — Telephone Encounter (Signed)
Pt called and stated that she had finished her second round of medication and she is still sick. She is requesting another round. Pt was informed that she may have to be seen again. Due to the fact that [place] has already left I am sending back to both [place] and [place]. Pt uses [facility] and can be reached at [phone].

## 2015-04-11 NOTE — Telephone Encounter (Signed)
Pt is aware.  

## 2015-08-21 ENCOUNTER — Ambulatory Visit
Admission: RE | Admit: 2015-08-21 | Discharge: 2015-08-21 | Disposition: A | Discharge status: Home / Self Care | Payer: BLUE CROSS/BLUE SHIELD | Source: Ambulatory Visit | Attending: Family Medicine | Admitting: Family Medicine

## 2015-08-21 ENCOUNTER — Other Ambulatory Visit: Payer: Self-pay | Admitting: Family Medicine

## 2015-08-21 ENCOUNTER — Encounter: Payer: Self-pay | Admitting: Family Medicine

## 2015-08-21 ENCOUNTER — Ambulatory Visit (INDEPENDENT_AMBULATORY_CARE_PROVIDER_SITE_OTHER): Payer: BLUE CROSS/BLUE SHIELD | Admitting: Family Medicine

## 2015-08-21 VITALS — BP 132/82 | HR 60 | Ht 63.5 in | Wt 153.0 lb

## 2015-08-21 DIAGNOSIS — M62830 Muscle spasm of back: Secondary | ICD-10-CM | POA: Diagnosis not present

## 2015-08-21 DIAGNOSIS — M546 Pain in thoracic spine: Secondary | ICD-10-CM

## 2015-08-21 LAB — POCT URINALYSIS DIPSTICK
BILIRUBIN UA: NEGATIVE
Blood, UA: NEGATIVE
Glucose, UA: NEGATIVE
KETONES UA: NEGATIVE
LEUKOCYTES UA: NEGATIVE
Nitrite, UA: NEGATIVE
Protein, UA: NEGATIVE
Spec Grav, UA: >=1.03
Urobilinogen, UA: NEGATIVE
pH, UA: 6

## 2015-08-21 MED ORDER — KETOROLAC TROMETHAMINE 60 MG/2ML IM SOLN
60.0000 mg | Freq: Once | INTRAMUSCULAR | Status: AC
  Administered 2015-08-21: 60 mg via INTRAMUSCULAR

## 2015-08-21 MED ORDER — CYCLOBENZAPRINE HCL 10 MG PO TABS: 5.0000 mg | ORAL_TABLET | Freq: Three times a day (TID) | ORAL | Status: DC | PRN

## 2015-08-21 MED ORDER — MELOXICAM 7.5 MG PO TABS: 7.5000 mg | ORAL_TABLET | Freq: Every day | ORAL | Status: DC

## 2015-08-21 NOTE — Progress Notes (Signed)
Chief Complaint  Patient presents with  . Back Pain    has been seeing chiro, Dr.Penrod and having adjustments. This past Sunday her right hip started hurting her. Jarred her back yesterday and is now having mid back spasms. Chiro wanted her to be see to make sure there is no underlying issues.    Seeing chiropractor periodically for years.  She usually needs an adjustment at her thoracic spine, as well as her neck for discomfort (he adjusts entire spine).  She had an adjustment several weeks ago, felt okay. Sunday, 4 days ago, she felt a catch of pain at her right lateral hip. She twisted some while standing at church, and had acute onset of this pain. Discomfort lasted off/on for a couple of days, then resolved. She no longer has hip pain.  Yesterday, around 1:30pm while helping Shelia in the shower and shaving her underarms, Freda Munro jerked her arm, and she had acute onset of spasm in the middle of her back, at the level of her bra strap.  Pain was 10/10.  She took tylenol, tried a hot shower, as well as icing, but nothing helped.  Spasms start with certain movements, can't get comfortable at night. She saw chiro today, had neck adjusted today. He did adjustment in her thoracic spine, felt a pop, but there was a tender on the left side of her back that he wanted evaluated prior to any other adjustment.  Denies any abdominal pain, nausea, vomiting, numbness, tingling.  PMH, PSH, SH reviewed  Outpatient Encounter Prescriptions as of 08/21/2015  Medication Sig Note  . acetaminophen (TYLENOL) 500 MG tablet Take 1,000 mg by mouth every 6 (six) hours as needed.   [name] acidophilus (RISAQUAD) CAPS capsule Take 1 capsule by mouth daily.   . Cholecalciferol (VITAMIN D3) 5000 UNITS TABS Take 1 tablet by mouth daily.   . vitamin C (ASCORBIC ACID) 500 MG tablet Take 500 mg by mouth 2 (two) times daily.   . cyclobenzaprine (FLEXERIL) 10 MG tablet Take 0.5-1 tablets (5-10 mg total) by mouth 3 (three) times daily  as needed for muscle spasms.   [name] esomeprazole (NEXIUM) 40 MG capsule Take 40 mg by mouth. Reported on 08/21/2015 11/18/2014: Using it prn, only once or twice a month  . meloxicam (MOBIC) 7.5 MG tablet Take 1-2 tablets (7.5-15 mg total) by mouth daily.   . pravastatin (PRAVACHOL) 40 MG tablet Take 1 tablet (40 mg total) by mouth daily. (Patient not taking: Reported on 08/21/2015)   . zolpidem (AMBIEN) 10 MG tablet Take 1 tablet (10 mg total) by mouth at bedtime as needed for sleep. (Patient not taking: Reported on 08/21/2015)   . [DISCONTINUED] amoxicillin (AMOXIL) 500 MG tablet 2 tablets po BID x 10 days   . [DISCONTINUED] azithromycin (ZITHROMAX) 500 MG tablet Take 1 tablet (500 mg total) by mouth daily.   . [DISCONTINUED] benzonatate (TESSALON) 100 MG capsule Take 2 capsules (200 mg total) by mouth 2 (two) times daily as needed for cough.   . [DISCONTINUED] zolpidem (AMBIEN) 10 MG tablet  08/21/2015: Received from: External Pharmacy  . [EXPIRED] ketorolac (TORADOL) injection 60 mg     No facility-administered encounter medications on file as of 08/21/2015.   (meloxicam and flexeril rx'd today, not prior to visit).  Allergies  Allergen Reactions  . Hydrocodone Itching and Nausea And Vomiting  . Codeine   . Doxycycline Other (See Comments)    Stomach pain   she reports intolerance to all pain meds, including tramadol.  ROS: No fever, chills, urinary complaints, numbness, tingling, weakness, bowel/bladder dysfunction. No URI symptoms, chest pain, palpitations, abdominal pain, bowel changes, nausea, vomiting.  Hip pain resolved.  +thoracic back pain as per HPI  PHYSICAL EXAM: BP 132/82 mmHg  Pulse 60  Ht 5' 3.5" (1.613 m)  Wt 153 lb (69.4 kg)  BMI 26.67 kg/m2  Well developed, pleasant female, sitting very still, with obvious discomfort with movements Back: Tender at two levels of thoracic spine underneath her bra.  She also has tenderness at the left lower thoracic paraspinous muscles on  the left, minimal spasm. Remainder of spine and muscles are nontender Neuro: DTR's, strength and sensation normal. Negative SLR Heart: regular rate and rhythm Lungs: clear Abdomen: soft, nontender  ASSESSMENT/PLAN:  Midline thoracic back pain - Plan: ketorolac (TORADOL) injection 60 mg, CANCELED: DG Thoracic Spine 2 View  Thoracic back pain, unspecified back pain laterality - Plan: POCT Urinalysis Dipstick, ketorolac (TORADOL) injection 60 mg  Muscle spasm of back - Plan: cyclobenzaprine (FLEXERIL) 10 MG tablet, ketorolac (TORADOL) injection 60 mg  Risks/side effects of all meds reviewed. Ddx of causes of pain reviewed--suspect muscular, as well as degenerative changes.  Check x-ray given bony pain (r/o compression fracture, bone abnl's).  Toradol 60mg  IM   Today we have given you an anti-inflammatory injection.  You need to wait 6 hours prior to taking the meloxicam. The meloxicam is to be taking once daily with food.  If it doesn't bother your stomach, and it isn't helping your pain, you can take 2 at a time.  Take this daily (if tolerated) until you feel better. Use the muscle relaxant if needed for spasm.  We discussed a few different kinds, and decided on the more sedating muscle relaxant.  So, use this with extreme caution, if driving, operating machinery, etc.  Start at 1/2 tablet when taking during the day.  If not too sedating, or if not helpful, you can increase to the full tablet (which will be even more sedating).  Start taking your Nexium daily, to help protect your stomach from the medications.  Continue to use the Biofreeze. Try heat (ie. hot showers or heating pad) followed by stretches and gentle massage over the left. muscular area  Given the pain over the spine itself near your bra-line, I'm recommending that you go to [facility] now for an x-ray.   [address]

## 2015-08-21 NOTE — Patient Instructions (Addendum)
Today we have given you an anti-inflammatory injection.  You need to wait 6 hours prior to taking the meloxicam. The meloxicam is to be taking once daily with food.  If it doesn't bother your stomach, and it isn't helping your pain, you can take 2 at a time.  Take this daily (if tolerated) until you feel better. Use the muscle relaxant if needed for spasm.  We discussed a few different kinds, and decided on the more sedating muscle relaxant.  So, use this with extreme caution, if driving, operating machinery, etc.  Start at 1/2 tablet when taking during the day.  If not too sedating, or if not helpful, you can increase to the full tablet (which will be even more sedating).  Start taking your Nexium daily, to help protect your stomach from the medications.  Continue to use the Biofreeze. Try heat (ie. hot showers or heating pad) followed by stretches and gentle massage over the left. muscular area  Given the pain over the spine itself near your bra-line, I'm recommending that you go to [facility] now for an x-ray.   [address]

## 2015-10-01 ENCOUNTER — Encounter: Payer: Self-pay | Admitting: Medical

## 2015-10-01 ENCOUNTER — Ambulatory Visit (INDEPENDENT_AMBULATORY_CARE_PROVIDER_SITE_OTHER): Payer: BLUE CROSS/BLUE SHIELD | Admitting: Medical

## 2015-10-01 VITALS — BP 130/82 | HR 67 | Temp 98.6°F | Wt 150.0 lb

## 2015-10-01 DIAGNOSIS — G479 Sleep disorder, unspecified: Secondary | ICD-10-CM

## 2015-10-01 DIAGNOSIS — R05 Cough: Secondary | ICD-10-CM | POA: Diagnosis not present

## 2015-10-01 DIAGNOSIS — J029 Acute pharyngitis, unspecified: Secondary | ICD-10-CM

## 2015-10-01 DIAGNOSIS — M25551 Pain in right hip: Secondary | ICD-10-CM

## 2015-10-01 DIAGNOSIS — R059 Cough, unspecified: Secondary | ICD-10-CM

## 2015-10-01 DIAGNOSIS — J988 Other specified respiratory disorders: Secondary | ICD-10-CM

## 2015-10-01 MED ORDER — BENZONATATE 200 MG PO CAPS: 200.0000 mg | ORAL_CAPSULE | Freq: Three times a day (TID) | ORAL | 0 refills | Status: DC | PRN

## 2015-10-01 MED ORDER — CLARITHROMYCIN 500 MG PO TABS: 500.0000 mg | ORAL_TABLET | Freq: Two times a day (BID) | ORAL | 0 refills | Status: DC

## 2015-10-01 MED ORDER — ZOLPIDEM TARTRATE 10 MG PO TABS: 10.0000 mg | ORAL_TABLET | Freq: Every evening | ORAL | 0 refills | Status: DC | PRN

## 2015-10-01 NOTE — Progress Notes (Signed)
Subjective:     Patient ID: Anna Mckinney, female   DOB: 1950/04/19, 65 y.o.   MRN: [id number]  HPI Chief Complaint  Patient presents with  . Nasal Congestion    body aches. mainly in her head. threw up last night.    Here for illness.   She notes several day hx/o stuffy head, headache, sinus pressure, body aches, nausea, vomited once last night, throat feels like cotton.  Has some cough, non productive but is deep.  Symptoms kind of started abruptly.  Feels feverish.  No sweats. Didn't sleep well last night.   Has special needs child at home, wants to avoid them getting anything.   Using tylenol for symptoms. No recent travel.   Has been around 1 person with new pneumonia diagnosis recently, and they were on antibiotics.  Nonsmoker, quit 7 years ago.  No other aggravating or relieving factors.  Hasn't been sleeping well not feeling good this week.  Wants refill on Ambien which she uses prn.  Been having few days of right hip pain. No injury, no fall, no trauma, no fever, no specific activity she relates to the pain.  No other complaint.  Review of Systems     Objective:   Physical Exam BP 130/82   Pulse 67   Temp 98.6 F (37 C) (Tympanic)   Wt 150 lb (68 kg)   BMI 26.15 kg/m   General appearance: alert, no distress, WD/WN HEENT: normocephalic, sclerae anicteric, TMs pearly, nares patent, no discharge , +erythema, pharynx with mild erythema Oral cavity: MMM, no lesions Neck: supple, no lymphadenopathy, no thyromegaly, no masses Heart: RRR, normal S1, S2, no murmurs Lungs: somewhat bronchial sounds throughout, no wheezes, rhonchi, or rales Pulses: 2+ symmetric, upper and lower extremities, normal cap refill Right hip with mild tenderness, mild pain with right hip internal ROM, but ROM is full, no deformity,no clicking, no swelling, no rash, no other abnormal, rest of bilat exam unremarkable No edema Legs neurovascularly intact Back: nontender     Assessment:       Encounter Diagnoses  Name Primary?  . Cough Yes  . Sore throat   . Respiratory infection   . Sleep disturbance   . Right hip pain        Plan:     Cough, sore throat, respiratory infection, recent pneumonia exposure - advised OTC cough/congestion medication such as mucinex DM, rest, hydration and if worsening can use Biaxin.   Tessalon perles Rx prn use.    Sleep disturbance - refill on short term use of Ambien  Right hip pain - advised relative rest, advised stretching and ROM exercise in her pool.  If not improving within 1-2 wk, recheck.

## 2015-10-13 ENCOUNTER — Telehealth: Payer: Self-pay | Admitting: Family Medicine

## 2015-10-13 ENCOUNTER — Encounter: Payer: Self-pay | Admitting: Family Medicine

## 2015-10-13 ENCOUNTER — Ambulatory Visit (INDEPENDENT_AMBULATORY_CARE_PROVIDER_SITE_OTHER): Payer: BLUE CROSS/BLUE SHIELD | Admitting: Family Medicine

## 2015-10-13 VITALS — BP 110/70 | HR 67 | Temp 98.1°F | Wt 147.4 lb

## 2015-10-13 DIAGNOSIS — R1030 Lower abdominal pain, unspecified: Secondary | ICD-10-CM | POA: Diagnosis not present

## 2015-10-13 DIAGNOSIS — R350 Frequency of micturition: Secondary | ICD-10-CM

## 2015-10-13 LAB — POCT URINALYSIS DIPSTICK
Bilirubin, UA: NEGATIVE
Blood, UA: NEGATIVE
Glucose, UA: NEGATIVE
KETONES UA: NEGATIVE
LEUKOCYTES UA: NEGATIVE
Nitrite, UA: NEGATIVE
PH UA: 5
PROTEIN UA: NEGATIVE
UROBILINOGEN UA: NEGATIVE

## 2015-10-13 MED ORDER — NITROFURANTOIN MONOHYD MACRO 100 MG PO CAPS: 100.0000 mg | ORAL_CAPSULE | Freq: Two times a day (BID) | ORAL | 0 refills | Status: DC

## 2015-10-13 NOTE — Progress Notes (Signed)
Patient ID: Anna Mckinney, female   DOB: 07-May-1950, 65 y.o.   MRN: [id number] Subjective:  Anna Mckinney is a 65 y.o. female who complains of possible urinary tract infection.  She has had symptoms for 4 days.  Symptoms include suprapubic pressure and urinary frequency. Patient denies back pain, fever and nausea, vomiting and diarrhea. Denies vaginal bleeding or discharge.  Last UTI was over a year.   Using AZO for current symptoms for past 2 days with relief initially but stopped taking it 2 days ago.  Has been taking antibiotics for possible pneumonia and probiotics and she finished them about a week ago.  Hysterectomy in past.  Reports having normal bowel movements and last one this morning.  Is due for a colonoscopy. Plans to call to schedule this.   Patient does not have a history of recurrent UTI. Patient does not have a history of pyelonephritis.  No other aggravating or relieving factors.  No other c/o.  Past Medical History:  Diagnosis Date  . Barrett's esophagus     [place] (Dr. Ferdinand Lango)  . Gastritis   . GERD (gastroesophageal reflux disease)   . Hyperplastic colon polyp 11/23/2010   Dr Virgel Bouquet  . Insomnia   . Internal prolapsed hemorrhoids s/p THD hemorrhoidal ligation/pexy 10/16/2012  . Osteopenia   . PONV (postoperative nausea and vomiting)    PT'S HEART RATE DOWN TO 40 WITH HER LAST SURGERY - SHE REMEMBERS BEING ASKED TO WAKE UP SO HEART RATE WOULD GO UP - SCARED HER.  . Sleep apnea 2015   mild  . Ulcer of esophagus 10/08/2009   Dr Virgel Bouquet   Past Surgical History:  Procedure Laterality Date  . ABDOMINAL HYSTERECTOMY  1984   1 ovary remains; removed for "cancer cells"  . CATARACT EXTRACTION Bilateral end of 2015 & early 2016   Dr. Lucita Ferrara  . CHOLECYSTECTOMY  2010  . COLONOSCOPY W/ BIOPSIES    . ESOPHAGOGASTRODUODENOSCOPY    . EVALUATION UNDER ANESTHESIA WITH HEMORRHOIDECTOMY N/A 01/25/2013   Procedure: EXAM UNDER ANESTHESIA WITH HEMORRHOIDECTOMY;   Surgeon: Adin Hector, MD;  Location: [facility];  Service: General;  Laterality: N/A;  . posterolateral external hemorrhoidactomy  10/20/2012   left  . TRANSANAL HEMORRHOIDAL DEARTERIALIZATION  10/20/2012   hemorrhoidal ligation and pexy.    ROS as in subjective  Reviewed allergies, medications, past medical, surgical, and social history.    Objective: Vitals:   10/13/15 1350  BP: 110/70  Pulse: 67  Temp: 98.1 F (36.7 C)    General appearance: alert, no distress, WD/WN, female Abdomen: +bs, soft, slightly tender over suprapubic area midline otherwise nontender, no guarding or rebound, non distended, no masses, no hepatomegaly, no splenomegaly, no bruits Back: no CVA tenderness GU: deferred.      Laboratory:  Urine dipstick: negative for all components.  Spec grav >1.030    Sent for culture  Assessment: Urinary frequency - Plan: Urinalysis Dipstick  Suprapubic pressure, unspecified laterality - Plan: Urinalysis Dipstick    Plan: Discussed symptoms, diagnosis, possible complications, and usual course of illness.  Begin medication Macrobid.  Advised increased water intake, can use OTC Tylenol for pain.    Advised That if she is not back to baseline after completing the antibiotic that she should let us know. She will let us know if she worsens. She is aware that if symptoms persist she will need to have a pelvic exam.   Urine culture sent.

## 2015-10-13 NOTE — Telephone Encounter (Signed)
error 

## 2015-10-13 NOTE — Patient Instructions (Addendum)
Stay well hydrated. Call if not back to normal after completing the antibiotic. We will call you if your urine culture shows a different treatment is needed.

## 2015-10-14 LAB — URINE CULTURE: ORGANISM ID, BACTERIA: NO GROWTH

## 2015-11-03 ENCOUNTER — Ambulatory Visit (HOSPITAL_COMMUNITY)
Admission: RE | Admit: 2015-11-03 | Discharge: 2015-11-03 | Disposition: A | Discharge status: Home / Self Care | Payer: BLUE CROSS/BLUE SHIELD | Source: Ambulatory Visit | Attending: Family Medicine | Admitting: Family Medicine

## 2015-11-03 ENCOUNTER — Ambulatory Visit (INDEPENDENT_AMBULATORY_CARE_PROVIDER_SITE_OTHER): Payer: BLUE CROSS/BLUE SHIELD | Admitting: Family Medicine

## 2015-11-03 ENCOUNTER — Ambulatory Visit: Payer: BLUE CROSS/BLUE SHIELD | Admitting: Family Medicine

## 2015-11-03 ENCOUNTER — Encounter (HOSPITAL_COMMUNITY): Payer: Self-pay

## 2015-11-03 VITALS — BP 150/82 | HR 64 | Temp 97.6°F

## 2015-11-03 DIAGNOSIS — K5730 Diverticulosis of large intestine without perforation or abscess without bleeding: Secondary | ICD-10-CM | POA: Diagnosis not present

## 2015-11-03 DIAGNOSIS — R109 Unspecified abdominal pain: Secondary | ICD-10-CM | POA: Diagnosis not present

## 2015-11-03 DIAGNOSIS — K439 Ventral hernia without obstruction or gangrene: Secondary | ICD-10-CM | POA: Diagnosis not present

## 2015-11-03 DIAGNOSIS — M545 Low back pain: Secondary | ICD-10-CM

## 2015-11-03 DIAGNOSIS — N132 Hydronephrosis with renal and ureteral calculous obstruction: Secondary | ICD-10-CM | POA: Insufficient documentation

## 2015-11-03 LAB — POCT URINALYSIS DIPSTICK
BILIRUBIN UA: NEGATIVE
GLUCOSE UA: NEGATIVE
Ketones, UA: NEGATIVE
Leukocytes, UA: NEGATIVE
NITRITE UA: NEGATIVE
PH UA: 6
RBC UA: NEGATIVE
UROBILINOGEN UA: 1

## 2015-11-03 MED ORDER — KETOROLAC TROMETHAMINE 60 MG/2ML IM SOLN
60.0000 mg | Freq: Once | INTRAMUSCULAR | Status: AC
  Administered 2015-11-03: 60 mg via INTRAMUSCULAR

## 2015-11-03 MED ORDER — KETOROLAC TROMETHAMINE 60 MG/2ML IM SOLN: 60.0000 mg | Freq: Once | INTRAMUSCULAR | 0 refills | Status: DC

## 2015-11-03 MED ORDER — ONDANSETRON HCL 4 MG PO TABS: 4.0000 mg | ORAL_TABLET | Freq: Three times a day (TID) | ORAL | 0 refills | Status: DC | PRN

## 2015-11-03 MED ORDER — OXYCODONE-ACETAMINOPHEN 5-325 MG PO TABS: 1.0000 | ORAL_TABLET | Freq: Three times a day (TID) | ORAL | 0 refills | Status: DC | PRN

## 2015-11-03 NOTE — Patient Instructions (Signed)
Take the pain medication and the nausea medicine at the same time.

## 2015-11-03 NOTE — Progress Notes (Signed)
   Subjective:    Patient ID: Anna Mckinney, female    DOB: 22-Sep-1950, 65 y.o.   MRN: [id number]  HPI She complains of the onset of right flank pain with nausea that has been constant since earlier today. No fever, chills, dysuria or urgency. He states that the pain is as bad as childbirth.   Review of Systems     Objective:   Physical Exam Alert and complaining of extreme pain. No tenderness to palpation in the CVA or abdominal area. No rebound noted. Urine microscopic showed 0-3 red blood cells.       Assessment & Plan:  Right flank pain - Plan: POCT urinalysis dipstick, oxyCODONE-acetaminophen (ROXICET) 5-325 MG tablet, ondansetron (ZOFRAN) 4 MG tablet, ketorolac (TORADOL) injection 60 mg, CT ABDOMEN PELVIS WO CONTRAST, DISCONTINUED: ketorolac (TORADOL) 60 MG/2ML SOLN injection, CANCELED: CT ABDOMEN PELVIS WO CONTRAST Instructed her to drink plenty of fluids. She does have a history of difficulty with nausea from codeine however I will give this to her as well as Zofran. CT scan ordered. Follow-up pending results of scan.

## 2015-11-04 ENCOUNTER — Encounter: Payer: Self-pay | Admitting: Family Medicine

## 2015-11-04 ENCOUNTER — Other Ambulatory Visit: Payer: Self-pay

## 2015-11-04 MED ORDER — TAMSULOSIN HCL 0.4 MG PO CAPS: 0.4000 mg | ORAL_CAPSULE | Freq: Every day | ORAL | 0 refills | Status: DC

## 2015-11-04 NOTE — Progress Notes (Signed)
She did indeed have a stone. She is doing well with her codeine but she is having some itching. I explained that this is a histamine reaction and she can take Benadryl she needs to. I will also give her Flomax. Reassured her that this should pass since it is a 1 mm stone.

## 2015-11-20 ENCOUNTER — Encounter: Payer: 59 | Admitting: Family Medicine

## 2015-12-02 ENCOUNTER — Encounter: Payer: Self-pay | Admitting: Family Medicine

## 2015-12-02 ENCOUNTER — Telehealth: Payer: Self-pay | Admitting: Family Medicine

## 2015-12-02 ENCOUNTER — Ambulatory Visit (INDEPENDENT_AMBULATORY_CARE_PROVIDER_SITE_OTHER): Payer: BLUE CROSS/BLUE SHIELD | Admitting: Family Medicine

## 2015-12-02 VITALS — BP 110/70 | HR 60 | Ht 65.0 in | Wt 149.0 lb

## 2015-12-02 DIAGNOSIS — Z87442 Personal history of urinary calculi: Secondary | ICD-10-CM | POA: Diagnosis not present

## 2015-12-02 DIAGNOSIS — Z1159 Encounter for screening for other viral diseases: Secondary | ICD-10-CM | POA: Diagnosis not present

## 2015-12-02 DIAGNOSIS — K219 Gastro-esophageal reflux disease without esophagitis: Secondary | ICD-10-CM

## 2015-12-02 DIAGNOSIS — Q828 Other specified congenital malformations of skin: Secondary | ICD-10-CM | POA: Diagnosis not present

## 2015-12-02 DIAGNOSIS — K2270 Barrett's esophagus without dysplasia: Secondary | ICD-10-CM

## 2015-12-02 DIAGNOSIS — G4700 Insomnia, unspecified: Secondary | ICD-10-CM | POA: Diagnosis not present

## 2015-12-02 DIAGNOSIS — Z23 Encounter for immunization: Secondary | ICD-10-CM

## 2015-12-02 DIAGNOSIS — Z0000 Encounter for general adult medical examination without abnormal findings: Secondary | ICD-10-CM | POA: Diagnosis not present

## 2015-12-02 DIAGNOSIS — L821 Other seborrheic keratosis: Secondary | ICD-10-CM | POA: Diagnosis not present

## 2015-12-02 LAB — CBC WITH DIFFERENTIAL/PLATELET
BASOS ABS: 0 {cells}/uL (ref 0–200)
Basophils Relative: 0 %
EOS ABS: 48 {cells}/uL (ref 15–500)
EOS PCT: 1 %
HCT: 42.7 % (ref 35.0–45.0)
Hemoglobin: 14.6 g/dL (ref 11.7–15.5)
LYMPHS PCT: 47 %
Lymphs Abs: 2256 cells/uL (ref 850–3900)
MCH: 29.1 pg (ref 27.0–33.0)
MCHC: 34.2 g/dL (ref 32.0–36.0)
MCV: 85.2 fL (ref 80.0–100.0)
MONOS PCT: 10 %
MPV: 10.8 fL (ref 7.5–12.5)
Monocytes Absolute: 480 cells/uL (ref 200–950)
Neutro Abs: 2016 cells/uL (ref 1500–7800)
Neutrophils Relative %: 42 %
PLATELETS: 213 10*3/uL (ref 140–400)
RBC: 5.01 MIL/uL (ref 3.80–5.10)
RDW: 13.6 % (ref 11.0–15.0)
WBC: 4.8 10*3/uL (ref 4.0–10.5)

## 2015-12-02 LAB — COMPREHENSIVE METABOLIC PANEL
ALT: 15 U/L (ref 6–29)
AST: 19 U/L (ref 10–35)
Albumin: 4.3 g/dL (ref 3.6–5.1)
Alkaline Phosphatase: 62 U/L (ref 33–130)
BUN: 14 mg/dL (ref 7–25)
CHLORIDE: 106 mmol/L (ref 98–110)
CO2: 22 mmol/L (ref 20–31)
Calcium: 9.9 mg/dL (ref 8.6–10.4)
Creat: 0.72 mg/dL (ref 0.50–0.99)
GLUCOSE: 107 mg/dL — AB (ref 65–99)
POTASSIUM: 3.9 mmol/L (ref 3.5–5.3)
SODIUM: 138 mmol/L (ref 135–146)
TOTAL PROTEIN: 6.5 g/dL (ref 6.1–8.1)
Total Bilirubin: 0.6 mg/dL (ref 0.2–1.2)

## 2015-12-02 LAB — POCT URINALYSIS DIPSTICK
Bilirubin, UA: NEGATIVE
GLUCOSE UA: NEGATIVE
Ketones, UA: NEGATIVE
Leukocytes, UA: NEGATIVE
NITRITE UA: NEGATIVE
PROTEIN UA: NEGATIVE
RBC UA: NEGATIVE
SPEC GRAV UA: 1.015
UROBILINOGEN UA: NEGATIVE
pH, UA: 6.5

## 2015-12-02 LAB — LIPID PANEL
CHOL/HDL RATIO: 6.4 ratio — AB (ref <=5.0)
CHOLESTEROL: 186 mg/dL (ref 125–200)
HDL: 29 mg/dL — ABNORMAL LOW (ref >=46)
LDL CALC: 128 mg/dL (ref <130)
Triglycerides: 147 mg/dL (ref <150)
VLDL: 29 mg/dL (ref <30)

## 2015-12-02 MED ORDER — ZOLPIDEM TARTRATE 5 MG PO TABS: 5.0000 mg | ORAL_TABLET | Freq: Every evening | ORAL | 0 refills | Status: DC | PRN

## 2015-12-02 NOTE — Telephone Encounter (Signed)
Phoned in Ambien refill to [facility].

## 2015-12-02 NOTE — Progress Notes (Addendum)
Subjective:   HPI  Anna Mckinney is a 65 y.o. female who presents for a complete physical.  Medical care team includes:     Preventative care: Last ophthalmology visit: 2016 Last dental visit:? Last colonoscopy:6 months ago Last mammogram:11/04/14 Last gynecological exam:07/27/10.Has had a hysterectomy Last EKG: 07/10/13 Last labs:11/18/14 Prior vaccinations: TD or Tdap: 07/29/11 Influenza:12/02/15 Pneumococcal: 23 07/09/05 Shingles/Zostavax: 10/01/13  Advanced directive: No. Information given.  Concerns: She does have previous history of kidney stone but at this time having no difficulty. She also has a lesion present on the left TMJ area as well as skin tags in her axillary areas. She also has difficulty with sleep deprivation. She is taking care of her daughter who has Angelman's syndrome and apparently has to get up several times per night to help take care of her. She also notes difficulty with libido. She is interested in sexual activity but has no OB ON regard to this. In the past she apparently been tried on a statin and did not tolerate this due to myalgias. She has a previous history of Barrett's esophagus but presently is having only intermittent symptoms which she uses Nexium. At this time she is not interested in another endoscopy.   Reviewed their medical, surgical, family, social, medication, and allergy history and updated chart as appropriate.    Review of Systems Constitutional: -fever, -chills, -sweats, -unexpected weight change, -decreased appetite, -fatigue Allergy: -sneezing, -itching, -congestion Dermatology: -changing moles, --rash, -lumps. Lesion mentioned as above ENT: -runny nose, -ear pain, -sore throat, -hoarseness, -sinus pain, -teeth pain, - ringing in ears, -hearing loss, -nosebleeds Cardiology: -chest pain, -palpitations, -swelling, -difficulty breathing when lying flat, -waking up short of breath Respiratory: -cough, -shortness of breath, -difficulty  breathing with exercise or exertion, -wheezing, -coughing up blood Gastroenterology: -abdominal pain, -nausea, -vomiting, -diarrhea, -constipation, -blood in stool, -changes in bowel movement, -difficulty swallowing or eating Hematology: -bleeding, -bruising  Musculoskeletal: -joint aches, -muscle aches, -joint swelling, -back pain, -neck pain, -cramping, -changes in gait Ophthalmology: denies vision changes, eye redness, itching, discharge Urology: -burning with urination, -difficulty urinating, -blood in urine, -urinary frequency, -urgency, -incontinence Neurology: -headache, -weakness, -tingling, -numbness, -memory loss, -falls, -dizziness Psychology: -depressed mood, -agitation, -sleep problems     Objective:   Physical Exam   General appearance: alert, no distress, WD/WN,  Skin: Skin tags noted in both axilla as well as a brownish well-demarcated lesion present over the left TMJ area. HEENT: normocephalic, conjunctiva/corneas normal, sclerae anicteric, PERRLA, EOMi, nares patent, no discharge or erythema, pharynx normal Oral cavity: MMM, tongue normal, teeth normal Neck: supple, no lymphadenopathy, no thyromegaly, no masses, normal ROM  Heart: RRR, normal S1, S2, no murmurs Lungs: CTA bilaterally, no wheezes, rhonchi, or rales Abdomen: +bs, soft, non tender, non distended, no masses, no hepatomegaly, no splenomegaly, no bruits  Musculoskeletal: upper extremities non tender, no obvious deformity, normal ROM throughout, lower extremities non tender, no obvious deformity, normal ROM throughout Extremities: no edema, no cyanosis, no clubbing Pulses: 2+ symmetric, upper and lower extremities, normal cap refill Neurological: alert, oriented x 3, CN2-12 intact, strength normal upper extremities and lower extremities, sensation normal throughout, DTRs 2+ throughout, no cerebellar signs, gait normal Psychiatric: normal affect, behavior normal, pleasant     Assessment and Plan :   Routine  general medical examination at a health care facility - Plan: POCT Urinalysis Dipstick, CBC with Differential/Platelet, Comprehensive metabolic panel, Lipid panel  Barrett's esophagus without dysplasia  Gastroesophageal reflux disease, esophagitis presence not specified  Insomnia -  Plan: zolpidem (AMBIEN) 5 MG tablet  Need for hepatitis C screening test - Plan: Hepatitis C antibody  Accessory skin tags  Seborrheic keratosis - Plan: Ambulatory referral to Dermatology  Need for prophylactic vaccination and inoculation against influenza - Plan: Flu Vaccine QUAD 36+ mos PF IM (Fluarix & Fluzone Quad PF)  History of renal stone Cautioned on the use of Ambien on a regular basis. Encouraged her to use it only once or twice per week. Also encouraged her to make sure that the husband is involved with taking care of his daughter late in the evenings. Continue to use Nexium on an as-needed basis. Can refer for GI evaluation when she is ready. She will see dermatology concerning removal of the seborrheic keratosis since it's on the face and the skin tags. Cuss treatment of kidney stones. She has difficulty with them in the future. I will also discuss the difficulty with libido with my partner to get her thoughts on this. Physical exam - discussed healthy lifestyle, diet, exercise, preventative care, vaccinations, and addressed their concerns.    Follow-up prn I did call her with the results of her blood work and also discussed the libido issue with her. Discussed various options but at this point she has nothing psychological with bothering her. Mentioned the possible use of hormones but she was not interested in that.

## 2015-12-03 LAB — HEPATITIS C ANTIBODY: HCV Ab: NEGATIVE

## 2015-12-17 ENCOUNTER — Other Ambulatory Visit: Payer: Self-pay | Admitting: Family Medicine

## 2015-12-17 DIAGNOSIS — Z1231 Encounter for screening mammogram for malignant neoplasm of breast: Secondary | ICD-10-CM

## 2016-01-06 ENCOUNTER — Ambulatory Visit
Admission: RE | Admit: 2016-01-06 | Discharge: 2016-01-06 | Disposition: A | Discharge status: Home / Self Care | Payer: BLUE CROSS/BLUE SHIELD | Source: Ambulatory Visit | Attending: Family Medicine | Admitting: Family Medicine

## 2016-01-06 DIAGNOSIS — Z1231 Encounter for screening mammogram for malignant neoplasm of breast: Secondary | ICD-10-CM

## 2016-01-16 ENCOUNTER — Ambulatory Visit (INDEPENDENT_AMBULATORY_CARE_PROVIDER_SITE_OTHER): Payer: BLUE CROSS/BLUE SHIELD | Admitting: Family Medicine

## 2016-01-16 ENCOUNTER — Encounter: Payer: Self-pay | Admitting: Family Medicine

## 2016-01-16 VITALS — BP 120/72 | HR 71 | Temp 98.1°F | Wt 150.3 lb

## 2016-01-16 DIAGNOSIS — J0100 Acute maxillary sinusitis, unspecified: Secondary | ICD-10-CM | POA: Diagnosis not present

## 2016-01-16 DIAGNOSIS — G4700 Insomnia, unspecified: Secondary | ICD-10-CM | POA: Diagnosis not present

## 2016-01-16 MED ORDER — ZOLPIDEM TARTRATE 5 MG PO TABS: 5.0000 mg | ORAL_TABLET | Freq: Every evening | ORAL | 0 refills | Status: DC | PRN

## 2016-01-16 MED ORDER — AZELASTINE-FLUTICASONE 137-50 MCG/ACT NA SUSP: 1.0000 | Freq: Two times a day (BID) | NASAL | 0 refills | Status: DC

## 2016-01-16 MED ORDER — AMOXICILLIN 875 MG PO TABS: 875.0000 mg | ORAL_TABLET | Freq: Two times a day (BID) | ORAL | 0 refills | Status: DC

## 2016-01-16 NOTE — Progress Notes (Signed)
Subjective:  Anna Mckinney is a 65 y.o. female who presents for possible sinus infection.  Symptoms include a several day history of maxillary sinus pain, sneezing and purulent nasal drainage, chills, ear pressure and scratchy throat.  Denies fever, chest pain, palpitations, shortness of breath, cough,  nausea, vomiting, diarrhea.   Past history is significant for bronchitis, pneumonia years ago. Patient is a former smoker, quit 7 years ago.  Using antihistamine and tylenol for symptoms.  Denies sick contacts.  No other aggravating or relieving factors.  No other c/o. No recent antibiotic use.   She is requesting a refill on her Ambien. States she has been taking this long-term without any side effects. States she is completely out of the medication and her PCP is not in the office today. States she really can't wait until next week.  ROS as in subjective   Objective: Vitals:   01/16/16 0938  BP: 120/72  Pulse: 71  Temp: 98.1 F (36.7 C)    General appearance: Alert, WD/WN, no distress                             Skin: warm, no rash                           Head: + maxillary and ethmoid sinus tenderness,                            Eyes: conjunctiva normal, corneas clear, PERRLA                            Ears: pearly TMs, external ear canals normal                          Nose: septum midline, turbinates swollen, with erythema and clear discharge             Mouth/throat: MMM, tongue normal, mild pharyngeal erythema                           Neck: supple, no adenopathy, no thyromegaly, nontender                          Heart: RRR, normal S1, S2, no murmurs                         Lungs: CTA bilaterally, no wheezes, rales, or rhonchi       Assessment and Plan:  Acute non-recurrent maxillary sinusitis - Plan: amoxicillin (AMOXIL) 875 MG tablet  Insomnia, unspecified type - Plan: zolpidem (AMBIEN) 5 MG tablet   Suspect she has acute sinusitis. Prescription given for  amoxicillin.  Can use OTC Mucinex for congestion. Dymista sample given for congestion and discharge  Tylenol or Ibuprofen OTC for fever and malaise.  Discussed symptomatic relief, nasal saline flush, and call or return if worse or not back to baseline after finishing the antibiotic.  She is stable on current dose of ambien and her PCP is out of town, partial refill of Lorrin Mais given to patient and she was instructed to follow up with Dr. Redmond School for future refills.

## 2016-01-16 NOTE — Patient Instructions (Signed)

## 2016-03-04 ENCOUNTER — Ambulatory Visit (INDEPENDENT_AMBULATORY_CARE_PROVIDER_SITE_OTHER): Payer: BLUE CROSS/BLUE SHIELD | Admitting: Medical

## 2016-03-04 ENCOUNTER — Encounter: Payer: Self-pay | Admitting: Medical

## 2016-03-04 VITALS — BP 134/70 | HR 61 | Temp 97.9°F | Wt 151.4 lb

## 2016-03-04 DIAGNOSIS — J0100 Acute maxillary sinusitis, unspecified: Secondary | ICD-10-CM | POA: Diagnosis not present

## 2016-03-04 DIAGNOSIS — G4700 Insomnia, unspecified: Secondary | ICD-10-CM

## 2016-03-04 DIAGNOSIS — M549 Dorsalgia, unspecified: Secondary | ICD-10-CM | POA: Insufficient documentation

## 2016-03-04 MED ORDER — CLARITHROMYCIN 500 MG PO TABS: 500.0000 mg | ORAL_TABLET | Freq: Two times a day (BID) | ORAL | 0 refills | Status: DC

## 2016-03-04 MED ORDER — METHOCARBAMOL 500 MG PO TABS: ORAL_TABLET | ORAL | 0 refills | Status: DC

## 2016-03-04 MED ORDER — ZOLPIDEM TARTRATE 5 MG PO TABS: 5.0000 mg | ORAL_TABLET | Freq: Every evening | ORAL | 0 refills | Status: DC | PRN

## 2016-03-04 NOTE — Patient Instructions (Addendum)
Recommendations  Begin Biaxin antibiotic along with mucinex DM for sinuses, cough, congestion  Hydrate well with water  Regarding back pain  You can use the Robaxin muscle relaxer as needed up to once or twice daily. caution as this may cause drowsiness  You can use OTC tylenol or Ibuprofen for pain as needed  Go get a massage which I think will help a lot  Your xray showed some arthritis changes in your thoracic spine back in 08/2015.  This may be causing some of the pain  Do daily stretches.  Avoid injury particular with lifting  Massage Therapy:  Ernestene Mention [facility] [address] [place], [place] [address] [phone] [email or web address]   [place] [address] [place], [place] [address] [phone]

## 2016-03-04 NOTE — Progress Notes (Signed)
Subjective:  Anna Mckinney is a 65 y.o. female who presents for possible sinus infection.  She notes about a week of sinus pressure, headaches, some nausea, vomited a few times last night.    Not sure why she got nauseated.  Ate some soup and chicken dumplings, got nauseated afterwards, but does have post nasal drainage.   Husband was sick recently but he is improving.   Using tylenol, mucinex.   Non smoker.   Still having pains in middle of her back.  Can't get comfortable. Right at bra line.   When she stands, feels a catch in her pain.   No fall, no injury, no trauma.   She notes seeing one of the female providers here for same few months ago.   On a different day with a little different lower back pian was found to have kidney stone few months ago.  No rash.   No breast pain, no breast lumps.  Is up to date on mammogram.   Hasn't had a massage in a while.   Does have handicapped daughter she has to take care of and lift, but no other strenuous activity or exercise.    No numbness, tingling or weakness in hands.   Sleeps on side.   Mattress is fairly new.  No fever, no weight loss.   No other aggravating or relieving factors.    Wants refill on Ambien she uses prn.   Her daughter keeps her up at night some as she has usual sleep habits.   No other c/o.  Past Medical History:  Diagnosis Date  . Barrett's esophagus     [place] (Dr. Ferdinand Mckinney)  . Gastritis   . GERD (gastroesophageal reflux disease)   . Hyperplastic colon polyp 11/23/2010   Dr Anna Mckinney  . Insomnia   . Internal prolapsed hemorrhoids s/p THD hemorrhoidal ligation/pexy 10/16/2012  . Osteopenia   . PONV (postoperative nausea and vomiting)    PT'S HEART RATE DOWN TO 40 WITH HER LAST SURGERY - SHE REMEMBERS BEING ASKED TO WAKE UP SO HEART RATE WOULD GO UP - SCARED HER.  . Sleep apnea 2015   mild  . Ulcer of esophagus 10/08/2009   Dr Anna Mckinney    ROS as in subjective   Objective: BP 134/70   Pulse 61   Temp 97.9 F  (36.6 C)   Wt 151 lb 6.4 oz (68.7 kg)   SpO2 98%   BMI 25.19 kg/m   General appearance: Alert, WD/WN, no distress                             Skin: warm, no rash                           Head: +mild maxillary sinus tenderness,                            Eyes: conjunctiva normal, corneas clear, PERRLA                            Ears: flat TMs, external ear canals normal                          Nose: septum midline, turbinates swollen, with erythema and clear discharge  Mouth/throat: MMM, tongue normal, mild pharyngeal erythema                           Neck: supple, no adenopathy, no thyromegaly, non tender                          Heart: RRR, normal S1, S2, no murmurs                         Lungs: CTA bilaterally, no wheezes, rales, or rhonchi Tender mid back around T4- T5 area mid line.  No deformity or rash or asymmetry Arms nontender, normal ROM Arms neurovascularly intact       Assessment  Encounter Diagnoses  Name Primary?  . Acute non-recurrent maxillary sinusitis Yes  . Mid back pain   . Insomnia, unspecified type       Plan: Discussed her concerns, symptoms, exam findings.   Reviewed 08/2015 xrays and CT from earlier in the year.  Recommendations  Begin Biaxin antibiotic along with mucinex DM for sinuses, cough, congestion  Hydrate well with water  Regarding back pain  You can use the Robaxin muscle relaxer as needed up to once or twice daily. caution as this may cause drowsiness  You can use OTC tylenol or Ibuprofen for pain as needed  Go get a massage which I think will help a lot  Your xray showed some arthritis changes in your thoracic spine back in 08/2015.  This may be causing some of the pain  Do daily stretches.  Avoid injury particular with lifting  Insomnia Refilled Ambien for prn use, discussed risks/benefits   Anna Mckinney was seen today for possible sinsus  infection , back pain.  Diagnoses and all orders for this visit:  Acute  non-recurrent maxillary sinusitis  Mid back pain  Insomnia, unspecified type -     zolpidem (AMBIEN) 5 MG tablet; Take 1 tablet (5 mg total) by mouth at bedtime as needed for sleep.  Other orders -     methocarbamol (ROBAXIN) 500 MG tablet; 1 tablet po QHS prn or up to BID prn for back spasm -     clarithromycin (BIAXIN) 500 MG tablet; Take 1 tablet (500 mg total) by mouth 2 (two) times daily.

## 2016-07-29 ENCOUNTER — Telehealth: Payer: Self-pay | Admitting: Family Medicine

## 2016-07-29 DIAGNOSIS — G4700 Insomnia, unspecified: Secondary | ICD-10-CM

## 2016-07-29 MED ORDER — ZOLPIDEM TARTRATE 5 MG PO TABS: 5.0000 mg | ORAL_TABLET | Freq: Every evening | ORAL | 0 refills | Status: DC | PRN

## 2016-07-29 NOTE — Telephone Encounter (Signed)
Per [facility]-ok to renew. Called in #30. Victorino December

## 2016-07-29 NOTE — Telephone Encounter (Signed)
Pt needs refill Ambien to [facility]

## 2016-09-02 ENCOUNTER — Telehealth: Payer: Self-pay | Admitting: Family Medicine

## 2016-09-02 DIAGNOSIS — G4700 Insomnia, unspecified: Secondary | ICD-10-CM

## 2016-09-02 MED ORDER — ZOLPIDEM TARTRATE 5 MG PO TABS: 5.0000 mg | ORAL_TABLET | Freq: Every evening | ORAL | 0 refills | Status: DC | PRN

## 2016-09-02 NOTE — Telephone Encounter (Signed)
Please call in. Then have her follow up with Dr. Redmond School.

## 2016-09-02 NOTE — Telephone Encounter (Signed)
Called in med to pharmacy and pt was advised to follow-up with Dr. Redmond School

## 2016-09-02 NOTE — Telephone Encounter (Signed)
Pt states her back really hurting and needs refill Ambien to help her sleep to [facility], last filled 07/29/16

## 2016-09-30 ENCOUNTER — Encounter: Payer: Self-pay | Admitting: Medical

## 2016-09-30 ENCOUNTER — Ambulatory Visit (INDEPENDENT_AMBULATORY_CARE_PROVIDER_SITE_OTHER): Payer: Medicare Other | Admitting: Medical

## 2016-09-30 VITALS — BP 110/70 | HR 86 | Temp 98.4°F | Wt 148.6 lb

## 2016-09-30 DIAGNOSIS — R0989 Other specified symptoms and signs involving the circulatory and respiratory systems: Secondary | ICD-10-CM

## 2016-09-30 DIAGNOSIS — J0100 Acute maxillary sinusitis, unspecified: Secondary | ICD-10-CM | POA: Diagnosis not present

## 2016-09-30 MED ORDER — CLARITHROMYCIN 500 MG PO TABS: 500.0000 mg | ORAL_TABLET | Freq: Two times a day (BID) | ORAL | 0 refills | Status: DC

## 2016-09-30 NOTE — Progress Notes (Signed)
Subjective:  Anna Mckinney is a 66 y.o. female who presents for possible sinus infection.  She notes several day hx/o green nasal discharge, maxillary sinusitis pressure, some cough, some sore throat, ear pressure, some nausea.  No sick contacts.   Using tylenol.  Quit smoking 9 years ago.  Smoked from age 11yo to age 44yo.  No other aggravating or relieving factors.  No other c/o.  Past Medical History:  Diagnosis Date  . Barrett's esophagus     [place] (Dr. Ferdinand Lango)  . Gastritis   . GERD (gastroesophageal reflux disease)   . Hyperplastic colon polyp 11/23/2010   Dr Virgel Bouquet  . Insomnia   . Internal prolapsed hemorrhoids s/p THD hemorrhoidal ligation/pexy 10/16/2012  . Osteopenia   . PONV (postoperative nausea and vomiting)    PT'S HEART RATE DOWN TO 40 WITH HER LAST SURGERY - SHE REMEMBERS BEING ASKED TO WAKE UP SO HEART RATE WOULD GO UP - SCARED HER.  . Sleep apnea 2015   mild  . Ulcer of esophagus 10/08/2009   Dr Virgel Bouquet    ROS as in subjective   Objective: BP 110/70   Pulse 86   Temp 98.4 F (36.9 C)   Wt 148 lb 9.6 oz (67.4 kg)   SpO2 97%   BMI 24.73 kg/m   General appearance: Alert, WD/WN, no distress                             Skin: warm, no rash                           Head: +maxillary sinus tenderness,                            Eyes: conjunctiva normal, corneas clear, PERRLA                            Ears: serous effusions behind both TMs, external ear canals normal                          Nose: septum midline, turbinates swollen, with erythema and mucoid discharge             Mouth/throat: MMM, tongue normal, mild pharyngeal erythema                           Neck: supple, no adenopathy, no thyromegaly, non tender                          Heart: RRR, normal S1, S2, no murmurs                         Lungs: coarse sounds but  no wheezes, rales, or rhonchi       Assessment  Encounter Diagnoses  Name Primary?  . Acute non-recurrent  maxillary sinusitis Yes  . Chest congestion       Plan: Specific home care recommendations today include:  Only take over-the-counter (OTC) or prescription medicines for pain, discomfort, or fever as directed by your caregiver.    Decongestant: You may use OTC Guaifenesin (Mucinex plain) for congestion.  You may use Pseudoephedrine (Sudafed) only if you don't have  blood pressure problems or a diagnosis of hypertension.  Cough suppression: If you have cough from drainage, you may use over-the-counter Dextromethorphan (Delsym) as directed on the label  Pain/fever relief: You may use over-the-counter Tylenol for pain or fever  Drink extra fluids. Fluids help thin the mucus so your sinuses can drain more easily.   Applying either moist heat or ice packs to the sinus areas may help relieve discomfort.  Use saline nasal sprays to help moisten your sinuses. The sprays can be found at your local drugstore.   Prescription (s) given: Biaxin  Anna Mckinney was seen today for cough and runny nose.  Diagnoses and all orders for this visit:  Acute non-recurrent maxillary sinusitis  Chest congestion  Other orders -     clarithromycin (BIAXIN) 500 MG tablet; Take 1 tablet (500 mg total) by mouth 2 (two) times daily.   Patient was advised to call or return if worse or not improving in the next few days.    Patient voiced understanding of diagnosis, recommendations, and treatment plan.  Advised she schedule medicare wellness IPPE visit.

## 2016-10-06 ENCOUNTER — Telehealth: Payer: Self-pay | Admitting: Family Medicine

## 2016-10-06 NOTE — Telephone Encounter (Signed)
Left you an note. About this

## 2016-10-06 NOTE — Telephone Encounter (Signed)
Forwarding to shane 

## 2016-10-06 NOTE — Telephone Encounter (Signed)
Is she also using OTC medication for congestion as well?  She has NO improvements at all?    (is she wanting a different antibiotic?)

## 2016-10-06 NOTE — Telephone Encounter (Signed)
Still not feeling any better. Still very congested. Never improved at all.

## 2016-10-07 ENCOUNTER — Other Ambulatory Visit: Payer: Self-pay | Admitting: Medical

## 2016-10-07 MED ORDER — PREDNISONE 20 MG PO TABS: ORAL_TABLET | ORAL | 0 refills | Status: DC

## 2016-10-07 NOTE — Telephone Encounter (Signed)
Informed pt that rx was ready at the pharmacy

## 2016-10-07 NOTE — Telephone Encounter (Signed)
I sent a different medication to help clear her symptom up

## 2016-10-12 ENCOUNTER — Other Ambulatory Visit: Payer: Self-pay | Admitting: Medical

## 2016-10-12 ENCOUNTER — Ambulatory Visit
Admission: RE | Admit: 2016-10-12 | Discharge: 2016-10-12 | Disposition: A | Discharge status: Home / Self Care | Payer: Medicare Other | Source: Ambulatory Visit | Attending: Medical | Admitting: Medical

## 2016-10-12 ENCOUNTER — Ambulatory Visit (INDEPENDENT_AMBULATORY_CARE_PROVIDER_SITE_OTHER): Payer: Medicare Other | Admitting: Medical

## 2016-10-12 VITALS — BP 114/72 | HR 72 | Temp 98.5°F | Wt 150.8 lb

## 2016-10-12 DIAGNOSIS — J209 Acute bronchitis, unspecified: Secondary | ICD-10-CM

## 2016-10-12 DIAGNOSIS — R05 Cough: Secondary | ICD-10-CM | POA: Diagnosis not present

## 2016-10-12 DIAGNOSIS — R059 Cough, unspecified: Secondary | ICD-10-CM

## 2016-10-12 LAB — CBC WITH DIFFERENTIAL/PLATELET
BASOS PCT: 0 %
Basophils Absolute: 0 cells/uL (ref 0–200)
EOS PCT: 2 %
Eosinophils Absolute: 164 cells/uL (ref 15–500)
HCT: 44.3 % (ref 35.0–45.0)
Hemoglobin: 15.2 g/dL (ref 11.7–15.5)
Lymphocytes Relative: 40 %
Lymphs Abs: 3280 cells/uL (ref 850–3900)
MCH: 29.3 pg (ref 27.0–33.0)
MCHC: 34.3 g/dL (ref 32.0–36.0)
MCV: 85.4 fL (ref 80.0–100.0)
MONOS PCT: 11 %
MPV: 10.4 fL (ref 7.5–12.5)
Monocytes Absolute: 902 cells/uL (ref 200–950)
NEUTROS ABS: 3854 {cells}/uL (ref 1500–7800)
Neutrophils Relative %: 47 %
PLATELETS: 265 10*3/uL (ref 140–400)
RBC: 5.19 MIL/uL — ABNORMAL HIGH (ref 3.80–5.10)
RDW: 13.8 % (ref 11.0–15.0)
WBC: 8.2 10*3/uL (ref 4.0–10.5)

## 2016-10-12 MED ORDER — PREDNISONE 10 MG PO TABS: ORAL_TABLET | ORAL | 0 refills | Status: DC

## 2016-10-12 MED ORDER — IPRATROPIUM-ALBUTEROL 0.5-2.5 (3) MG/3ML IN SOLN
3.0000 mL | Freq: Once | RESPIRATORY_TRACT | Status: AC
  Administered 2016-10-12: 3 mL via RESPIRATORY_TRACT

## 2016-10-12 MED ORDER — FLUTICASONE FUROATE-VILANTEROL 200-25 MCG/INH IN AEPB: 1.0000 | INHALATION_SPRAY | Freq: Every day | RESPIRATORY_TRACT | 0 refills | Status: DC

## 2016-10-12 NOTE — Progress Notes (Signed)
Subjective:  Anna Mckinney is a 66 y.o. female who presents for recheck.  I saw her last week for cough, congestion, and despite 1 round of zpak and short prednisone taper, not improved at all.   Still having cough, chest congestion, nonproductive cough, but no wheezing,no fever, no NVD, no head pressure, no rash.  no sick contacts.    Quit smoking 9 years ago.  Smoked from age 47yo to age 46yo.  No other aggravating or relieving factors.  No other c/o.  Past Medical History:  Diagnosis Date  . Barrett's esophagus     [place] (Dr. Ferdinand Lango)  . Gastritis   . GERD (gastroesophageal reflux disease)   . Hyperplastic colon polyp 11/23/2010   Dr Virgel Bouquet  . Insomnia   . Internal prolapsed hemorrhoids s/p THD hemorrhoidal ligation/pexy 10/16/2012  . Osteopenia   . PONV (postoperative nausea and vomiting)    PT'S HEART RATE DOWN TO 40 WITH HER LAST SURGERY - SHE REMEMBERS BEING ASKED TO WAKE UP SO HEART RATE WOULD GO UP - SCARED HER.  . Sleep apnea 2015   mild  . Ulcer of esophagus 10/08/2009   Dr Virgel Bouquet    ROS as in subjective   Objective: BP 114/72   Pulse 72   Temp 98.5 F (36.9 C)   Wt 150 lb 12.8 oz (68.4 kg)   SpO2 97%   BMI 25.09 kg/m   General appearance: Alert, WD/WN, no distress                             Skin: warm, no rash                           Head: +maxillary sinus tenderness,                            Eyes: conjunctiva normal, corneas clear, PERRLA                            Ears: serous effusions behind both TMs, external ear canals normal                          Nose: septum midline, turbinates swollen, with erythema and mucoid discharge             Mouth/throat: MMM, tongue normal, mild pharyngeal erythema                           Neck: supple, no adenopathy, no thyromegaly, non tender                          Heart: RRR, normal S1, S2, no murmurs                         Lungs: coarse sounds but  no wheezes, rales, or rhonchi         Assessment  Encounter Diagnoses  Name Primary?  . Cough Yes  . Acute bronchitis, unspecified organism       Plan: Gave 1 round of duoneb in office with no improvement  Will get CBC and send for chest xray.  Rest, hydrate well, gave sample of Breo inhaler and discussed  proper use.   Anna Mckinney was seen today for nasal congestion.  Diagnoses and all orders for this visit:  Cough -     ipratropium-albuterol (DUONEB) 0.5-2.5 (3) MG/3ML nebulizer solution 3 mL; Take 3 mLs by nebulization once. -     DG Chest 2 View; Future -     CBC with Differential/Platelet  Acute bronchitis, unspecified organism -     DG Chest 2 View; Future -     CBC with Differential/Platelet

## 2016-10-13 ENCOUNTER — Telehealth: Payer: Self-pay | Admitting: Family Medicine

## 2016-10-13 NOTE — Telephone Encounter (Signed)
Pt called & left message needing her lab and X-ray results.  Called pt and gave results and Shane's instructions.

## 2016-10-21 ENCOUNTER — Other Ambulatory Visit: Payer: Self-pay | Admitting: Medical

## 2016-10-21 ENCOUNTER — Telehealth: Payer: Self-pay | Admitting: Medical

## 2016-10-21 MED ORDER — FLUCONAZOLE 150 MG PO TABS: 150.0000 mg | ORAL_TABLET | Freq: Once | ORAL | 0 refills | Status: AC

## 2016-10-21 NOTE — Telephone Encounter (Signed)
Pt called and stated that she has what looks like a yeast infection on her tongue. She states it goes all the way down to her throat. She states she thinks it is coming from the antibiotics she has been on. Pt can be reached at [phone] and uses [facility] on [address].

## 2016-10-21 NOTE — Telephone Encounter (Signed)
Pt called again to see if meds were sent in for her. She said her tongue and throat is hurting bad and she need meds asap.

## 2016-11-03 ENCOUNTER — Ambulatory Visit (INDEPENDENT_AMBULATORY_CARE_PROVIDER_SITE_OTHER): Payer: Medicare Other | Admitting: Family Medicine

## 2016-11-03 ENCOUNTER — Telehealth: Payer: Self-pay | Admitting: Family Medicine

## 2016-11-03 ENCOUNTER — Other Ambulatory Visit: Payer: Self-pay

## 2016-11-03 ENCOUNTER — Encounter: Payer: Self-pay | Admitting: Family Medicine

## 2016-11-03 VITALS — BP 110/70 | HR 80 | Temp 98.4°F | Wt 153.0 lb

## 2016-11-03 DIAGNOSIS — G479 Sleep disorder, unspecified: Secondary | ICD-10-CM | POA: Diagnosis not present

## 2016-11-03 DIAGNOSIS — H6692 Otitis media, unspecified, left ear: Secondary | ICD-10-CM | POA: Diagnosis not present

## 2016-11-03 DIAGNOSIS — G4700 Insomnia, unspecified: Secondary | ICD-10-CM

## 2016-11-03 MED ORDER — ZOLPIDEM TARTRATE 5 MG PO TABS: 5.0000 mg | ORAL_TABLET | Freq: Every evening | ORAL | 0 refills | Status: DC | PRN

## 2016-11-03 MED ORDER — FLUCONAZOLE 150 MG PO TABS: 150.0000 mg | ORAL_TABLET | Freq: Every day | ORAL | 1 refills | Status: DC

## 2016-11-03 MED ORDER — AMOXICILLIN-POT CLAVULANATE 875-125 MG PO TABS: 1.0000 | ORAL_TABLET | Freq: Two times a day (BID) | ORAL | 0 refills | Status: DC

## 2016-11-03 NOTE — Telephone Encounter (Signed)
She stated that she was only using them twice per week so I did not call it in. If she is out then you need to reinforce the fact that a 30 day supply should last her several months

## 2016-11-03 NOTE — Telephone Encounter (Signed)
Patient called and wants to know if you are going to refill her Ambien Rx was not at pharmacy when she went Pt asked if someone could let her know if rx is going to be sent in

## 2016-11-03 NOTE — Progress Notes (Signed)
   Subjective:    Patient ID: Anna Mckinney, female    DOB: [birth year], 66 y.o.   MRN: [id number]  HPI She complains of a three-week history this started with sinus congestion, cough, sore throat and left-sided earache. She was seen and treated with steroids as well as antibiotics and they get better but did not get entirely over it. Is was had some dizziness with this. She also complains of a 40 year history of sleep disturbance and blames this on her daughter who she has had to take care of due to having Angelman syndrome. He has trained herself to get up quite often during the night to check on her daughter to make sure that everything is okay. She states that she would like to continue to use Ambien. She states he uses it twice per week roughly.   Review of Systems     Objective:   Physical Exam Alert and in no distress. Tympanic membrane on the left is vascular and all, right is normal canals are normal. The mucosa is slightly red with tenderness over maxillary sinuses Pharyngeal area is normal. Neck is supple without adenopathy or thyromegaly. Cardiac exam shows a regular sinus rhythm without murmurs or gallops. Lungs are clear to auscultation.        Assessment & Plan:  Left otitis media, unspecified otitis media type - Plan: amoxicillin-clavulanate (AUGMENTIN) 875-125 MG tablet, fluconazole (DIFLUCAN) 150 MG tablet  Sleep disturbance She is to call me when she finishes the antibiotic. I explained that I want to make sure she got entirely back to normal. We'll give her Diflucan in case she gets a yeast infection. The remainder the encounter was spent discussing sleep and sleep hygiene. I explained to her that sleeping is a learned behavior and she has taught herself to not sleeping get up potentially multiple times during the night. I gave her suggestions concerning thing she could do to help her daughter she doubts that she'll be able to do that. I again pushed the issue that sleep is  a learned behavior. If she is truly using this only twice per week, her present dosing regimen should last between 3 and 4 months.

## 2016-11-03 NOTE — Patient Instructions (Addendum)
Take all the antibiotic and if not totally back to normal when you finish call me and I'll give you another doseInsomnia Insomnia is a sleep disorder that makes it difficult to fall asleep or to stay asleep. Insomnia can cause tiredness (fatigue), low energy, difficulty concentrating, mood swings, and poor performance at work or school. There are three different ways to classify insomnia:  Difficulty falling asleep.  Difficulty staying asleep.  Waking up too early in the morning.  Any type of insomnia can be long-term (chronic) or short-term (acute). Both are common. Short-term insomnia usually lasts for three months or less. Chronic insomnia occurs at least three times a week for longer than three months. What are the causes? Insomnia may be caused by another condition, situation, or substance, such as:  Anxiety.  Certain medicines.  Gastroesophageal reflux disease (GERD) or other gastrointestinal conditions.  Asthma or other breathing conditions.  Restless legs syndrome, sleep apnea, or other sleep disorders.  Chronic pain.  Menopause. This may include hot flashes.  Stroke.  Abuse of alcohol, tobacco, or illegal drugs.  Depression.  Caffeine.  Neurological disorders, such as Alzheimer disease.  An overactive thyroid (hyperthyroidism).  The cause of insomnia may not be known. What increases the risk? Risk factors for insomnia include:  Gender. Women are more commonly affected than men.  Age. Insomnia is more common as you get older.  Stress. This may involve your professional or personal life.  Income. Insomnia is more common in people with lower income.  Lack of exercise.  Irregular work schedule or night shifts.  Traveling between different time zones.  What are the signs or symptoms? If you have insomnia, trouble falling asleep or trouble staying asleep is the main symptom. This may lead to other symptoms, such as:  Feeling fatigued.  Feeling nervous  about going to sleep.  Not feeling rested in the morning.  Having trouble concentrating.  Feeling irritable, anxious, or depressed.  How is this treated? Treatment for insomnia depends on the cause. If your insomnia is caused by an underlying condition, treatment will focus on addressing the condition. Treatment may also include:  Medicines to help you sleep.  Counseling or therapy.  Lifestyle adjustments.  Follow these instructions at home:  Take medicines only as directed by your health care provider.  Keep regular sleeping and waking hours. Avoid naps.  Keep a sleep diary to help you and your health care provider figure out what could be causing your insomnia. Include: ? When you sleep. ? When you wake up during the night. ? How well you sleep. ? How rested you feel the next day. ? Any side effects of medicines you are taking. ? What you eat and drink.  Make your bedroom a comfortable place where it is easy to fall asleep: ? Put up shades or special blackout curtains to block light from outside. ? Use a white noise machine to block noise. ? Keep the temperature cool.  Exercise regularly as directed by your health care provider. Avoid exercising right before bedtime.  Use relaxation techniques to manage stress. Ask your health care provider to suggest some techniques that may work well for you. These may include: ? Breathing exercises. ? Routines to release muscle tension. ? Visualizing peaceful scenes.  Cut back on alcohol, caffeinated beverages, and cigarettes, especially close to bedtime. These can disrupt your sleep.  Do not overeat or eat spicy foods right before bedtime. This can lead to digestive discomfort that can make it hard  for you to sleep.  Limit screen use before bedtime. This includes: ? Watching TV. ? Using your smartphone, tablet, and computer.  Stick to a routine. This can help you fall asleep faster. Try to do a quiet activity, brush your  teeth, and go to bed at the same time each night.  Get out of bed if you are still awake after 15 minutes of trying to sleep. Keep the lights down, but try reading or doing a quiet activity. When you feel sleepy, go back to bed.  Make sure that you drive carefully. Avoid driving if you feel very sleepy.  Keep all follow-up appointments as directed by your health care provider. This is important. Contact a health care provider if:  You are tired throughout the day or have trouble in your daily routine due to sleepiness.  You continue to have sleep problems or your sleep problems get worse. Get help right away if:  You have serious thoughts about hurting yourself or someone else. This information is not intended to replace advice given to you by your health care provider. Make sure you discuss any questions you have with your health care provider. Document Released: 02/20/2000 Document Revised: 07/25/2015 Document Reviewed: 11/23/2013 Elsevier Interactive Patient Education  [employer].

## 2016-11-04 NOTE — Telephone Encounter (Signed)
Pt states she has been taking it more often since she has been sick I have called med in per [facility]

## 2016-11-25 ENCOUNTER — Other Ambulatory Visit: Payer: Self-pay | Admitting: Family Medicine

## 2016-11-25 DIAGNOSIS — Z1231 Encounter for screening mammogram for malignant neoplasm of breast: Secondary | ICD-10-CM

## 2016-12-02 ENCOUNTER — Ambulatory Visit (INDEPENDENT_AMBULATORY_CARE_PROVIDER_SITE_OTHER): Payer: Medicare Other | Admitting: Family Medicine

## 2016-12-02 ENCOUNTER — Encounter: Payer: Self-pay | Admitting: Family Medicine

## 2016-12-02 VITALS — BP 130/80 | Ht 63.0 in | Wt 153.0 lb

## 2016-12-02 DIAGNOSIS — Z86010 Personal history of colonic polyps: Secondary | ICD-10-CM | POA: Diagnosis not present

## 2016-12-02 DIAGNOSIS — Z0000 Encounter for general adult medical examination without abnormal findings: Secondary | ICD-10-CM

## 2016-12-02 DIAGNOSIS — K2270 Barrett's esophagus without dysplasia: Secondary | ICD-10-CM | POA: Diagnosis not present

## 2016-12-02 DIAGNOSIS — Z23 Encounter for immunization: Secondary | ICD-10-CM | POA: Diagnosis not present

## 2016-12-02 DIAGNOSIS — K219 Gastro-esophageal reflux disease without esophagitis: Secondary | ICD-10-CM

## 2016-12-02 DIAGNOSIS — E785 Hyperlipidemia, unspecified: Secondary | ICD-10-CM

## 2016-12-02 DIAGNOSIS — R7301 Impaired fasting glucose: Secondary | ICD-10-CM | POA: Diagnosis not present

## 2016-12-02 DIAGNOSIS — Z87442 Personal history of urinary calculi: Secondary | ICD-10-CM | POA: Diagnosis not present

## 2016-12-02 DIAGNOSIS — Z803 Family history of malignant neoplasm of breast: Secondary | ICD-10-CM | POA: Diagnosis not present

## 2016-12-02 DIAGNOSIS — M8580 Other specified disorders of bone density and structure, unspecified site: Secondary | ICD-10-CM | POA: Diagnosis not present

## 2016-12-02 LAB — POCT URINALYSIS DIP (PROADVANTAGE DEVICE)
BILIRUBIN UA: NEGATIVE
BILIRUBIN UA: NEGATIVE mg/dL
GLUCOSE UA: NEGATIVE mg/dL
Leukocytes, UA: NEGATIVE
Nitrite, UA: NEGATIVE
PH UA: 6 (ref 5.0–8.0)
Protein Ur, POC: NEGATIVE mg/dL
RBC UA: NEGATIVE
Specific Gravity, Urine: 1.015
Urobilinogen, Ur: NEGATIVE

## 2016-12-02 NOTE — Progress Notes (Signed)
Anna Mckinney is a 66 y.o. female who presents for welcome to Medicare visit and follow-up on chronic medical conditions.  She has the following concerns:she continues to have sleep disturbance based on 40 years of getting up to help her daughter. Her daughter has Angelman syndrome. She has been have difficulty whith left lateral posterior pain and plans to see her chiropractor concerning this. Apparently this has helped in the past.She does get yearly mammograms.she has a history of osteopenia but is not interesting in another DEXA scan. The diagnosis of Barrett's esophagus was mentioned however her last EGD in 2013 showed normal mucosa. She does have a history of hyperlipidemia. She is not on any medication for that. The colonic polyps were apparently hyperplastic in nature.she also has a history of renal stones but has not had any recently.  Immunizations and Health Maintenance Immunization History  Administered Date(s) Administered  . Influenza Split 01/12/2011, 12/17/2011  . Influenza Whole 01/06/2007, 11/27/2007, 11/27/2009  . Influenza,inj,Quad PF,6+ Mos 12/14/2012, 01/03/2014, 11/18/2014, 12/02/2015  . Pneumococcal Polysaccharide-23 07/09/2005  . Td 07/09/2005  . Tdap 07/29/2011  . Zoster 10/01/2013   Health Maintenance Due  Topic Date Due  . HIV Screening  08/12/1965  . PAP SMEAR  07/26/2013  . PNA vac Low Risk Adult (1 of 2 - PCV13) 08/11/2016  . INFLUENZA VACCINE  10/06/2016    Last Pap smear: had hysterectomy Last mammogram: November 2018  Last colonoscopy: 2012 Last DEXA: 2012 Dentist: 6 months ago Ophtho:  Exercise: not much. Looking to get involved in doing Zumba  Other doctors caring for patient include: Dentist- Dr Essie Hart Eyes- Dr. Lucita Ferrara   Advanced directives:no. Information given.    Depression screen:  See questionnaire below.  Depression screen [facility] 2/9 12/02/2015 11/18/2014 10/01/2013  Decreased Interest 0 0 0  Down, Depressed, Hopeless 0 0 0  PHQ - 2  Score 0 0 0    Fall Risk Screen: see questionnaire below. Fall Risk  12/02/2015 11/18/2014  Falls in the past year? No No    ADL screen:  See questionnaire below Functional Status Survey:no difficulty with ADLs     Review of Systems Negative except as above   PHYSICAL EXAM:  General Appearance: Alert, cooperative, no distress, appears stated age Head: Normocephalic, without obvious abnormality, atraumatic Eyes: PERRL, conjunctiva/corneas clear, EOM's intact, fundi benign Ears: Normal TM's and external ear canals Nose: Nares normal, mucosa normal, no drainage or sinus tenderness Throat: Lips, mucosa, and tongue normal; teeth and gums normal Neck: Supple, no lymphadenopathy;  thyroid:  no enlargement/tenderness/nodules; no carotid bruit or JVD Lungs: Clear to auscultation bilaterally without wheezes, rales or ronchi; respirations unlabored Heart: Regular rate and rhythm, S1 and S2 normal, no murmur, rubor gallop Abdomen: Soft, non-tender, nondistended, normoactive bowel sounds,  no masses, no hepatosplenomegaly Extremities: No clubbing, cyanosis or edema Pulses: 2+ and symmetric all extremities Skin:  Skin color, texture, turgor normal, no rashes or lesions Lymph nodes: Cervical, supraclavicular, and axillary nodes normal Neurologic:  CNII-XII intact, normal strength, sensation and gait; reflexes 2+ and symmetric throughout Psych: Normal mood, affect, hygiene and grooming.  ASSESSMENT/PLAN: Welcome to Medicare preventive visit - Plan: POCT Urinalysis DIP (Proadvantage Device)  History of renal stone  Gastroesophageal reflux disease, esophagitis presence not specified  Hx of colonic polyps  Osteopenia, unspecified location  Barrett's esophagus without dysplasia - Plan: Comprehensive metabolic panel  Need for influenza vaccination - Plan: Flu vaccine HIGH DOSE PF (Fluzone High dose)  Need for vaccination against Streptococcus pneumoniae -  Plan: Pneumococcal conjugate  vaccine 13-valent  Dyslipidemia - Plan: Lipid panel  Family history of breast cancer in mother   I had a long discussion with her concerning sleep and sleep hygiene. Explained to her that sleeping is a learned behavior and she has learned to not sleep. She was really unreceptive to anyadvice I gave concerning this other than making sure she had her sleep medication.   Discussed monthly self breast exams and yearly mammograms; at least 30 minutes of aerobic activity at least 5 days/week and weight-bearing exercise 2x/week; proper sunscreen use reviewed; healthy diet, including goals of calcium and vitamin D intake and alcohol recommendations (less than or equal to 1 drink/day) reviewed; regular seatbelt use; changing batteries in smoke detectors.  Immunization recommendations discussed.  Colonoscopy recommendations reviewed   Medicare Attestation I have personally reviewed: The patient's medical and social history Their use of alcohol, tobacco or illicit drugs Their current medications and supplements The patient's functional ability including ADLs,fall risks, home safety risks, cognitive, and hearing and visual impairment Diet and physical activities Evidence for depression or mood disorders  The patient's weight, height, and BMI have been recorded in the chart.  I have made referrals, counseling, and provided education to the patient based on review of the above and I have provided the patient with a written personalized care plan for preventive services.     Wyatt Haste, MD   12/02/2016

## 2016-12-06 NOTE — Progress Notes (Signed)
Updated.

## 2016-12-07 LAB — LIPID PANEL
CHOLESTEROL: 223 mg/dL — AB (ref <200)
HDL: 39 mg/dL — ABNORMAL LOW (ref >50)
LDL Cholesterol (Calc): 154 mg/dL (calc) — ABNORMAL HIGH
Non-HDL Cholesterol (Calc): 184 mg/dL (calc) — ABNORMAL HIGH (ref <130)
TRIGLYCERIDES: 164 mg/dL — AB (ref <150)
Total CHOL/HDL Ratio: 5.7 (calc) — ABNORMAL HIGH (ref <5.0)

## 2016-12-07 LAB — COMPREHENSIVE METABOLIC PANEL
AG RATIO: 1.7 (calc) (ref 1.0–2.5)
ALT: 17 U/L (ref 6–29)
AST: 20 U/L (ref 10–35)
Albumin: 4.2 g/dL (ref 3.6–5.1)
Alkaline phosphatase (APISO): 69 U/L (ref 33–130)
BILIRUBIN TOTAL: 0.6 mg/dL (ref 0.2–1.2)
BUN: 12 mg/dL (ref 7–25)
CHLORIDE: 106 mmol/L (ref 98–110)
CO2: 25 mmol/L (ref 20–32)
Calcium: 9.9 mg/dL (ref 8.6–10.4)
Creat: 0.67 mg/dL (ref 0.50–0.99)
GLUCOSE: 110 mg/dL — AB (ref 65–99)
Globulin: 2.5 g/dL (calc) (ref 1.9–3.7)
Potassium: 4.1 mmol/L (ref 3.5–5.3)
Sodium: 141 mmol/L (ref 135–146)
Total Protein: 6.7 g/dL (ref 6.1–8.1)

## 2016-12-07 LAB — EXTRA LAV TOP TUBE

## 2016-12-07 LAB — TEST AUTHORIZATION

## 2016-12-07 LAB — HEMOGLOBIN A1C
HEMOGLOBIN A1C: 5.2 %{Hb} (ref <5.7)
MEAN PLASMA GLUCOSE: 103 (calc)
eAG (mmol/L): 5.7 (calc)

## 2017-01-06 ENCOUNTER — Ambulatory Visit
Admission: RE | Admit: 2017-01-06 | Discharge: 2017-01-06 | Disposition: A | Discharge status: Home / Self Care | Payer: Medicare Other | Source: Ambulatory Visit | Attending: Family Medicine | Admitting: Family Medicine

## 2017-01-06 DIAGNOSIS — Z1231 Encounter for screening mammogram for malignant neoplasm of breast: Secondary | ICD-10-CM | POA: Diagnosis not present

## 2017-03-09 ENCOUNTER — Telehealth: Payer: Self-pay | Admitting: Family Medicine

## 2017-03-09 DIAGNOSIS — Z86010 Personal history of colonic polyps: Secondary | ICD-10-CM

## 2017-03-09 DIAGNOSIS — E785 Hyperlipidemia, unspecified: Secondary | ICD-10-CM

## 2017-03-09 DIAGNOSIS — Z87442 Personal history of urinary calculi: Secondary | ICD-10-CM

## 2017-03-09 DIAGNOSIS — M549 Dorsalgia, unspecified: Secondary | ICD-10-CM

## 2017-03-09 NOTE — Telephone Encounter (Signed)
Called patient and advised that the orders were in for her lab work; patient would like to call back closer to time to make a nurse visit to be seen for labs.

## 2017-03-09 NOTE — Telephone Encounter (Signed)
I placed the order.

## 2017-03-09 NOTE — Telephone Encounter (Signed)
julie

## 2017-03-09 NOTE — Telephone Encounter (Signed)
Pt called and made appt for her medcheck +in sep and would like to come in for her labs prior to appt if that is ok pt can be reached at [phone]

## 2017-04-08 DIAGNOSIS — H18053 Posterior corneal pigmentations, bilateral: Secondary | ICD-10-CM | POA: Diagnosis not present

## 2017-04-08 DIAGNOSIS — H01009 Unspecified blepharitis unspecified eye, unspecified eyelid: Secondary | ICD-10-CM | POA: Diagnosis not present

## 2017-04-21 ENCOUNTER — Telehealth: Payer: Self-pay | Admitting: Family Medicine

## 2017-04-21 DIAGNOSIS — G4700 Insomnia, unspecified: Secondary | ICD-10-CM

## 2017-04-21 MED ORDER — ZOLPIDEM TARTRATE 5 MG PO TABS: 5.0000 mg | ORAL_TABLET | Freq: Every evening | ORAL | 1 refills | Status: DC | PRN

## 2017-04-21 NOTE — Addendum Note (Signed)
Addended by: Denita Lung on: 04/21/2017 03:31 PM   Modules accepted: Orders

## 2017-04-21 NOTE — Telephone Encounter (Signed)
Pt called for refills of Ambien. Please send to [facility]. Pt can be reached at [phone].

## 2017-06-14 DIAGNOSIS — D485 Neoplasm of uncertain behavior of skin: Secondary | ICD-10-CM | POA: Diagnosis not present

## 2017-06-14 DIAGNOSIS — D2239 Melanocytic nevi of other parts of face: Secondary | ICD-10-CM | POA: Diagnosis not present

## 2017-06-15 ENCOUNTER — Telehealth: Payer: Self-pay

## 2017-06-15 MED ORDER — CLARITHROMYCIN 500 MG PO TABS: 500.0000 mg | ORAL_TABLET | Freq: Two times a day (BID) | ORAL | 0 refills | Status: DC

## 2017-06-15 NOTE — Telephone Encounter (Signed)
Pt was called and made aware. [place]

## 2017-06-15 NOTE — Telephone Encounter (Signed)
I called the antibiotic in

## 2017-06-15 NOTE — Telephone Encounter (Signed)
Patient stated her daughter came in last Friday for a cold and now she has the same symptoms consisting of a low grade fever, green mucus, cough and diarrhea. She wants to know if the same antibiotic can be sent to the pharmacy for her.

## 2017-06-16 DIAGNOSIS — C44319 Basal cell carcinoma of skin of other parts of face: Secondary | ICD-10-CM | POA: Diagnosis not present

## 2017-06-16 DIAGNOSIS — D224 Melanocytic nevi of scalp and neck: Secondary | ICD-10-CM | POA: Diagnosis not present

## 2017-06-22 DIAGNOSIS — R6882 Decreased libido: Secondary | ICD-10-CM | POA: Diagnosis not present

## 2017-07-06 ENCOUNTER — Encounter: Payer: Self-pay | Admitting: Family Medicine

## 2017-10-20 ENCOUNTER — Other Ambulatory Visit: Payer: Self-pay | Admitting: Family Medicine

## 2017-10-20 DIAGNOSIS — G4700 Insomnia, unspecified: Secondary | ICD-10-CM

## 2017-10-20 NOTE — Telephone Encounter (Signed)
[facility] is requesting to fill pt zolpidem. Please advise K[facility] 

## 2017-10-21 ENCOUNTER — Encounter: Payer: Self-pay | Admitting: Medical

## 2017-10-21 ENCOUNTER — Ambulatory Visit (INDEPENDENT_AMBULATORY_CARE_PROVIDER_SITE_OTHER): Payer: Medicare Other | Admitting: Medical

## 2017-10-21 VITALS — BP 132/74 | HR 78 | Temp 97.8°F | Ht 63.0 in | Wt 149.0 lb

## 2017-10-21 DIAGNOSIS — R05 Cough: Secondary | ICD-10-CM | POA: Diagnosis not present

## 2017-10-21 DIAGNOSIS — Z87891 Personal history of nicotine dependence: Secondary | ICD-10-CM | POA: Diagnosis not present

## 2017-10-21 DIAGNOSIS — K219 Gastro-esophageal reflux disease without esophagitis: Secondary | ICD-10-CM

## 2017-10-21 DIAGNOSIS — R059 Cough, unspecified: Secondary | ICD-10-CM

## 2017-10-21 MED ORDER — PROMETHAZINE-DM 6.25-15 MG/5ML PO SYRP: 5.0000 mL | ORAL_SOLUTION | Freq: Four times a day (QID) | ORAL | 0 refills | Status: DC | PRN

## 2017-10-21 MED ORDER — ESOMEPRAZOLE MAGNESIUM 40 MG PO CPDR: 40.0000 mg | DELAYED_RELEASE_CAPSULE | Freq: Every day | ORAL | 3 refills | Status: DC

## 2017-10-21 MED ORDER — CEFUROXIME AXETIL 500 MG PO TABS
500.0000 mg | ORAL_TABLET | Freq: Two times a day (BID) | ORAL | 0 refills | Status: DC
Start: 2017-10-21 — End: 2017-11-03

## 2017-10-21 NOTE — Progress Notes (Signed)
Subjective:  Anna Mckinney is a 67 y.o. female who presents for  Chief Complaint  Patient presents with  . Cough    x 2-3 months  with scratch throat    Symptoms include cough, scratchy throat.  No ear pressure, no sinus pressure.  No fever.   Has some fatigue.   Gagging but no productive cough.  Maybe some wheezing.  Using salt water gargles, gargling with apple cider vinegar.  Using pure honey mints to calm throat.  Nonsmoker, hasn't smoked in 10 years.   No sick contacts.  Has hx/o GERD, has had some spells lately but not a lot.  Taking Nexium occasionally.  No other aggravating or relieving factors.  No other complaint.    Past Medical History:  Diagnosis Date  . Barrett's esophagus     [place] (Dr. Ferdinand Lango)  . Gastritis   . GERD (gastroesophageal reflux disease)   . Hyperplastic colon polyp 11/23/2010   Dr Virgel Bouquet  . Insomnia   . Internal prolapsed hemorrhoids s/p THD hemorrhoidal ligation/pexy 10/16/2012  . Osteopenia   . PONV (postoperative nausea and vomiting)    PT'S HEART RATE DOWN TO 40 WITH HER LAST SURGERY - SHE REMEMBERS BEING ASKED TO WAKE UP SO HEART RATE WOULD GO UP - SCARED HER.  . Sleep apnea 2015   mild  . Ulcer of esophagus 10/08/2009   Dr Virgel Bouquet    Current Outpatient Medications on File Prior to Visit  Medication Sig Dispense Refill  . acetaminophen (TYLENOL) 500 MG tablet Take 1,000 mg by mouth every 6 (six) hours as needed.    . Cholecalciferol (VITAMIN D3) 5000 UNITS TABS Take 1 tablet by mouth daily.    [name] esomeprazole (NEXIUM) 40 MG capsule Take 40 mg by mouth. Reported on 08/21/2015    . zolpidem (AMBIEN) 5 MG tablet TAKE 1 TABLET BY MOUTH AT BEDTIME IF NEEDED FOR SLEEP 30 tablet 0   No current facility-administered medications on file prior to visit.     ROS as in subjective   Objective: BP 132/74   Pulse 78   Temp 97.8 F (36.6 C)   Ht 5\' 3"  (1.6 m)   Wt 149 lb (67.6 kg)   SpO2 98%   BMI 26.39 kg/m   General appearance:  Alert, well developed, well nourished, no distress                             Skin: warm, no rash                           Head: nontender                            Eyes: conjunctiva pink, corneas clear                            Ears: TMs pearly,ear canals clear                          Nose: septum midline, turbinates swollen, with erythema and some mucoid discharge             Mouth/throat: MMM, tongue normal, mild pharyngeal erythema  Neck: supple, no adenopathy, no thyromegaly, non tender                         Lungs: somewhat bronchial sounds, otherwise no wheezes, no rales Heart: RRR   , normal s1, s2, no murmurs       Assessment  Encounter Diagnoses  Name Primary?  . Cough Yes  . Gastroesophageal reflux disease, esophagitis presence not specified   . Former smoker       Plan:  Discussed differential for chronic cough.  Advise she go for x-ray.   PFT normal.  Begin promethazine DM, begin daily Nexium for 2 weeks, avoid GERD triggers.  If fever or worse symptoms over weekend suggesting infection as discussed begin Ceftin.  otherwise f/u pending CXR.  Patient was advised to call or return if worse or not improving in the next few days.    Patient voiced understanding of diagnosis, recommendations, and treatment plan.  [name] was seen today for cough.  Diagnoses and all orders for this visit:  Cough -     Spirometry with graph  Gastroesophageal reflux disease, esophagitis presence not specified  Former smoker -     Spirometry with graph  Other orders -     esomeprazole (NEXIUM) 40 MG capsule; Take 1 capsule (40 mg total) by mouth daily. -     promethazine-dextromethorphan (PROMETHAZINE-DM) 6.25-15 MG/5ML syrup; Take 5 mLs by mouth 4 (four) times daily as needed for cough. -     cefUROXime (CEFTIN) 500 MG tablet; Take 1 tablet (500 mg total) by mouth 2 (two) times daily with a meal.

## 2017-11-02 ENCOUNTER — Ambulatory Visit
Admission: RE | Admit: 2017-11-02 | Discharge: 2017-11-02 | Disposition: A | Discharge status: Home / Self Care | Payer: Medicare Other | Source: Ambulatory Visit | Attending: Medical | Admitting: Medical

## 2017-11-02 DIAGNOSIS — R05 Cough: Secondary | ICD-10-CM

## 2017-11-02 DIAGNOSIS — R059 Cough, unspecified: Secondary | ICD-10-CM

## 2017-11-03 ENCOUNTER — Other Ambulatory Visit: Payer: Self-pay | Admitting: Medical

## 2017-11-03 MED ORDER — PREDNISONE 20 MG PO TABS: ORAL_TABLET | ORAL | 0 refills | Status: DC

## 2017-11-29 ENCOUNTER — Other Ambulatory Visit: Payer: Self-pay | Admitting: Family Medicine

## 2017-11-29 DIAGNOSIS — Z1231 Encounter for screening mammogram for malignant neoplasm of breast: Secondary | ICD-10-CM

## 2017-12-05 ENCOUNTER — Ambulatory Visit (INDEPENDENT_AMBULATORY_CARE_PROVIDER_SITE_OTHER): Payer: Medicare Other | Admitting: Family Medicine

## 2017-12-05 ENCOUNTER — Encounter: Payer: Self-pay | Admitting: Family Medicine

## 2017-12-05 VITALS — BP 142/90 | HR 68 | Temp 98.0°F | Ht 63.5 in | Wt 149.6 lb

## 2017-12-05 DIAGNOSIS — G4700 Insomnia, unspecified: Secondary | ICD-10-CM | POA: Diagnosis not present

## 2017-12-05 DIAGNOSIS — Z87442 Personal history of urinary calculi: Secondary | ICD-10-CM

## 2017-12-05 DIAGNOSIS — E785 Hyperlipidemia, unspecified: Secondary | ICD-10-CM

## 2017-12-05 DIAGNOSIS — K219 Gastro-esophageal reflux disease without esophagitis: Secondary | ICD-10-CM | POA: Diagnosis not present

## 2017-12-05 DIAGNOSIS — J209 Acute bronchitis, unspecified: Secondary | ICD-10-CM | POA: Diagnosis not present

## 2017-12-05 DIAGNOSIS — E2839 Other primary ovarian failure: Secondary | ICD-10-CM

## 2017-12-05 DIAGNOSIS — K2270 Barrett's esophagus without dysplasia: Secondary | ICD-10-CM

## 2017-12-05 DIAGNOSIS — M8580 Other specified disorders of bone density and structure, unspecified site: Secondary | ICD-10-CM | POA: Diagnosis not present

## 2017-12-05 DIAGNOSIS — Z803 Family history of malignant neoplasm of breast: Secondary | ICD-10-CM | POA: Diagnosis not present

## 2017-12-05 DIAGNOSIS — Z86010 Personal history of colonic polyps: Secondary | ICD-10-CM | POA: Diagnosis not present

## 2017-12-05 DIAGNOSIS — Z0000 Encounter for general adult medical examination without abnormal findings: Secondary | ICD-10-CM | POA: Diagnosis not present

## 2017-12-05 LAB — POCT URINALYSIS DIP (PROADVANTAGE DEVICE)
BILIRUBIN UA: NEGATIVE
Blood, UA: NEGATIVE
Glucose, UA: NEGATIVE mg/dL
Ketones, POC UA: NEGATIVE mg/dL
LEUKOCYTES UA: NEGATIVE
Nitrite, UA: NEGATIVE
PH UA: 6 (ref 5.0–8.0)
PROTEIN UA: NEGATIVE mg/dL
Specific Gravity, Urine: 1.025
Urobilinogen, Ur: 3.5

## 2017-12-05 MED ORDER — AMOXICILLIN-POT CLAVULANATE 875-125 MG PO TABS: 1.0000 | ORAL_TABLET | Freq: Two times a day (BID) | ORAL | 0 refills | Status: DC

## 2017-12-05 MED ORDER — PREDNISONE 10 MG (48) PO TBPK: ORAL_TABLET | ORAL | 0 refills | Status: DC

## 2017-12-05 NOTE — Progress Notes (Signed)
Anna Mckinney is a 67 y.o. female who presents for annual wellness visit and follow-up on chronic medical conditions.  She continues have difficulty with cough, fever, malaise and myalgias with one episode of diarrhea.  She was seen recently for this and given Ceftin.  Chest x-ray at that time was also negative.  She was given a cough medication. She continues on Nexium.  She does have a history of reflux as well as Barrett's esophagus.  She does get regular mammograms and does have a family history of breast cancer.  Immunizations and Health Maintenance Immunization History  Administered Date(s) Administered  . Influenza Split 01/12/2011, 12/17/2011  . Influenza Whole 01/06/2007, 11/27/2007, 11/27/2009  . Influenza, High Dose Seasonal PF 12/02/2016  . Influenza,inj,Quad PF,6+ Mos 12/14/2012, 01/03/2014, 11/18/2014, 12/02/2015  . Pneumococcal Conjugate-13 12/02/2016  . Pneumococcal Polysaccharide-23 07/09/2005  . Td 07/09/2005  . Tdap 07/29/2011  . Zoster 10/01/2013   Health Maintenance Due  Topic Date Due  . INFLUENZA VACCINE  10/06/2017  . PNA vac Low Risk Adult (2 of 2 - PPSV23) 12/02/2017    Last Pap smear:hystrectomy  Last mammogram:coming up 01-06-2018 Last colonoscopy:11-23-2010 Last DEXA:  Dentist:six months ago Ophtho:one year Exercise: walking   Other doctors caring for patient include:--  Advanced directives: Not discussed    Depression screen:  See questionnaire below.  Depression screen [facility] 2/9 12/05/2017 12/02/2016 12/02/2015 11/18/2014 10/01/2013  Decreased Interest 0 0 0 0 0  Down, Depressed, Hopeless 0 0 0 0 0  PHQ - 2 Score 0 0 0 0 0    Fall Risk Screen: see questionnaire below. Fall Risk  12/05/2017 12/02/2016 12/02/2015 11/18/2014  Falls in the past year? No No No No    ADL screen:  See questionnaire below Functional Status Survey: Is the patient deaf or have difficulty hearing?: No Does the patient have difficulty seeing, even when wearing  glasses/contacts?: No Does the patient have difficulty concentrating, remembering, or making decisions?: No Does the patient have difficulty walking or climbing stairs?: No Does the patient have difficulty dressing or bathing?: No Does the patient have difficulty doing errands alone such as visiting a doctor's office or shopping?: No   Review of Systems Constitutional: -, -unexpected weight change, -anorexia, -fatigue Allergy: -sneezing, -itching, -congestion Dermatology: denies changing moles, rash, lumps ENT: -runny nose, -ear pain, -sore throat,  Cardiology:  -chest pain, -palpitations, -orthopnea, Respiratory: -cough, -shortness of breath, -dyspnea on exertion, -wheezing,  Gastroenterology: -abdominal pain, -nausea, -vomiting, -diarrhea, -constipation, -dysphagia Hematology: -bleeding or bruising problems Musculoskeletal: -arthralgias, -myalgias, -joint swelling, -back pain, - Ophthalmology: -vision changes,  Urology: -dysuria, -difficulty urinating,  -urinary frequency, -urgency, incontinence Neurology: -, -numbness, , -memory loss, -falls, -dizziness    PHYSICAL EXAM:   General Appearance: Alert, cooperative, no distress, appears stated age Head: Normocephalic, without obvious abnormality, atraumatic Eyes: PERRL, conjunctiva/corneas clear, EOM's intact, fundi benign Ears: Normal TM's and external ear canals Nose: Nares normal, mucosa normal, no drainage or sinus tenderness Throat: Lips, mucosa, and tongue normal; teeth and gums normal Neck: Supple, no lymphadenopathy;  thyroid:  no enlargement/tenderness/nodules; no carotid bruit or JVD Lungs: Clear to auscultation bilaterally without wheezes, rales or ronchi; respirations unlabored Heart: Regular rate and rhythm, S1 and S2 normal, no murmur, rubor gallop Abdomen: Soft, non-tender, nondistended, normoactive bowel sounds,  no masses, no hepatosplenomegaly Extremities: No clubbing, cyanosis or edema Pulses: 2+ and symmetric  all extremities Skin:  Skin color, texture, turgor normal, no rashes or lesions Lymph nodes: Cervical, supraclavicular, and axillary nodes  normal Neurologic:  CNII-XII intact, normal strength, sensation and gait; reflexes 2+ and symmetric throughout Psych: Normal mood, affect, hygiene and grooming.  ASSESSMENT/PLAN: Routine general medical examination at a health care facility - Plan: POCT Urinalysis DIP (Proadvantage Device)  Family history of breast cancer in mother  Dyslipidemia - Plan: Lipid panel  Barrett's esophagus without dysplasia  Gastroesophageal reflux disease, esophagitis presence not specified - Plan: CBC with Differential/Platelet, Comprehensive metabolic panel  Hx of colonic polyps  History of renal stone - Plan: CBC with Differential/Platelet, Comprehensive metabolic panel  Osteopenia, unspecified location - Plan: DG Bone Density, CBC with Differential/Platelet, Comprehensive metabolic panel  Insomnia, unspecified type  Estrogen deficiency - Plan: DG Bone Density  Acute bronchitis, unspecified organism - Plan: predniSONE (STERAPRED UNI-PAK 48 TAB) 10 MG (48) TBPK tablet, amoxicillin-clavulanate (AUGMENTIN) 875-125 MG tablet  She will continue on Nexium.  Also will get routine mammograms.  She is using Ambien more sparingly.  I will place her on Augmentin and prednisone to see if we can help with this cough.  She will keep me informed as to her progress.  Discussed monthly self breast exams and yearly mammograms; at least 30 minutes of aerobic activity at least 5 days/week and weight-bearing exercise 2x/week; proper sunscreen use reviewed; healthy diet, including goals of calcium and vitamin D intake and alcohol recommendations (less than or equal to 1 drink/day) reviewed; regular seatbelt use; changing batteries in smoke detectors.  Immunization recommendations discussed.  Colonoscopy recommendations reviewed   Medicare Attestation I have personally reviewed: The  patient's medical and social history Their use of alcohol, tobacco or illicit drugs Their current medications and supplements The patient's functional ability including ADLs,fall risks, home safety risks, cognitive, and hearing and visual impairment Diet and physical activities Evidence for depression or mood disorders  The patient's weight, height, and BMI have been recorded in the chart.  I have made referrals, counseling, and provided education to the patient based on review of the above and I have provided the patient with a written personalized care plan for preventive services.     Jill Alexanders, MD   12/05/2017

## 2017-12-06 LAB — CBC WITH DIFFERENTIAL/PLATELET
BASOS ABS: 0 10*3/uL (ref 0.0–0.2)
Basos: 0 %
EOS (ABSOLUTE): 0.1 10*3/uL (ref 0.0–0.4)
EOS: 1 %
HEMATOCRIT: 47.8 % — AB (ref 34.0–46.6)
HEMOGLOBIN: 16.4 g/dL — AB (ref 11.1–15.9)
IMMATURE GRANS (ABS): 0 10*3/uL (ref 0.0–0.1)
IMMATURE GRANULOCYTES: 0 %
LYMPHS ABS: 2.3 10*3/uL (ref 0.7–3.1)
LYMPHS: 35 %
MCH: 29.3 pg (ref 26.6–33.0)
MCHC: 34.3 g/dL (ref 31.5–35.7)
MCV: 86 fL (ref 79–97)
MONOCYTES: 12 %
Monocytes Absolute: 0.8 10*3/uL (ref 0.1–0.9)
NEUTROS PCT: 52 %
Neutrophils Absolute: 3.4 10*3/uL (ref 1.4–7.0)
PLATELETS: 232 10*3/uL (ref 150–450)
RBC: 5.59 x10E6/uL — AB (ref 3.77–5.28)
RDW: 14.9 % (ref 12.3–15.4)
WBC: 6.6 10*3/uL (ref 3.4–10.8)

## 2017-12-06 LAB — LIPID PANEL
CHOLESTEROL TOTAL: 221 mg/dL — AB (ref 100–199)
Chol/HDL Ratio: 6.7 ratio — ABNORMAL HIGH (ref 0.0–4.4)
HDL: 33 mg/dL — ABNORMAL LOW (ref >39)
LDL Calculated: 142 mg/dL — ABNORMAL HIGH (ref 0–99)
Triglycerides: 229 mg/dL — ABNORMAL HIGH (ref 0–149)
VLDL Cholesterol Cal: 46 mg/dL — ABNORMAL HIGH (ref 5–40)

## 2017-12-06 LAB — COMPREHENSIVE METABOLIC PANEL
ALT: 24 IU/L (ref 0–32)
AST: 26 IU/L (ref 0–40)
Albumin/Globulin Ratio: 1.9 (ref 1.2–2.2)
Albumin: 4.6 g/dL (ref 3.6–4.8)
Alkaline Phosphatase: 101 IU/L (ref 39–117)
BUN/Creatinine Ratio: 16 (ref 12–28)
BUN: 11 mg/dL (ref 8–27)
Bilirubin Total: 0.7 mg/dL (ref 0.0–1.2)
CALCIUM: 10.5 mg/dL — AB (ref 8.7–10.3)
CO2: 23 mmol/L (ref 20–29)
CREATININE: 0.67 mg/dL (ref 0.57–1.00)
Chloride: 100 mmol/L (ref 96–106)
GFR, EST AFRICAN AMERICAN: 106 mL/min/{1.73_m2} (ref >59)
GFR, EST NON AFRICAN AMERICAN: 92 mL/min/{1.73_m2} (ref >59)
Globulin, Total: 2.4 g/dL (ref 1.5–4.5)
Glucose: 100 mg/dL — ABNORMAL HIGH (ref 65–99)
Potassium: 4.3 mmol/L (ref 3.5–5.2)
Sodium: 139 mmol/L (ref 134–144)
TOTAL PROTEIN: 7 g/dL (ref 6.0–8.5)

## 2017-12-16 ENCOUNTER — Other Ambulatory Visit: Payer: Self-pay | Admitting: Medical

## 2017-12-16 ENCOUNTER — Telehealth: Payer: Self-pay | Admitting: Family Medicine

## 2017-12-16 DIAGNOSIS — M62830 Muscle spasm of back: Secondary | ICD-10-CM

## 2017-12-16 MED ORDER — CYCLOBENZAPRINE HCL 10 MG PO TABS: 5.0000 mg | ORAL_TABLET | Freq: Three times a day (TID) | ORAL | 0 refills | Status: DC | PRN

## 2017-12-16 NOTE — Telephone Encounter (Signed)
PT states she had an instance of bad stomach pain and urge to use the bathroom and slight low back pain this morning. She kept having to go to the bathroom due to the bad stomach pain. PT said she had this same pains and bathroom issues before and Dr. Redmond School gave her Flomax to calm down everything. Pt is not sure but she think maybe she was given a few muscle relaxers as well. Pt wants refill on Flomax to help her through the weekend as well as muscle relaxers if provider thinks those as needed.

## 2017-12-19 ENCOUNTER — Telehealth: Payer: Self-pay | Admitting: Family Medicine

## 2017-12-19 ENCOUNTER — Telehealth: Payer: Self-pay

## 2017-12-19 NOTE — Telephone Encounter (Signed)
Please advise if pt can have a script for flo max. Please advise. [place]

## 2017-12-19 NOTE — Telephone Encounter (Signed)
Called pt back to advise Dr. Redmond School would like to have her make an appt. Thanks [employer]

## 2017-12-19 NOTE — Telephone Encounter (Signed)
I would need to see her to see what we are treating

## 2017-12-19 NOTE — Telephone Encounter (Signed)
I sent muscle relaxer, but not sure about Flomax.   This is usually used for urinary retention or prostate issues in males.    Consider office visit, may need to screen for urinary tract infection

## 2017-12-19 NOTE — Telephone Encounter (Signed)
Pt called and states that she needs some mouth was, she states she has the thrush, states she got it from taking the steroids she was on, states her tongue is red and mouth is raw,  And throat is sore, tried to make pt appt and she did not want to come in, pt uses [id number] - [place], [place] and pt can be reached at [phone]

## 2017-12-19 NOTE — Telephone Encounter (Signed)
Will make the appt and think it was for a bladder issue. [place]

## 2017-12-20 MED ORDER — FLUCONAZOLE 150 MG PO TABS: 150.0000 mg | ORAL_TABLET | Freq: Every day | ORAL | 0 refills | Status: DC

## 2017-12-20 NOTE — Telephone Encounter (Signed)
The Diflucan is a much better way to treat this 1 pill/day 2 days

## 2017-12-20 NOTE — Telephone Encounter (Signed)
Pt says she would just like the mouth wash. Her mouth is sore and red. Pt says her stomach was upset and would not like to take another abx at this time [facility]

## 2017-12-20 NOTE — Telephone Encounter (Signed)
Let her know that I called the medication and and have her take it for 2 days which should probably take care of it.  If it does not she will need an appoint

## 2017-12-21 NOTE — Telephone Encounter (Signed)
Pt was advised and has agreed to take med . Pt will be in Thursday for flu shots. [place]

## 2017-12-22 ENCOUNTER — Other Ambulatory Visit (INDEPENDENT_AMBULATORY_CARE_PROVIDER_SITE_OTHER): Payer: Medicare Other

## 2017-12-22 DIAGNOSIS — Z23 Encounter for immunization: Secondary | ICD-10-CM | POA: Diagnosis not present

## 2018-01-09 ENCOUNTER — Ambulatory Visit: Payer: Medicare Other

## 2018-01-12 DIAGNOSIS — H1033 Unspecified acute conjunctivitis, bilateral: Secondary | ICD-10-CM | POA: Diagnosis not present

## 2018-01-17 DIAGNOSIS — H1031 Unspecified acute conjunctivitis, right eye: Secondary | ICD-10-CM | POA: Diagnosis not present

## 2018-01-26 ENCOUNTER — Encounter: Payer: Self-pay | Admitting: Medical

## 2018-01-26 ENCOUNTER — Ambulatory Visit (INDEPENDENT_AMBULATORY_CARE_PROVIDER_SITE_OTHER): Payer: Medicare Other | Admitting: Medical

## 2018-01-26 VITALS — BP 122/72 | HR 72 | Temp 98.4°F | Resp 18 | Ht 63.25 in | Wt 153.2 lb

## 2018-01-26 DIAGNOSIS — R309 Painful micturition, unspecified: Secondary | ICD-10-CM | POA: Diagnosis not present

## 2018-01-26 DIAGNOSIS — N3001 Acute cystitis with hematuria: Secondary | ICD-10-CM | POA: Diagnosis not present

## 2018-01-26 LAB — POCT URINALYSIS DIP (PROADVANTAGE DEVICE)
BILIRUBIN UA: NEGATIVE
BILIRUBIN UA: NEGATIVE mg/dL
GLUCOSE UA: NEGATIVE mg/dL
Nitrite, UA: NEGATIVE
Protein Ur, POC: NEGATIVE mg/dL
SPECIFIC GRAVITY, URINE: 1.01
Urobilinogen, Ur: NEGATIVE
pH, UA: 6 (ref 5.0–8.0)

## 2018-01-26 MED ORDER — NITROFURANTOIN MONOHYD MACRO 100 MG PO CAPS: 100.0000 mg | ORAL_CAPSULE | Freq: Two times a day (BID) | ORAL | 0 refills | Status: DC

## 2018-01-26 MED ORDER — PHENAZOPYRIDINE HCL 100 MG PO TABS: 100.0000 mg | ORAL_TABLET | Freq: Three times a day (TID) | ORAL | 0 refills | Status: DC | PRN

## 2018-01-26 NOTE — Progress Notes (Signed)
Subjective:   Anna Mckinney is a 67 y.o. female who complains of possible urinary tract infection.  She reports a 2-day history of urinary pressure, spot of blood, urinary frequency, some nausea.  No back pain, no fever, no vomiting or diarrhea. Patient does not have a history of recurrent UTI. Patient does not have a history of pyelonephritis.    No other aggravating or relieving factors.  No other c/o.  Past Medical History:  Diagnosis Date  . Barrett's esophagus     [place] (Dr. Ferdinand Lango)  . Gastritis   . GERD (gastroesophageal reflux disease)   . Hyperplastic colon polyp 11/23/2010   Dr Virgel Bouquet  . Insomnia   . Internal prolapsed hemorrhoids s/p THD hemorrhoidal ligation/pexy 10/16/2012  . Osteopenia   . PONV (postoperative nausea and vomiting)    PT'S HEART RATE DOWN TO 40 WITH HER LAST SURGERY - SHE REMEMBERS BEING ASKED TO WAKE UP SO HEART RATE WOULD GO UP - SCARED HER.  . Sleep apnea 2015   mild  . Ulcer of esophagus 10/08/2009   Dr Virgel Bouquet    Current Outpatient Medications on File Prior to Visit  Medication Sig Dispense Refill  . Cholecalciferol (VITAMIN D3) 5000 UNITS TABS Take 1 tablet by mouth daily.    [name] acetaminophen (TYLENOL) 500 MG tablet Take 1,000 mg by mouth every 6 (six) hours as needed.    . cyclobenzaprine (FLEXERIL) 10 MG tablet Take 0.5-1 tablets (5-10 mg total) by mouth 3 (three) times daily as needed for muscle spasms. (Patient not taking: Reported on 01/26/2018) 10 tablet 0  . esomeprazole (NEXIUM) 40 MG capsule Take 40 mg by mouth. Reported on 08/21/2015    . zolpidem (AMBIEN) 5 MG tablet TAKE 1 TABLET BY MOUTH AT BEDTIME IF NEEDED FOR SLEEP (Patient not taking: Reported on 01/26/2018) 30 tablet 0   No current facility-administered medications on file prior to visit.     ROS as in subjective  Reviewed allergies, medications, past medical, surgical, and social history.     Objective: BP 122/72   Pulse 72   Temp 98.4 F (36.9 C)  (Tympanic)   Resp 18   Ht 5' 3.25" (1.607 m)   Wt 153 lb 3.2 oz (69.5 kg)   BMI 26.92 kg/m   General appearance: alert, no distress, WD/WN, female Abdomen: +bs, soft,+ mild suprapubic tenderness, otherwise non tender, non distended, no masses, no hepatomegaly, no splenomegaly, no bruits Back: no CVA tenderness GU: deferred      Assessment: Encounter Diagnoses  Name Primary?  . Pain with urination Yes  . Acute cystitis with hematuria      Plan: Discussed symptoms, diagnosis or urinary tract infection, possible complications, and usual course of illness.  Begin medication Macrobid, Pyridium as needed.  Advised increased water intake, can use OTC  Tylenol for pain prn.  Call or return if worse or not improving in the next 3-4 days.

## 2018-01-31 ENCOUNTER — Ambulatory Visit
Admission: RE | Admit: 2018-01-31 | Discharge: 2018-01-31 | Disposition: A | Discharge status: Home / Self Care | Payer: Medicare Other | Source: Ambulatory Visit | Attending: Family Medicine | Admitting: Family Medicine

## 2018-01-31 DIAGNOSIS — Z780 Asymptomatic menopausal state: Secondary | ICD-10-CM | POA: Diagnosis not present

## 2018-01-31 DIAGNOSIS — Z1231 Encounter for screening mammogram for malignant neoplasm of breast: Secondary | ICD-10-CM

## 2018-01-31 DIAGNOSIS — M8589 Other specified disorders of bone density and structure, multiple sites: Secondary | ICD-10-CM | POA: Diagnosis not present

## 2018-02-24 ENCOUNTER — Telehealth: Payer: Self-pay | Admitting: Family Medicine

## 2018-02-24 DIAGNOSIS — G4700 Insomnia, unspecified: Secondary | ICD-10-CM

## 2018-02-24 NOTE — Telephone Encounter (Signed)
Pt called for refills of ambien, please send to [facility] [address]. Pt can be reached at [phone].

## 2018-02-26 MED ORDER — ZOLPIDEM TARTRATE 5 MG PO TABS: ORAL_TABLET | ORAL | 1 refills | Status: DC

## 2018-03-02 ENCOUNTER — Ambulatory Visit (INDEPENDENT_AMBULATORY_CARE_PROVIDER_SITE_OTHER): Payer: Medicare Other | Admitting: Medical

## 2018-03-02 ENCOUNTER — Ambulatory Visit
Admission: RE | Admit: 2018-03-02 | Discharge: 2018-03-02 | Disposition: A | Discharge status: Home / Self Care | Payer: Medicare Other | Source: Ambulatory Visit | Attending: Medical | Admitting: Medical

## 2018-03-02 VITALS — BP 130/80 | HR 57 | Temp 97.8°F | Resp 16 | Ht 63.0 in | Wt 150.4 lb

## 2018-03-02 DIAGNOSIS — M25521 Pain in right elbow: Secondary | ICD-10-CM | POA: Diagnosis not present

## 2018-03-02 DIAGNOSIS — W19XXXA Unspecified fall, initial encounter: Secondary | ICD-10-CM

## 2018-03-02 DIAGNOSIS — S59901A Unspecified injury of right elbow, initial encounter: Secondary | ICD-10-CM | POA: Diagnosis not present

## 2018-03-02 DIAGNOSIS — M25421 Effusion, right elbow: Secondary | ICD-10-CM

## 2018-03-02 NOTE — Progress Notes (Signed)
Subjective: Chief Complaint  Patient presents with  . fall    right arm injury from fall X 3 weeks   Here for injury 3 weeks ago.   Was in her niece's house recently, and tripped over threshold step doing going into bathroom.  End up falling frontward.  Landed on right hand and elbow, more to right side.   Initially had swelling in right hand, but this improved.   Still has pain in right elbow with some swelling, as well as some pain in right shoulder.  denies LOC, no head injury, no other pain or injury.   Mainly has pain in right arm.  No other aggravating or relieving factors. No other complaint.  Past Medical History:  Diagnosis Date  . Barrett's esophagus     [place] (Dr. Ferdinand Lango)  . Gastritis   . GERD (gastroesophageal reflux disease)   . Hyperplastic colon polyp 11/23/2010   Dr Virgel Bouquet  . Insomnia   . Internal prolapsed hemorrhoids s/p THD hemorrhoidal ligation/pexy 10/16/2012  . Osteopenia   . PONV (postoperative nausea and vomiting)    PT'S HEART RATE DOWN TO 40 WITH HER LAST SURGERY - SHE REMEMBERS BEING ASKED TO WAKE UP SO HEART RATE WOULD GO UP - SCARED HER.  . Sleep apnea 2015   mild  . Ulcer of esophagus 10/08/2009   Dr Virgel Bouquet   Current Outpatient Medications on File Prior to Visit  Medication Sig Dispense Refill  . acetaminophen (TYLENOL) 500 MG tablet Take 1,000 mg by mouth every 6 (six) hours as needed.    . Cholecalciferol (VITAMIN D3) 5000 UNITS TABS Take 1 tablet by mouth daily.    [name] esomeprazole (NEXIUM) 40 MG capsule Take 40 mg by mouth. Reported on 08/21/2015    . cyclobenzaprine (FLEXERIL) 10 MG tablet Take 0.5-1 tablets (5-10 mg total) by mouth 3 (three) times daily as needed for muscle spasms. (Patient not taking: Reported on 01/26/2018) 10 tablet 0  . zolpidem (AMBIEN) 5 MG tablet TAKE 1 TABLET BY MOUTH AT BEDTIME IF NEEDED FOR SLEEP (Patient not taking: Reported on 03/02/2018) 30 tablet 1   No current facility-administered medications on file  prior to visit.    ROS as in subjective   Objective; BP 130/80   Pulse (!) 57   Temp 97.8 F (36.6 C) (Oral)   Resp 16   Ht 5\' 3"  (1.6 m)   Wt 150 lb 6.4 oz (68.2 kg)   SpO2 97%   BMI 26.64 kg/m   Gen: wd, wn, nad Skin: Unremarkable There is a subtle obvious swelling over the right elbow over the proximal radial head, tender over the same area, pain noted with elbow range of motion in the same area, she is slightly weaker on the right elbow range of motion than the left.  Otherwise non tender, no other welling no deformity otherwise Arms neurovascularly intact No other injury noted about her body    Assessment: Encounter Diagnoses  Name Primary?  . Right elbow pain Yes  . Elbow swelling, right   . Fall, initial encounter     Plan: We discussed her symptoms and concerns, exam findings.  We will send for x-ray.  Addendum: I reviewed the x-ray with her over the phone, no fracture.  Symptoms and exam suggest bruising of the right elbow from her fall.  I advise she use a combination of alternating ice water bath and warm towel 15 minutes each twice a day, over-the-counter ibuprofen twice daily, arm  sling over-the-counter for 1 to 2 hours at a time several times a day for the next few days.  Advise she call back within 5 to 7 days to give me an update on symptoms that should be much improved at that point.  M[place]ynn was seen today for fall.  Diagnoses and all orders for this visit:  Right elbow pain -     DG Elbow Complete Right; Future  Elbow swelling, right -     DG Elbow Complete Right; Future  Fall, initial encounter

## 2018-09-15 ENCOUNTER — Other Ambulatory Visit: Payer: Self-pay

## 2018-09-15 ENCOUNTER — Ambulatory Visit (INDEPENDENT_AMBULATORY_CARE_PROVIDER_SITE_OTHER): Payer: Medicare Other | Admitting: Family Medicine

## 2018-09-15 ENCOUNTER — Encounter: Payer: Self-pay | Admitting: Family Medicine

## 2018-09-15 VITALS — Temp 99.9°F | Wt 148.0 lb

## 2018-09-15 DIAGNOSIS — J069 Acute upper respiratory infection, unspecified: Secondary | ICD-10-CM

## 2018-09-15 DIAGNOSIS — B9789 Other viral agents as the cause of diseases classified elsewhere: Secondary | ICD-10-CM

## 2018-09-15 DIAGNOSIS — G4700 Insomnia, unspecified: Secondary | ICD-10-CM | POA: Diagnosis not present

## 2018-09-15 MED ORDER — ZOLPIDEM TARTRATE 5 MG PO TABS: ORAL_TABLET | ORAL | 1 refills | Status: DC

## 2018-09-15 NOTE — Progress Notes (Signed)
   Subjective:    Patient ID: Anna Mckinney, female    DOB: May 19, 1950, 68 y.o.   MRN: [id number]  HPI Documentation for virtual telephone encounter.  Documentation for virtual audio and video telecommunications through [place] encounter: The patient was located at home. The provider was located in the office. The patient did consent to this visit and is aware of possible charges through their insurance for this visit. The other persons participating in this telemedicine service were none. Time spent on call was 5 minutes and in review of previous records >10 minutes total. This virtual service is not related to other E/M service within previous 7 days. She states she has had difficulty with malaise for well over a week but then last Saturday that got worse as well as a slight fever, sore throat, coughing.  She states today she is feeling better.  Her husband and daughter both had similar symptoms over the same timeframe.  She has had difficulty with coughing at night but unfortunately cannot take codeine due to nausea, Tessalon does not work and she does not tolerate NyQuil causing nausea as well.   Review of Systems     Objective:   Physical Exam Alert and in no distress otherwise not examined       Assessment & Plan:  Viral URI with cough - Plan: She will continue to treat the viral illness as she has been.  Discussed the fact that this could be COVID however at this point she is not interested in further testing.  She her husband and daughter stay fairly sequestered.  Insomnia, unspecified type - Plan: zolpidem (AMBIEN) 5 MG tablet, I will give her Ambien to help with sleep

## 2018-11-07 ENCOUNTER — Other Ambulatory Visit: Payer: Self-pay

## 2018-11-07 ENCOUNTER — Ambulatory Visit (INDEPENDENT_AMBULATORY_CARE_PROVIDER_SITE_OTHER): Payer: Medicare Other | Admitting: Family Medicine

## 2018-11-07 VITALS — BP 127/79 | HR 57 | Temp 98.6°F | Wt 148.0 lb

## 2018-11-07 DIAGNOSIS — K219 Gastro-esophageal reflux disease without esophagitis: Secondary | ICD-10-CM

## 2018-11-07 NOTE — Progress Notes (Signed)
   Subjective:    Patient ID: Anna Mckinney, female    DOB: [birth year], 68 y.o.   MRN: [id number]  HPI Documentation for virtual telephone encounter. Documentation for virtual audio and video telecommunications through [place] encounter: The patient was located at home. The provider was located in the office. The patient did consent to this visit and is aware of possible charges through their insurance for this visit.  The other persons participating in this telemedicine service were none. Time spent on call was 5 minutes and in review of previous records >15 minutes total.  This virtual service is not related to other E/M service within previous 7 days. She complains of intermittent difficulty with a tight feeling in her throat as well as burning sensation that also causes some spasm.  She also complains of some postnasal drainage.  She says that this pattern is relieved when she drinks water.  She does have underlying GERD and uses Nexium but states that that symptom is slightly different than the one she is now having.    Review of Systems     Objective:   Physical Exam Alert and in no distress otherwise not examined      Assessment & Plan:  Gastroesophageal reflux disease, esophagitis presence not specified - Plan: Ambulatory referral to Gastroenterology I think her symptoms are still GERD related but she is on Nexium and I will therefore get input from gastroenterology.

## 2018-11-08 ENCOUNTER — Encounter: Payer: Self-pay | Admitting: Gastroenterology

## 2018-12-12 ENCOUNTER — Encounter: Payer: Medicare Other | Admitting: Gastroenterology

## 2018-12-14 ENCOUNTER — Ambulatory Visit (INDEPENDENT_AMBULATORY_CARE_PROVIDER_SITE_OTHER): Payer: Medicare Other | Admitting: Gastroenterology

## 2018-12-14 ENCOUNTER — Encounter: Payer: Self-pay | Admitting: Gastroenterology

## 2018-12-14 ENCOUNTER — Other Ambulatory Visit: Payer: Self-pay

## 2018-12-14 VITALS — BP 116/72 | HR 69 | Temp 97.9°F | Ht 64.0 in | Wt 150.5 lb

## 2018-12-14 DIAGNOSIS — Z86010 Personal history of colonic polyps: Secondary | ICD-10-CM | POA: Diagnosis not present

## 2018-12-14 DIAGNOSIS — K219 Gastro-esophageal reflux disease without esophagitis: Secondary | ICD-10-CM

## 2018-12-14 MED ORDER — PLENVU 140 G PO SOLR: 1.0000 | ORAL | 0 refills | Status: DC

## 2018-12-14 NOTE — Progress Notes (Signed)
Referring Provider: Denita Lung, MD Primary Care Physician:  Denita Lung, MD  Reason for Consultation: GERD   IMPRESSION:  GERD with persistent symptoms despite Nexium  History of colon polyps History of esophageal ulcer ? Barrett's esophagus  Recent symptoms sound most consistent with reflux esophagitis +/- LPR. Unclear history of Barrett's esophagus given her history of esophagitis and esophageal ulcer. Pathology findings may have been an overread in the setting of inflammation. EGD with Barrett's biopsies recommended. I've recommended daily PPI therapy between now and her endoscopic evaluation.  History of hyperplastic polyps in the transverse colon in 2013. Given the location, they may actually be sessile serrated polyps. Colonoscopy recommended.     PLAN: Take Nexium 40 mg daily Colonoscopy EGD  Please see the "Patient Instructions" section for addition details about the plan.  HPI: Anna Mckinney is a 68 y.o. female referred by Dr. Redmond School for further evaluation of reflux.  The history is obtained from the patient and review of her electronic health record. I performed her husband's colonoscopy.  She had a cholecystectomy for chronic cholecystitis and cholelithiasis 05/09/2008. She has a history of duodenal ulcer.  Previously followed by Dr. Carlean Purl. Had a colonoscopy scheduled with me earlier this week that she cancelled at the last minute due to concerns about the flu. Requested an office visit prior to endoscopy due to concerns for trouble in her throat.   She complains of intermittent difficulty with a tight feeling in her throat as well as burning sensation that also causes some spasm. Associated cough and dysphonia. Temporally associated to the use of prednisone. No dysphonia, odynophagia, or dysphagia.  She also complains of some postnasal drainage.  She says that this pattern is relieved when she drinks water.  She does have underlying GERD and uses Nexium but  states that that symptom is slightly different than the one she is now having. Is not using the Nexium every day, she only uses it when she is feeling reflux.   Esophageal biopsies from 2010 show reflux and a distal esophageal ulcer.  There was no intestinal metaplasia. And EGD in 2011 showed intestinal metaplasia. Last EGD in 2013 with Dr. Ferdinand Lango showed no intestinal metaplasia.   Colonoscopy with Dr. Virgel Bouquet 11/22/2008 show that 4 polyps were removed.  The pathology results are unclear. If hyperplastic, may have been sessile serrated polyps.  Dr. Ferdinand Lango noted that the prep was inadequate. Repeat colonoscopy in 2013 showed "inadequate prep" with multiple hyperplastic polyps removed including some from the transverse colon.   Father with stomach ulcer. Maternal grandmother died of stomach cancer at age 57.  No other known family history of colon cancer or polyps. No family history of uterine/endometrial cancer, pancreatic cancer or gastric/stomach cancer.   Past Medical History:  Diagnosis Date  . Barrett's esophagus     [place] (Dr. Ferdinand Lango)  . Gastritis   . GERD (gastroesophageal reflux disease)   . Hemorrhoid   . Hyperplastic colon polyp 11/23/2010   Dr Virgel Bouquet  . Insomnia   . Internal prolapsed hemorrhoids s/p THD hemorrhoidal ligation/pexy 10/16/2012  . Osteopenia   . PONV (postoperative nausea and vomiting)    PT'S HEART RATE DOWN TO 40 WITH HER LAST SURGERY - SHE REMEMBERS BEING ASKED TO WAKE UP SO HEART RATE WOULD GO UP - SCARED HER.  . Rectal prolapse   . Sleep apnea 2015   mild  . Ulcer of esophagus 10/08/2009   Dr Virgel Bouquet  Past Surgical History:  Procedure Laterality Date  . ABDOMINAL HYSTERECTOMY  1984   1 ovary remains; removed for "cancer cells"  . CATARACT EXTRACTION Bilateral end of 2015 & early 2016   Dr. Lucita Ferrara  . CHOLECYSTECTOMY  2010  . COLONOSCOPY W/ BIOPSIES    . ESOPHAGOGASTRODUODENOSCOPY    . EVALUATION UNDER ANESTHESIA WITH  HEMORRHOIDECTOMY N/A 01/25/2013   Procedure: EXAM UNDER ANESTHESIA WITH HEMORRHOIDECTOMY;  Surgeon: Adin Hector, MD;  Location: [facility];  Service: General;  Laterality: N/A;  . posterolateral external hemorrhoidactomy  10/20/2012   left  . SKIN BIOPSY Left    see chart.  Skin biopsy rontal let scalp  . TRANSANAL HEMORRHOIDAL DEARTERIALIZATION  10/20/2012   hemorrhoidal ligation and pexy.    Current Outpatient Medications  Medication Sig Dispense Refill  . acetaminophen (TYLENOL) 500 MG tablet Take 1,000 mg by mouth every 6 (six) hours as needed.    . Ascorbic Acid (VITAMIN C ER PO) Take by mouth.    . Cholecalciferol (VITAMIN D3) 5000 UNITS TABS Take 1 tablet by mouth daily.    . cyclobenzaprine (FLEXERIL) 10 MG tablet Take 0.5-1 tablets (5-10 mg total) by mouth 3 (three) times daily as needed for muscle spasms. (Patient taking differently: Take 5-10 mg by mouth as needed for muscle spasms. ) 10 tablet 0  . esomeprazole (NEXIUM) 40 MG capsule Take 40 mg by mouth as needed.     . Probiotic Product (PRO-BIOTIC BLEND PO) Take by mouth.    . zolpidem (AMBIEN) 5 MG tablet TAKE 1 TABLET BY MOUTH AT BEDTIME IF NEEDED FOR SLEEP 30 tablet 1  . PEG-KCl-NaCl-NaSulf-Na Asc-C (PLENVU) 140 g SOLR Take 1 kit by mouth as directed. 1 each 0   No current facility-administered medications for this visit.     Allergies as of 12/14/2018 - Review Complete 12/14/2018  Allergen Reaction Noted  . Hydrocodone Itching and Nausea And Vomiting 01/25/2013  . Codeine    . Doxycycline Other (See Comments) 01/03/2014    Family History  Problem Relation Age of Onset  . Breast cancer Mother 21  . Cancer Mother        breast cancer  . COPD Father   . Heart disease Father        CHF  . Atrial fibrillation Father   . Angelman syndrome Daughter   . Seizures Daughter   . Thyroid disease Sister   . Diabetes Sister        diet controlled  . Arthritis Sister        rheumatoid  . Hyperlipidemia Brother   .  Colon cancer Sister 57  . Cancer Paternal Grandmother        stomach  . Esophageal cancer Neg Hx     Social History   Socioeconomic History  . Marital status: Married    Spouse name: Not on file  . Number of children: 2  . Years of education: Not on file  . Highest education level: Not on file  Occupational History  . 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 Financial resource strain: Not on file  . Food insecurity    Worry: Not on file    Inability: Not on file  . Transportation needs    Medical: Not on file    Non-medical: Not on file  Tobacco Use  . Smoking status: Former Smoker    Packs/day: 0.30    Years: 20.00    Pack years: 6.00    Types:  Cigarettes    Quit date: 01/24/2009    Years since quitting: 9.8  . Smokeless tobacco: Never Used  Substance and Sexual Activity  . Alcohol use: No    Alcohol/week: 0.0 standard drinks  . Drug use: No  . Sexual activity: Not Currently  Lifestyle  . Physical activity    Days per week: Not on file    Minutes per session: Not on file  . Stress: Not on file  Relationships  . Social Herbalist on phone: Not on file    Gets together: Not on file    Attends religious service: Not on file    Active member of club or organization: Not on file    Attends meetings of clubs or organizations: Not on file    Relationship status: Not on file  . Intimate partner violence    Fear of current or ex partner: Not on file    Emotionally abused: Not on file    Physically abused: Not on file    Forced sexual activity: Not on file  Other Topics Concern  . Not on file  Social History Narrative   Lives with husband, daughter (with Angelman [place] is her caregiver) and 1 dog (boxer)    Review of Systems: 12 system ROS is negative except as noted above with the additions of back pain, cough, and occasional night sweats.   Physical Exam: General:   Alert,  well-nourished, pleasant and cooperative in NAD  Head:  Normocephalic and atraumatic. Eyes:  Sclera clear, no icterus.   Conjunctiva pink. Ears:  Normal auditory acuity. Nose:  No deformity, discharge,  or lesions. Mouth:  No deformity or lesions.   Neck:  Supple; no masses or thyromegaly. Lungs:  Clear throughout to auscultation.   No wheezes. Heart:  Regular rate and rhythm; no murmurs. Abdomen:  Soft, nontender, nondistended, normal bowel sounds, no rebound or guarding. No hepatosplenomegaly.   Rectal:  Deferred  Msk:  Symmetrical. No boney deformities LAD: No inguinal or umbilical LAD Extremities:  No clubbing or edema. Neurologic:  Alert and  oriented x4;  grossly nonfocal Skin:  Intact without significant lesions or rashes. Psych:  Alert and cooperative. Normal mood and affect.    Anna Holbein L. Tarri Glenn, MD, MPH 12/14/2018, 7:37 PM

## 2018-12-14 NOTE — Patient Instructions (Signed)
I recommend that you take the Nexium 40 mg every morning as your throat tightening may be due to reflux.  It is also time for a colonoscopy.  Recommendations to manage any reflux in addition to medication: - Take your Nexium 30 minutes before meals - Avoid any dietary triggers - Avoid spicy and acidic foods - Limit your intake of coffee, tea, alcohol, and carbonated drinks - Work to maintain a healthy weight - Keep the head of the bed elevated with blocks if you are having any nighttime symptoms - Stay upright for 2 hours after eating - Avoid meals and snacks three to four hours before bedtime   Please call with any questions or concerns prior to your endoscopy.

## 2018-12-22 ENCOUNTER — Telehealth: Payer: Self-pay | Admitting: Gastroenterology

## 2018-12-22 NOTE — Telephone Encounter (Signed)
[facility] pharmacy informed that they are unable to order Plenvu and requested to change script to [facility].

## 2018-12-22 NOTE — Telephone Encounter (Signed)
Spoke with patient and informed her that she can come to the office and get a plenvu sample. She verbalized understanding.

## 2018-12-25 ENCOUNTER — Other Ambulatory Visit (INDEPENDENT_AMBULATORY_CARE_PROVIDER_SITE_OTHER): Payer: Medicare Other

## 2018-12-25 DIAGNOSIS — Z23 Encounter for immunization: Secondary | ICD-10-CM

## 2018-12-27 ENCOUNTER — Other Ambulatory Visit: Payer: Self-pay | Admitting: Family Medicine

## 2018-12-27 DIAGNOSIS — Z1231 Encounter for screening mammogram for malignant neoplasm of breast: Secondary | ICD-10-CM

## 2019-01-01 ENCOUNTER — Telehealth: Payer: Self-pay

## 2019-01-01 NOTE — Telephone Encounter (Signed)
Covid-19 screening questions   Do you now or have you had a fever in the last 14 days? NO   Do you have any respiratory symptoms of shortness of breath or cough now or in the last 14 days? NO  Do you have any family members or close contacts with diagnosed or suspected Covid-19 in the past 14 days? NO  Have you been tested for Covid-19 and found to be positive? NO        

## 2019-01-02 ENCOUNTER — Other Ambulatory Visit: Payer: Self-pay

## 2019-01-02 ENCOUNTER — Encounter: Payer: Self-pay | Admitting: Gastroenterology

## 2019-01-02 ENCOUNTER — Ambulatory Visit (AMBULATORY_SURGERY_CENTER): Payer: Medicare Other | Admitting: Gastroenterology

## 2019-01-02 VITALS — BP 154/82 | HR 82 | Temp 97.9°F | Resp 21 | Ht 64.0 in | Wt 150.0 lb

## 2019-01-02 DIAGNOSIS — K219 Gastro-esophageal reflux disease without esophagitis: Secondary | ICD-10-CM | POA: Diagnosis not present

## 2019-01-02 DIAGNOSIS — K3189 Other diseases of stomach and duodenum: Secondary | ICD-10-CM

## 2019-01-02 DIAGNOSIS — K2950 Unspecified chronic gastritis without bleeding: Secondary | ICD-10-CM | POA: Diagnosis not present

## 2019-01-02 DIAGNOSIS — K449 Diaphragmatic hernia without obstruction or gangrene: Secondary | ICD-10-CM | POA: Diagnosis not present

## 2019-01-02 DIAGNOSIS — Z86010 Personal history of colonic polyps: Secondary | ICD-10-CM

## 2019-01-02 DIAGNOSIS — K2210 Ulcer of esophagus without bleeding: Secondary | ICD-10-CM

## 2019-01-02 DIAGNOSIS — K2270 Barrett's esophagus without dysplasia: Secondary | ICD-10-CM | POA: Diagnosis not present

## 2019-01-02 MED ORDER — SODIUM CHLORIDE 0.9 % IV SOLN: 500.0000 mL | Freq: Once | INTRAVENOUS | Status: DC

## 2019-01-02 NOTE — Op Note (Signed)
[place] Patient Name: Anna Mckinney Procedure Date: 01/02/2019 2:35 PM MRN: [id number] Endoscopist: Thornton Park MD, MD Age: 68 Referring MD:  Date of Birth: Mar 25, 1950 Gender: Female Account #: [id number] Procedure:                Upper GI endoscopy Indications:              Esophageal reflux symptoms that persist despite                            appropriate therapy                           GERD with persistent symptoms despite Nexium                           History of esophageal ulcer                           ? Barrett's esophagus suggested on prior endoscopy Medicines:                See the Anesthesia note for documentation of the                            administered medications Procedure:                Pre-Anesthesia Assessment:                           - Prior to the procedure, a History and Physical                            was performed, and patient medications and                            allergies were reviewed. The patient's tolerance of                            previous anesthesia was also reviewed. The risks                            and benefits of the procedure and the sedation                            options and risks were discussed with the patient.                            All questions were answered, and informed consent                            was obtained. Prior Anticoagulants: The patient has                            taken no previous anticoagulant or antiplatelet  agents. ASA Grade Assessment: II - A patient with                            mild systemic disease. After reviewing the risks                            and benefits, the patient was deemed in                            satisfactory condition to undergo the procedure.                           - Prior to the procedure, a History and Physical                            was performed, and patient medications and         allergies were reviewed. The patient's tolerance of                            previous anesthesia was also reviewed. The risks                            and benefits of the procedure and the sedation                            options and risks were discussed with the patient.                            All questions were answered, and informed consent                            was obtained. Prior Anticoagulants: The patient has                            taken no previous anticoagulant or antiplatelet                            agents. ASA Grade Assessment: II - A patient with                            mild systemic disease. After reviewing the risks                            and benefits, the patient was deemed in                            satisfactory condition to undergo the procedure.                           After obtaining informed consent, the endoscope was                            passed under direct vision. Throughout  the                            procedure, the patient's blood pressure, pulse, and                            oxygen saturations were monitored continuously. The                            Endoscope was introduced through the mouth, and                            advanced to the third part of duodenum. The upper                            GI endoscopy was accomplished without difficulty.                            The patient tolerated the procedure well. Scope In: Scope Out: Findings:                 One cratered esophageal ulcer with no bleeding and                            no stigmata of recent bleeding was found. The                            lesion was 3 mm in largest dimension. Biopsies were                            taken with a cold forceps for histology. Estimated                            blood loss was minimal.                           The examined esophagus was normal except for an                            irregular z-line.  Biopsies were obtained from the                            proximal and distal esophagus with cold forceps for                            histology. Estimated blood loss was minimal.                           A large hiatal hernia was present. No Cameron's                            ulcers present.                           The entire examined stomach  was normal. Biopsies                            were taken with a cold forceps for histology.                            Estimated blood loss was minimal.                           The examined duodenum was normal.                           The exam was otherwise without abnormality. Complications:            No immediate complications. Estimated blood loss:                            Minimal. Estimated Blood Loss:     Estimated blood loss was minimal. Impression:               - Non-bleeding esophageal ulcer. Biopsied.                           - Irregular z-line. Biopsied.                           - Large hiatal hernia.                           - Normal stomach. Biopsied.                           - Normal examined duodenum.                           - The examination was otherwise normal. Recommendation:           - Patient has a contact number available for                            emergencies. The signs and symptoms of potential                            delayed complications were discussed with the                            patient. Return to normal activities tomorrow.                            Written discharge instructions were provided to the                            patient.                           - Resume previous diet today.                           -  Continue present medications including Nexium 40                            mg daily.                           - Await pathology results.                           - Follow-up to review these results. Thornton Park MD, MD 01/02/2019 3:23:11 PM This report has  been signed electronically.

## 2019-01-02 NOTE — Patient Instructions (Signed)
The doctor would like for you to use metamucil to help your bowels.  Also, be sure to take your stomach medicine as ordered. Read all of the handouts given to you by your recovery room nurse.  YOU HAD AN ENDOSCOPIC PROCEDURE TODAY AT [facility]:   Refer to the procedure report that was given to you for any specific questions about what was found during the examination.  If the procedure report does not answer your questions, please call your gastroenterologist to clarify.  If you requested that your care partner not be given the details of your procedure findings, then the procedure report has been included in a sealed envelope for you to review at your convenience later.  YOU SHOULD EXPECT: Some feelings of bloating in the abdomen. Passage of more gas than usual.  Walking can help get rid of the air that was put into your GI tract during the procedure and reduce the bloating. If you had a lower endoscopy (such as a colonoscopy or flexible sigmoidoscopy) you may notice spotting of blood in your stool or on the toilet paper. If you underwent a bowel prep for your procedure, you may not have a normal bowel movement for a few days.  Please Note:  You might notice some irritation and congestion in your nose or some drainage.  This is from the oxygen used during your procedure.  There is no need for concern and it should clear up in a day or so.  SYMPTOMS TO REPORT IMMEDIATELY:   Following lower endoscopy (colonoscopy or flexible sigmoidoscopy):  Excessive amounts of blood in the stool  Significant tenderness or worsening of abdominal pains  Swelling of the abdomen that is new, acute  Fever of 100F or higher   Following upper endoscopy (EGD)  Vomiting of blood or coffee ground material  New chest pain or pain under the shoulder blades  Painful or persistently difficult swallowing  New shortness of breath  Fever of 100F or higher  Black, tarry-looking stools  For urgent or  emergent issues, a gastroenterologist can be reached at any hour by calling [phone].   DIET:  We do recommend a small meal at first, but then you may proceed to your regular diet.  Drink plenty of fluids but you should avoid alcoholic beverages for 24 hours. Try to increase the fiber in your diet, and drink at least 64 oz of water per day.  ACTIVITY:  You should plan to take it easy for the rest of today and you should NOT DRIVE or use heavy machinery until tomorrow (because of the sedation medicines used during the test).    FOLLOW UP: Our staff will call the number listed on your records 48-72 hours following your procedure to check on you and address any questions or concerns that you may have regarding the information given to you following your procedure. If we do not reach you, we will leave a message.  We will attempt to reach you two times.  During this call, we will ask if you have developed any symptoms of COVID 19. If you develop any symptoms (ie: fever, flu-like symptoms, shortness of breath, cough etc.) before then, please call [phone].  If you test positive for Covid 19 in the 2 weeks post procedure, please call and report this information to [place].    If any biopsies were taken you will be contacted by phone or by letter within the next 1-3 weeks.  Please call us at [phone] if you have not heard about the biopsies in 3 weeks.    SIGNATURES/CONFIDENTIALITY: You and/or your care partner have signed paperwork which will be entered into your electronic medical record.  These signatures attest to the fact that that the information above on your After Visit Summary has been reviewed and is understood.  Full responsibility of the confidentiality of this discharge information lies with you and/or your care-partner.

## 2019-01-02 NOTE — Progress Notes (Signed)
Vital signs-cw  Temp-jb

## 2019-01-02 NOTE — Op Note (Signed)
[place] Patient Name: Anna Mckinney Procedure Date: 01/02/2019 2:35 PM MRN: [id number] Endoscopist: Thornton Park MD, MD Age: 68 Referring MD:  Date of Birth: 03/16/1950 Gender: Female Account #: [id number] Procedure:                Colonoscopy Indications:              High risk colon cancer surveillance: Personal                            history of sessile serrated colon polyp (less than                            10 mm in size)                           History of colon polyps with Dr. Ferdinand Lango in 2013 Medicines:                See the Anesthesia note for documentation of the                            administered medications Procedure:                Pre-Anesthesia Assessment:                           - Prior to the procedure, a History and Physical                            was performed, and patient medications and                            allergies were reviewed. The patient's tolerance of                            previous anesthesia was also reviewed. The risks                            and benefits of the procedure and the sedation                            options and risks were discussed with the patient.                            All questions were answered, and informed consent                            was obtained. Prior Anticoagulants: The patient has                            taken no previous anticoagulant or antiplatelet                            agents. ASA Grade Assessment: II - A patient with  mild systemic disease. After reviewing the risks                            and benefits, the patient was deemed in                            satisfactory condition to undergo the procedure.                           After obtaining informed consent, the colonoscope                            was passed under direct vision. Throughout the                            procedure, the patient's blood pressure, pulse, and                             oxygen saturations were monitored continuously. The                            Colonoscope was introduced through the anus and                            advanced to the the terminal ileum, with                            identification of the appendiceal orifice and IC                            valve. A second forward view of the right colon was                            performed. The colonoscopy was performed without                            difficulty. The patient tolerated the procedure                            well. The quality of the bowel preparation was                            good. The terminal ileum, ileocecal valve,                            appendiceal orifice, and rectum were photographed. Scope In: 2:56:58 PM Scope Out: 3:08:29 PM Scope Withdrawal Time: 0 hours 6 minutes 11 seconds  Total Procedure Duration: 0 hours 11 minutes 31 seconds  Findings:                 The perianal and digital rectal examinations were                            normal.  A few small and large-mouthed diverticula were                            found in the sigmoid colon and descending colon.                           The exam was otherwise without abnormality on                            direct and retroflexion views. Complications:            No immediate complications. Estimated Blood Loss:     Estimated blood loss: none. Estimated blood loss:                            none. Impression:               - The entire examined colon is normal on direct and                            retroflexion views.                           - No specimens collected. Recommendation:           - Patient has a contact number available for                            emergencies. The signs and symptoms of potential                            delayed complications were discussed with the                            patient. Return to normal activities  tomorrow.                            Written discharge instructions were provided to the                            patient.                           - High fiber diet today. Drink at least 64 ounces                            of water daily. Consider daily Metamucil for stool                            bulking.                           - Patient has a contact number available for                            emergencies. The signs and symptoms of potential  delayed complications were discussed with the                            patient. Return to normal activities tomorrow.                            Written discharge instructions were provided to the                            patient.                           - Continue present medications.                           - Repeat colonoscopy in 7 years for surveillance. Thornton Park MD, MD 01/02/2019 3:18:46 PM This report has been signed electronically.

## 2019-01-02 NOTE — Progress Notes (Signed)
A/ox3, pleased with MAC, report to RN 

## 2019-01-03 ENCOUNTER — Encounter: Payer: Self-pay | Admitting: *Deleted

## 2019-01-04 ENCOUNTER — Telehealth: Payer: Self-pay

## 2019-01-04 NOTE — Telephone Encounter (Signed)
  Follow up Call-  Call back number 01/02/2019  Post procedure Call Back phone  # [phone]  Permission to leave phone message No  Some recent data might be hidden     Patient questions:  Do you have a fever, pain , or abdominal swelling? No. Pain Score  0 *  Have you tolerated food without any problems? Yes.    Have you been able to return to your normal activities? Yes.    Do you have any questions about your discharge instructions: Diet   No. Medications  No. Follow up visit  No.  Do you have questions or concerns about your Care? No.     Actions: * If pain score is 4 or above: No action needed, pain <4.   1. Have you developed a fever since your procedure? no  2.   Have you had an respiratory symptoms (SOB or cough) since your procedure? no  3.   Have you tested positive for COVID 19 since your procedure no  4.   Have you had any family members/close contacts diagnosed with the COVID 19 since your procedure?  no   If yes to any of these questions please route to Joylene John, RN and Alphonsa Gin, Therapist, sports.

## 2019-01-08 ENCOUNTER — Other Ambulatory Visit: Payer: Self-pay | Admitting: *Deleted

## 2019-01-08 ENCOUNTER — Encounter: Payer: Self-pay | Admitting: *Deleted

## 2019-01-08 DIAGNOSIS — Z1159 Encounter for screening for other viral diseases: Secondary | ICD-10-CM

## 2019-01-08 DIAGNOSIS — K219 Gastro-esophageal reflux disease without esophagitis: Secondary | ICD-10-CM

## 2019-01-08 DIAGNOSIS — K2210 Ulcer of esophagus without bleeding: Secondary | ICD-10-CM

## 2019-01-12 ENCOUNTER — Telehealth: Payer: Self-pay | Admitting: Gastroenterology

## 2019-01-12 NOTE — Telephone Encounter (Signed)
Spoke with patient who will come in for her appt on 12/8 and will sign paperwork then. Patient originally wanted to cancel this appt, but when asked several times, she would not say that she wanted the appointment cancelled. She ended the phone call reluctantly saying she'd come. When this RN told her she could cancel, she stated"Don't worry about it, I'll just come in."

## 2019-01-12 NOTE — Telephone Encounter (Signed)
Pt is confused why she is scheduled for an OV 02/13/19.  She stated that she does not need instructions for EGD and already knows the diagnosis from her previous EGD/col.

## 2019-01-31 ENCOUNTER — Other Ambulatory Visit: Payer: Self-pay

## 2019-02-13 ENCOUNTER — Ambulatory Visit: Payer: Medicare Other | Admitting: Gastroenterology

## 2019-02-14 ENCOUNTER — Encounter: Payer: Self-pay | Admitting: Gastroenterology

## 2019-02-14 ENCOUNTER — Ambulatory Visit (INDEPENDENT_AMBULATORY_CARE_PROVIDER_SITE_OTHER): Payer: Medicare Other | Admitting: Gastroenterology

## 2019-02-14 VITALS — BP 112/62 | HR 50 | Temp 97.5°F | Ht 64.0 in | Wt 150.2 lb

## 2019-02-14 DIAGNOSIS — K219 Gastro-esophageal reflux disease without esophagitis: Secondary | ICD-10-CM

## 2019-02-14 DIAGNOSIS — K2210 Ulcer of esophagus without bleeding: Secondary | ICD-10-CM | POA: Diagnosis not present

## 2019-02-14 MED ORDER — ESOMEPRAZOLE MAGNESIUM 40 MG PO CPDR: 40.0000 mg | DELAYED_RELEASE_CAPSULE | ORAL | 3 refills | Status: DC | PRN

## 2019-02-14 NOTE — Progress Notes (Signed)
Referring Provider: Denita Lung, MD Primary Care Physician:  Denita Lung, MD  Chief complaint: GERD   IMPRESSION:  GERD with persistent symptoms despite Nexium  History of colon polyps on prior endoscopy with Dr. Ferdinand Lango    - transverse colon hyperplastic polyps 2013 - ? sessile serrated polyps Distal esophageal ulcer in the setting of reflux esophagitis 01/02/2019     - noted on EGD 2010 with Dr. Ferdinand Lango. No intestinal metaplasia.  Very short segment Barrett's esophagus without dysplasia on EGD 01/02/2019    - EGD 2010: reflux and a distal esophageal ulcer.  There was no intestinal metaplasia.     - EGD in 2011 showed intestinal metaplasia.     - EGD in 2013 with Dr. Ferdinand Lango showed no intestinal metaplasia.    - irregular z-line on EGD 01/02/19 Family history of stomach cancer (maternal grandmother died at age 27) No known family history of colon cancer or polyps  Recent reflux esophagitis +/- LPR. New diagnosis of esophageal ulcer near Barrett's esophagus. Will plan repeat EGD to document clearing and obtain additional Barrett's biopsies.   PLAN: Continue Nexium BID (3 months with 3 refills provided today) Reflux lifestyle modifications reviewed  EGD in early January to follow-up on the esophageal ulcer and obtain additional biopsies Surveillance colonoscopy in 7 to 10 years given the history of polyps   Please see the "Patient Instructions" section for addition details about the plan.  HPI: Anna Mckinney is a 68 y.o. female who returns in follow-up for evaluation of reflux. She was last seen for endoscopic evaluation 01/02/19.  The interval history is obtained from the patient and review of her electronic health record. She had a cholecystectomy for chronic cholecystitis and cholelithiasis 05/09/2008. She also has a history of duodenal ulcer.  Previously followed by Dr. Carlean Purl.   She complains of intermittent difficulty with a tight feeling in her throat as well as  burning sensation that also causes some spasm. Associated cough and dysphonia. Temporally associated to the use of prednisone. No dysphonia, odynophagia, or dysphagia.  She also complains of some postnasal drainage.  She says that this pattern is relieved when she drinks water.  She does have underlying GERD and uses Nexium but states that that symptom is slightly different than the one she is now having. Is not using the Nexium every day, she only uses it when she is feeling reflux.   Returns in follow-up after her recent endoscopic evaluation.  A colonoscopy and EGD were performed 01/02/2019.  Colonoscopy showed left-sided diverticulosis.  The EGD showed a cratered 3 mm esophageal ulcer in the setting of reflux, very short segment Barrett's esophagus without dysplasia or malignancy, large hiatal hernia, and H. pylori negative chronic gastritis.  Biopsies were negative for eosinophilic esophagitis.  Taking Nexium 40 mg BID.  No reflux, epigastric pain. Continues to have the cough, but feels this is better.  Symptoms continue to be triggered by lying supine.  Using the Metamucil daily.  No cigarettes in 12 years. No alcohol.  No new complaints or concerns at this time.   Prior endoscopic evaluation: EGD 2010 showed reflux and a distal esophageal ulcer.  There was no intestinal metaplasia.  Colonoscopy 11/22/2008 with Ferdinand Lango: 4 polyps were removed, pathology results unclear, inadequate prep EGD in 2011 showed intestinal metaplasia.  EGD in 2013 with Dr. Ferdinand Lango showed no intestinal metaplasia.  Colonoscopy 2013 with Dr. Ferdinand Lango showed "inadequate prep" with multiple hyperplastic polyps removed including some from the transverse colon.  EGD 01/02/19: cratered 3 mm esophageal ulcer in the setting of reflux, Barrett's esophagus without dysplasia or malignancy, irregular Z-line, large hiatal hernia, chronic gastritis.  Esophageal biopsies were negative for eosinophilic esophagitis. Colonoscopy 01/02/19:  Left-sided diverticulosis   Past Medical History:  Diagnosis Date  . Barrett's esophagus     [place] (Dr. Ferdinand Lango)  . Blood transfusion without reported diagnosis   . Cataract    bilateral-removed  . Gastritis   . GERD (gastroesophageal reflux disease)   . Hemorrhoid   . Hyperplastic colon polyp 11/23/2010   Dr Virgel Bouquet  . Insomnia   . Internal prolapsed hemorrhoids s/p THD hemorrhoidal ligation/pexy 10/16/2012  . Osteopenia   . PONV (postoperative nausea and vomiting)    PT'S HEART RATE DOWN TO 40 WITH HER LAST SURGERY - SHE REMEMBERS BEING ASKED TO WAKE UP SO HEART RATE WOULD GO UP - SCARED HER.  . Rectal prolapse   . Sleep apnea 2015   mild, pt denies 01/02/19  . Ulcer of esophagus 10/08/2009   Dr Virgel Bouquet    Past Surgical History:  Procedure Laterality Date  . ABDOMINAL HYSTERECTOMY  1984   1 ovary remains; removed for "cancer cells"  . CATARACT EXTRACTION Bilateral end of 2015 & early 2016   Dr. Lucita Ferrara  . CHOLECYSTECTOMY  2010  . COLONOSCOPY    . COLONOSCOPY W/ BIOPSIES    . ESOPHAGOGASTRODUODENOSCOPY    . EVALUATION UNDER ANESTHESIA WITH HEMORRHOIDECTOMY N/A 01/25/2013   Procedure: EXAM UNDER ANESTHESIA WITH HEMORRHOIDECTOMY;  Surgeon: Adin Hector, MD;  Location: [facility];  Service: General;  Laterality: N/A;  . posterolateral external hemorrhoidactomy  10/20/2012   left  . SKIN BIOPSY Left    see chart.  Skin biopsy rontal let scalp  . TRANSANAL HEMORRHOIDAL DEARTERIALIZATION  10/20/2012   hemorrhoidal ligation and pexy.    Current Outpatient Medications  Medication Sig Dispense Refill  . acetaminophen (TYLENOL) 500 MG tablet Take 1,000 mg by mouth every 6 (six) hours as needed.    . Ascorbic Acid (VITAMIN C ER PO) Take by mouth.    . Cholecalciferol (VITAMIN D3) 5000 UNITS TABS Take 1 tablet by mouth daily.    . cyclobenzaprine (FLEXERIL) 10 MG tablet Take 0.5-1 tablets (5-10 mg total) by mouth 3 (three) times daily as needed for muscle  spasms. (Patient taking differently: Take 5-10 mg by mouth as needed for muscle spasms. ) 10 tablet 0  . esomeprazole (NEXIUM) 40 MG capsule Take 40 mg by mouth as needed.     . Probiotic Product (PRO-BIOTIC BLEND PO) Take by mouth.    . zolpidem (AMBIEN) 5 MG tablet TAKE 1 TABLET BY MOUTH AT BEDTIME IF NEEDED FOR SLEEP 30 tablet 1   No current facility-administered medications for this visit.     Allergies as of 02/14/2019 - Review Complete 01/02/2019  Allergen Reaction Noted  . Hydrocodone Itching and Nausea And Vomiting 01/25/2013  . Codeine    . Doxycycline Other (See Comments) 01/03/2014    Family History  Problem Relation Age of Onset  . Breast cancer Mother 76  . Cancer Mother        breast cancer  . COPD Father   . Heart disease Father        CHF  . Atrial fibrillation Father   . Angelman syndrome Daughter   . Seizures Daughter   . Thyroid disease Sister   . Diabetes Sister        diet controlled  . Arthritis  Sister        rheumatoid  . Hyperlipidemia Brother   . Colon cancer Sister 39  . Cancer Paternal Grandmother        stomach  . Stomach cancer Paternal Grandmother   . Esophageal cancer Neg Hx     Social History   Socioeconomic History  . Marital status: Married    Spouse name: Not on file  . Number of children: 2  . Years of education: Not on file  . Highest education level: Not on file  Occupational History  . 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 Financial resource strain: Not on file  . Food insecurity    Worry: Not on file    Inability: Not on file  . Transportation needs    Medical: Not on file    Non-medical: Not on file  Tobacco Use  . Smoking status: Former Smoker    Packs/day: 0.30    Years: 20.00    Pack years: 6.00    Types: Cigarettes    Quit date: 01/24/2009    Years since quitting: 10.0  . Smokeless tobacco: Never Used  Substance and Sexual Activity  . Alcohol use: No    Alcohol/week: 0.0 standard drinks   . Drug use: No  . Sexual activity: Not Currently  Lifestyle  . Physical activity    Days per week: Not on file    Minutes per session: Not on file  . Stress: Not on file  Relationships  . Social Herbalist on phone: Not on file    Gets together: Not on file    Attends religious service: Not on file    Active member of club or organization: Not on file    Attends meetings of clubs or organizations: Not on file    Relationship status: Not on file  . Intimate partner violence    Fear of current or ex partner: Not on file    Emotionally abused: Not on file    Physically abused: Not on file    Forced sexual activity: Not on file  Other Topics Concern  . Not on file  Social History Narrative   Lives with husband, daughter (with Angelman [place] is her caregiver) and 1 dog (boxer)    Physical Exam: General:   Alert,  well-nourished, pleasant and cooperative in NAD Abdomen:  Soft, nontender, nondistended, normal bowel sounds, no rebound or guarding. No hepatosplenomegaly.  . Neurologic:  Alert and  oriented x4;  grossly nonfocal Skin: No obvious rash or bruise   Psych:  Alert and cooperative. Normal mood and affect.    Adib Wahba L. Tarri Glenn, MD, MPH 02/14/2019, 1:02 PM

## 2019-02-14 NOTE — Patient Instructions (Addendum)
You have been scheduled for an endoscopy. Please follow written instructions given to you at your visit today. If you use inhalers (even only as needed), please bring them with you on the day of your procedure.  Continue Nexium twice a day   Patient advised to avoid spicy, acidic, citrus, chocolate, mints, fruit and fruit juices.  Limit the intake of caffeine, alcohol and Soda.  Don't exercise too soon after eating.  Don't lie down within 3-4 hours of eating.  Elevate the head of your bed.  You will be due for a recall colonoscopy in 2027. We will send you a reminder in the mail when it gets closer to that time.

## 2019-02-15 ENCOUNTER — Ambulatory Visit
Admission: RE | Admit: 2019-02-15 | Discharge: 2019-02-15 | Disposition: A | Discharge status: Home / Self Care | Payer: Medicare Other | Source: Ambulatory Visit | Attending: Family Medicine | Admitting: Family Medicine

## 2019-02-15 ENCOUNTER — Other Ambulatory Visit: Payer: Self-pay

## 2019-02-15 DIAGNOSIS — Z1231 Encounter for screening mammogram for malignant neoplasm of breast: Secondary | ICD-10-CM | POA: Diagnosis not present

## 2019-02-16 ENCOUNTER — Encounter: Payer: Self-pay | Admitting: Family Medicine

## 2019-02-16 ENCOUNTER — Other Ambulatory Visit: Payer: Self-pay

## 2019-02-16 ENCOUNTER — Ambulatory Visit (INDEPENDENT_AMBULATORY_CARE_PROVIDER_SITE_OTHER): Payer: Medicare Other | Admitting: Family Medicine

## 2019-02-16 VITALS — Temp 98.6°F | Wt 150.0 lb

## 2019-02-16 DIAGNOSIS — G4700 Insomnia, unspecified: Secondary | ICD-10-CM

## 2019-02-16 DIAGNOSIS — K2270 Barrett's esophagus without dysplasia: Secondary | ICD-10-CM

## 2019-02-16 DIAGNOSIS — K2210 Ulcer of esophagus without bleeding: Secondary | ICD-10-CM

## 2019-02-16 MED ORDER — BELSOMRA 15 MG PO TABS: 1.0000 | ORAL_TABLET | Freq: Every day | ORAL | 5 refills | Status: DC

## 2019-02-16 NOTE — Progress Notes (Signed)
   Subjective:    Patient ID: Anna Mckinney, female    DOB: [birth year], 68 y.o.   MRN: [id number]  HPI Documentation for virtual telephone encounter.  Documentation for virtual audio and video telecommunications through [employer] encounter:  Interactive audio and video telecommunications were attempted between this provider and patient, however she did not have access to video capability.  We continued and completed visit with audio only.  The patient was located at home. The provider was located in the office. The patient did consent to this visit and is aware of possible charges through their insurance for this visit. The other persons participating in this telemedicine service were none. Time spent on call was 5 minutes and in review of previous records >12 minutes total. This virtual service is not related to other E/M service within previous 7 days. The encounter was to discuss recent endoscopy which did show esophageal ulcer as well as Barrett's esophagus.  She was to be using double dosing of Nexium and follow-up with another study.  She has concerns over while her to take an antibiotic for that. She would also like a higher dose of the sleep med.  Presently she is on 5 mg of Ambien.    Review of Systems     Objective:   Physical Exam Alert and in no distress otherwise not examined       Assessment & Plan:  Barrett's esophagus without dysplasia  Insomnia, unspecified type - Plan: Suvorexant (BELSOMRA) 15 MG TABS  Ulcer of esophagus without bleeding I explained that she has a condition does not necessarily amenable to antibiotics and definitely needs a PPI as well as follow-up.  She will keep that appointment. I also explained that I could not go to a higher dose of Ambien and will therefore stop that medication and switch her to Belsomra.

## 2019-02-22 ENCOUNTER — Encounter: Payer: Medicare Other | Admitting: Gastroenterology

## 2019-03-06 ENCOUNTER — Encounter: Payer: Medicare Other | Admitting: Gastroenterology

## 2019-03-08 ENCOUNTER — Encounter: Payer: Medicare Other | Admitting: Gastroenterology

## 2019-03-27 ENCOUNTER — Ambulatory Visit (INDEPENDENT_AMBULATORY_CARE_PROVIDER_SITE_OTHER): Payer: PPO

## 2019-03-27 ENCOUNTER — Other Ambulatory Visit: Payer: Self-pay | Admitting: Gastroenterology

## 2019-03-27 DIAGNOSIS — Z1159 Encounter for screening for other viral diseases: Secondary | ICD-10-CM | POA: Diagnosis not present

## 2019-03-27 LAB — SARS CORONAVIRUS 2 (TAT 6-24 HRS): SARS Coronavirus 2: NEGATIVE

## 2019-03-29 ENCOUNTER — Ambulatory Visit (AMBULATORY_SURGERY_CENTER): Payer: PPO | Admitting: Gastroenterology

## 2019-03-29 ENCOUNTER — Other Ambulatory Visit: Payer: Self-pay

## 2019-03-29 ENCOUNTER — Encounter: Payer: Self-pay | Admitting: Gastroenterology

## 2019-03-29 VITALS — BP 114/61 | HR 67 | Temp 98.4°F | Resp 14 | Ht 64.0 in | Wt 150.0 lb

## 2019-03-29 DIAGNOSIS — K449 Diaphragmatic hernia without obstruction or gangrene: Secondary | ICD-10-CM | POA: Diagnosis not present

## 2019-03-29 DIAGNOSIS — K219 Gastro-esophageal reflux disease without esophagitis: Secondary | ICD-10-CM

## 2019-03-29 DIAGNOSIS — Z1211 Encounter for screening for malignant neoplasm of colon: Secondary | ICD-10-CM | POA: Diagnosis not present

## 2019-03-29 DIAGNOSIS — K228 Other specified diseases of esophagus: Secondary | ICD-10-CM | POA: Diagnosis not present

## 2019-03-29 MED ORDER — SODIUM CHLORIDE 0.9 % IV SOLN: 500.0000 mL | Freq: Once | INTRAVENOUS | Status: DC

## 2019-03-29 NOTE — Progress Notes (Signed)
To PACU, VSS. Report to [email or web address] 

## 2019-03-29 NOTE — Progress Notes (Signed)
Vitals-DT Temp-JB  History reviewed. 

## 2019-03-29 NOTE — Progress Notes (Signed)
Called to room to assist during endoscopic procedure.  Patient ID and intended procedure confirmed with present staff. Received instructions for my participation in the procedure from the performing physician.  

## 2019-03-29 NOTE — Op Note (Signed)
[facility] Patient Name: Anna Mckinney Procedure Date: 03/29/2019 7:55 AM MRN: [id number] Endoscopist: Thornton Park MD, MD Age: 69 Referring MD:  Date of Birth: 04/17/1950 Gender: Female Account #: [id number] Procedure:                Upper GI endoscopy Indications:              Follow-up of esophageal ulcer                           EGD 01/02/19: 32mm esophageal ulcer in the setting                            of esophagitis, pathology suggested very short                            segment Barrett's Esophagus Medicines:                Monitored Anesthesia Care Procedure:                Pre-Anesthesia Assessment:                           - Prior to the procedure, a History and Physical                            was performed, and patient medications and                            allergies were reviewed. The patient's tolerance of                            previous anesthesia was also reviewed. The risks                            and benefits of the procedure and the sedation                            options and risks were discussed with the patient.                            All questions were answered, and informed consent                            was obtained. Prior Anticoagulants: The patient has                            taken no previous anticoagulant or antiplatelet                            agents. ASA Grade Assessment: II - A patient with                            mild systemic disease. After reviewing the risks  and benefits, the patient was deemed in                            satisfactory condition to undergo the procedure.                           After obtaining informed consent, the endoscope was                            passed under direct vision. Throughout the                            procedure, the patient's blood pressure, pulse, and                            oxygen saturations were monitored continuously.  The                            Endoscope was introduced through the mouth, and                            advanced to the third part of duodenum. The upper                            GI endoscopy was accomplished without difficulty.                            The patient tolerated the procedure well. Scope In: Scope Out: Findings:                 The Z-line was irregular. There was no endoscopic                            evidence for esophagitis. No esophageal ulcer seen.                            Biopsies were taken with a cold forceps for                            histology. Estimated blood loss was minimal.                           A large hiatal hernia was present.                           The stomach was normal.                           The examined duodenum was normal. Complications:            No immediate complications. Estimated blood loss:                            Minimal. Estimated Blood Loss:     Estimated blood loss was minimal. Impression:               -  Z-line irregular. Biopsied.                           - Resolution of esophageal ulcer seen on 01/02/19                            ulcer.                           - Large hiatal hernia.                           - Normal stomach.                           - Normal examined duodenum. Recommendation:           - Patient has a contact number available for                            emergencies. The signs and symptoms of potential                            delayed complications were discussed with the                            patient. Return to normal activities tomorrow.                            Written discharge instructions were provided to the                            patient.                           - Resume previous diet.                           - Continue present medications. Reduce Nexium to                            every other day for two weeks. Then, discontinue                            the  Nexium. Restart Nexium if symptoms recur.                           - Await pathology results. Thornton Park MD, MD 03/29/2019 8:23:25 AM This report has been signed electronically.

## 2019-03-29 NOTE — Patient Instructions (Addendum)
HANDOUTS PROVIDED ON: HIATAL HERNIA  The biopsies taken today have been sent for pathology.  The results can take 1-3 weeks to receive.      You may resume your previous diet.  Continue present medications but reduce Nexium to every other day for 2 weeks then discontinue use unless you have recurrent symptoms.  Thank you for allowing [place] to care for you today!!!  YOU HAD AN ENDOSCOPIC PROCEDURE TODAY AT [facility]:   Refer to the procedure report that was given to you for any specific questions about what was found during the examination.  If the procedure report does not answer your questions, please call your gastroenterologist to clarify.  If you requested that your care partner not be given the details of your procedure findings, then the procedure report has been included in a sealed envelope for you to review at your convenience later.  Please Note:  You might notice some irritation and congestion in your nose or some drainage.  This is from the oxygen used during your procedure.  There is no need for concern and it should clear up in a day or so.  SYMPTOMS TO REPORT IMMEDIATELY:   Following upper endoscopy (EGD)  Vomiting of blood or coffee ground material  New chest pain or pain under the shoulder blades  Painful or persistently difficult swallowing  New shortness of breath  Fever of 100F or higher  Black, tarry-looking stools  For urgent or emergent issues, a gastroenterologist can be reached at any hour by calling [phone].   DIET:  We do recommend a small meal at first, but then you may proceed to your regular diet.  Drink plenty of fluids but you should avoid alcoholic beverages for 24 hours.  ACTIVITY:  You should plan to take it easy for the rest of today and you should NOT DRIVE or use heavy machinery until tomorrow (because of the sedation medicines used during the test).    FOLLOW UP: Our staff will call the number listed on your records 48-72  hours following your procedure to check on you and address any questions or concerns that you may have regarding the information given to you following your procedure. If we do not reach you, we will leave a message.  We will attempt to reach you two times.  During this call, we will ask if you have developed any symptoms of COVID 19. If you develop any symptoms (ie: fever, flu-like symptoms, shortness of breath, cough etc.) before then, please call [phone].  If you test positive for Covid 19 in the 2 weeks post procedure, please call and report this information to [place].    If any biopsies were taken you will be contacted by phone or by letter within the next 1-3 weeks.  Please call us at [phone] if you have not heard about the biopsies in 3 weeks.    SIGNATURES/CONFIDENTIALITY: You and/or your care partner have signed paperwork which will be entered into your electronic medical record.  These signatures attest to the fact that that the information above on your After Visit Summary has been reviewed and is understood.  Full responsibility of the confidentiality of this discharge information lies with you and/or your care-partner.

## 2019-04-02 ENCOUNTER — Telehealth: Payer: Self-pay

## 2019-04-02 NOTE — Telephone Encounter (Signed)
  Follow up Call-  Call back number 03/29/2019 01/02/2019  Post procedure Call Back phone  # [phone]  Permission to leave phone message Yes No  Some recent data might be hidden     Patient questions:  Do you have a fever, pain , or abdominal swelling? No. Pain Score  0 *  Have you tolerated food without any problems? Yes.    Have you been able to return to your normal activities? Yes.    Do you have any questions about your discharge instructions: Diet   No. Medications  No. Follow up visit  No.  Do you have questions or concerns about your Care? No.  Actions: * If pain score is 4 or above: No action needed, pain <4.   1. Have you developed a fever since your procedure? No  2.   Have you had an respiratory symptoms (SOB or cough) since your procedure? No  3.   Have you tested positive for COVID 19 since your procedure No  4.   Have you had any family members/close contacts diagnosed with the COVID 19 since your procedure?  No   If yes to any of these questions please route to Joylene John, RN and Alphonsa Gin, RN.

## 2019-04-09 DIAGNOSIS — H179 Unspecified corneal scar and opacity: Secondary | ICD-10-CM | POA: Diagnosis not present

## 2019-04-09 DIAGNOSIS — H5201 Hypermetropia, right eye: Secondary | ICD-10-CM | POA: Diagnosis not present

## 2019-04-30 DIAGNOSIS — H179 Unspecified corneal scar and opacity: Secondary | ICD-10-CM | POA: Diagnosis not present

## 2019-04-30 DIAGNOSIS — H18453 Nodular corneal degeneration, bilateral: Secondary | ICD-10-CM | POA: Diagnosis not present

## 2019-04-30 DIAGNOSIS — H04123 Dry eye syndrome of bilateral lacrimal glands: Secondary | ICD-10-CM | POA: Diagnosis not present

## 2019-04-30 DIAGNOSIS — H5201 Hypermetropia, right eye: Secondary | ICD-10-CM | POA: Diagnosis not present

## 2019-05-01 ENCOUNTER — Encounter: Payer: Self-pay | Admitting: *Deleted

## 2019-06-04 ENCOUNTER — Telehealth: Payer: Self-pay | Admitting: Family Medicine

## 2019-06-04 MED ORDER — ZOLPIDEM TARTRATE 5 MG PO TABS: 5.0000 mg | ORAL_TABLET | Freq: Every evening | ORAL | 1 refills | Status: DC | PRN

## 2019-06-04 NOTE — Telephone Encounter (Signed)
Pt needs Zolpidem 5 mg to [facility] on [address].  She cannot take Belsomra.

## 2019-06-04 NOTE — Telephone Encounter (Signed)
Belsomra made her feel weird and nausea.  When she woke up did not feel good, not right and she does not want ot take it.  The Zolpidem is in her history and she states that worked well.

## 2019-06-04 NOTE — Telephone Encounter (Signed)
I need more information for documentation as to why she cannot take it

## 2019-06-26 ENCOUNTER — Ambulatory Visit: Payer: PPO | Admitting: Gastroenterology

## 2019-07-05 DIAGNOSIS — H10023 Other mucopurulent conjunctivitis, bilateral: Secondary | ICD-10-CM | POA: Diagnosis not present

## 2019-07-05 DIAGNOSIS — S0501XA Injury of conjunctiva and corneal abrasion without foreign body, right eye, initial encounter: Secondary | ICD-10-CM | POA: Diagnosis not present

## 2019-07-06 DIAGNOSIS — H10023 Other mucopurulent conjunctivitis, bilateral: Secondary | ICD-10-CM | POA: Diagnosis not present

## 2019-07-06 DIAGNOSIS — S0501XA Injury of conjunctiva and corneal abrasion without foreign body, right eye, initial encounter: Secondary | ICD-10-CM | POA: Diagnosis not present

## 2019-07-11 ENCOUNTER — Other Ambulatory Visit: Payer: Self-pay

## 2019-07-11 ENCOUNTER — Telehealth (INDEPENDENT_AMBULATORY_CARE_PROVIDER_SITE_OTHER): Payer: PPO | Admitting: Family Medicine

## 2019-07-11 ENCOUNTER — Encounter: Payer: Self-pay | Admitting: Family Medicine

## 2019-07-11 VITALS — BP 103/78 | HR 68 | Wt 144.0 lb

## 2019-07-11 DIAGNOSIS — H8110 Benign paroxysmal vertigo, unspecified ear: Secondary | ICD-10-CM

## 2019-07-11 MED ORDER — MECLIZINE HCL 12.5 MG PO TABS: 12.5000 mg | ORAL_TABLET | Freq: Three times a day (TID) | ORAL | 0 refills | Status: DC | PRN

## 2019-07-11 NOTE — Progress Notes (Signed)
   Subjective:    Patient ID: Anna Mckinney, female    DOB: [birth year], 69 y.o.   MRN: [id number]  HPI  Documentation for virtual audio and video telecommunications through [place] encounter: The patient was located at home. The provider was located in the office. The patient did consent to this visit and is aware of possible charges through their insurance for this visit.  The other persons participating in this telemedicine service were none. Time spent on call was 8 minutesThis virtual service is not related to other E/M service within previous 7 days.  She complains of a 3-day history of difficulty with dizziness especially when she looks down.  When she looks in any other direction, she does not have dizziness.  No headache, blurred vision, sore throat, cough or congestion.   Review of Systems     Objective:   Physical Exam Alert and in no distress otherwise not examined      Assessment & Plan:  Benign paroxysmal positional vertigo, unspecified laterality I explained that I thought this was positional related vertigo.  Instructed her to check global for Epley maneuvers and see if she can do them on her own.  I will also call in meclizine for her.  If she has difficulty, I will refer to physical therapy.  She was comfortable with that.

## 2019-08-01 DIAGNOSIS — H179 Unspecified corneal scar and opacity: Secondary | ICD-10-CM | POA: Diagnosis not present

## 2019-08-01 DIAGNOSIS — H5201 Hypermetropia, right eye: Secondary | ICD-10-CM | POA: Diagnosis not present

## 2019-09-24 DIAGNOSIS — H179 Unspecified corneal scar and opacity: Secondary | ICD-10-CM | POA: Diagnosis not present

## 2019-09-24 DIAGNOSIS — H5201 Hypermetropia, right eye: Secondary | ICD-10-CM | POA: Diagnosis not present

## 2019-09-24 DIAGNOSIS — Z9889 Other specified postprocedural states: Secondary | ICD-10-CM | POA: Diagnosis not present

## 2019-10-14 ENCOUNTER — Other Ambulatory Visit: Payer: Self-pay | Admitting: Family Medicine

## 2019-10-14 MED ORDER — SULFAMETHOXAZOLE-TRIMETHOPRIM 800-160 MG PO TABS: 1.0000 | ORAL_TABLET | Freq: Two times a day (BID) | ORAL | 0 refills | Status: DC

## 2019-10-14 NOTE — Progress Notes (Signed)
She is having uti symptoms of dysuria. I will call in Septra

## 2019-10-23 ENCOUNTER — Other Ambulatory Visit: Payer: Self-pay | Admitting: Family Medicine

## 2019-10-24 DIAGNOSIS — H179 Unspecified corneal scar and opacity: Secondary | ICD-10-CM | POA: Diagnosis not present

## 2019-10-24 DIAGNOSIS — H5201 Hypermetropia, right eye: Secondary | ICD-10-CM | POA: Diagnosis not present

## 2019-11-19 ENCOUNTER — Telehealth: Payer: Self-pay

## 2019-11-19 ENCOUNTER — Encounter: Payer: Self-pay | Admitting: Family Medicine

## 2019-11-19 ENCOUNTER — Telehealth (INDEPENDENT_AMBULATORY_CARE_PROVIDER_SITE_OTHER): Payer: PPO | Admitting: Family Medicine

## 2019-11-19 ENCOUNTER — Other Ambulatory Visit: Payer: Self-pay

## 2019-11-19 VITALS — BP 130/76 | HR 51 | Wt 148.0 lb

## 2019-11-19 DIAGNOSIS — J029 Acute pharyngitis, unspecified: Secondary | ICD-10-CM

## 2019-11-19 DIAGNOSIS — R05 Cough: Secondary | ICD-10-CM

## 2019-11-19 DIAGNOSIS — R059 Cough, unspecified: Secondary | ICD-10-CM

## 2019-11-19 NOTE — Progress Notes (Signed)
° °  Subjective:  Documentation for virtual audio and video telecommunications through [place] encounter:  The patient was located at home. 2 patient identifiers used.  The provider was located in the office. The patient did consent to this visit and is aware of possible charges through their insurance for this visit.  The other persons participating in this telemedicine service were none. Time spent on call was 15 minutes and in review of previous records 20 minutes total.  This virtual service is not related to other E/M service within previous 7 days.   Patient ID: Anna Mckinney, female    DOB: Apr 28, 1950, 69 y.o.   MRN: [id number]  HPI Chief Complaint  Patient presents with   cough    cough, throat spasms- no fever, sratching throat. been going on a couple day. refuses covid and doesn't feel she has covid.    Complains of a 2 day history of post nasal drainage, sore throat and cough. Coughing up clear mucous.   States she knows she does not have Covid. She refused to come in earlier for Covid testing.   States she thinks she had Covid last August but never got tested.   States she has been doing salt water gargles and using chloraseptic. Takes Tylenol. Has Robitussin at home.   No fever, chills, headache, dizziness, chest pain, palpitations, shortness of breath, N/V/D.     Review of Systems Pertinent positives and negatives in the history of present illness.     Objective:   Physical Exam BP 130/76    Pulse (!) 51    Wt 148 lb (67.1 kg)    BMI 25.40 kg/m   Alert and oriented and in no acute distress.  Respirations unlabored.  Congested cough throughout the visit.  Speaking in complete sentences without difficulty.      Assessment & Plan:  Acute pharyngitis, unspecified etiology  Cough  No acute distress noted. She refused to come in for Covid test and earlier today but she is agreeable to come in in the morning.  She has not been vaccinated. No known Covid  infection in the past. Discussed symptomatic treatment including Tylenol, hydration, Robitussin or Mucinex, salt water gargles and she may continue using Chloraseptic spray she has at home. She will come in at 10 AM tomorrow for testing which will include Covid, flu and strep

## 2019-11-19 NOTE — Telephone Encounter (Signed)
PT CALLED REQUESTING TO HAVE HER AMBEIN FILL. pLEASE ADVISE. kH

## 2019-11-20 ENCOUNTER — Other Ambulatory Visit: Payer: Self-pay | Admitting: Family Medicine

## 2019-11-20 ENCOUNTER — Other Ambulatory Visit (INDEPENDENT_AMBULATORY_CARE_PROVIDER_SITE_OTHER): Payer: PPO

## 2019-11-20 DIAGNOSIS — R05 Cough: Secondary | ICD-10-CM

## 2019-11-20 DIAGNOSIS — R059 Cough, unspecified: Secondary | ICD-10-CM

## 2019-11-20 DIAGNOSIS — J029 Acute pharyngitis, unspecified: Secondary | ICD-10-CM | POA: Diagnosis not present

## 2019-11-20 LAB — POCT RAPID STREP A (OFFICE): Rapid Strep A Screen: NEGATIVE

## 2019-11-20 LAB — POCT INFLUENZA A/B
Influenza A, POC: NEGATIVE
Influenza B, POC: NEGATIVE

## 2019-11-20 LAB — POC COVID19 BINAXNOW: SARS Coronavirus 2 Ag: NEGATIVE

## 2019-11-20 MED ORDER — PROMETHAZINE-DM 6.25-15 MG/5ML PO SYRP: 5.0000 mL | ORAL_SOLUTION | Freq: Every evening | ORAL | 0 refills | Status: DC | PRN

## 2019-11-20 NOTE — Progress Notes (Signed)
I sent this to her pharmacy  

## 2019-11-22 LAB — SARS-COV-2, NAA 2 DAY TAT

## 2019-11-22 LAB — NOVEL CORONAVIRUS, NAA: SARS-CoV-2, NAA: NOT DETECTED

## 2019-11-25 NOTE — Progress Notes (Signed)
Her PCR Covid test was negative as well.

## 2019-11-28 ENCOUNTER — Other Ambulatory Visit: Payer: Self-pay | Admitting: Family Medicine

## 2019-11-28 NOTE — Telephone Encounter (Signed)
[facility] is requesting to fill pt ambien. Please advise KH 

## 2019-12-13 ENCOUNTER — Other Ambulatory Visit: Payer: Self-pay

## 2019-12-13 ENCOUNTER — Other Ambulatory Visit (INDEPENDENT_AMBULATORY_CARE_PROVIDER_SITE_OTHER): Payer: PPO

## 2019-12-13 DIAGNOSIS — Z23 Encounter for immunization: Secondary | ICD-10-CM

## 2020-01-02 ENCOUNTER — Telehealth (INDEPENDENT_AMBULATORY_CARE_PROVIDER_SITE_OTHER): Payer: PPO | Admitting: Family Medicine

## 2020-01-02 ENCOUNTER — Other Ambulatory Visit: Payer: Self-pay

## 2020-01-02 ENCOUNTER — Encounter: Payer: Self-pay | Admitting: Family Medicine

## 2020-01-02 VITALS — HR 70 | Wt 146.0 lb

## 2020-01-02 DIAGNOSIS — R059 Cough, unspecified: Secondary | ICD-10-CM

## 2020-01-02 DIAGNOSIS — R0981 Nasal congestion: Secondary | ICD-10-CM

## 2020-01-02 DIAGNOSIS — J029 Acute pharyngitis, unspecified: Secondary | ICD-10-CM

## 2020-01-02 DIAGNOSIS — H9201 Otalgia, right ear: Secondary | ICD-10-CM | POA: Diagnosis not present

## 2020-01-02 DIAGNOSIS — R067 Sneezing: Secondary | ICD-10-CM

## 2020-01-02 DIAGNOSIS — J3489 Other specified disorders of nose and nasal sinuses: Secondary | ICD-10-CM | POA: Diagnosis not present

## 2020-01-02 NOTE — Progress Notes (Signed)
   Subjective:  Documentation for virtual audio only-telephone call preferred by patient.   The patient was located at home. 2 patient identifiers used.  The provider was located in the office. The patient did consent to this visit and is aware of possible charges through their insurance for this visit.  The other persons participating in this telemedicine service were none. Time spent on call was 15 minutes and in review of previous records 20 minutes total.  This virtual service is not related to other E/M service within previous 7 days.   Patient ID: Anna Mckinney, female    DOB: 1950/09/15, 69 y.o.   MRN: [id number]  HPI Chief Complaint  Patient presents with  . cough    cough, dry throat- sore throat, ear pain, congestion, started yesterday afternoon. no fever right now,    Complains of URI symptoms since yesterday. Complains of sneezing, rhinorrhea (clear), nasal congestion, right ear pain, sore throat that is worse with coughing.  States she thinks she may have had a fever last night. No fever currently.  Denies chest pain, palpitations, shortness of breath, abdominal pain, vomiting or diarrhea.   She checked her vitals at home and states pulse ox 97-98% and HR 70  States she had Covid over a year ago. Is not willing to come in for a test due to she knows her body and does not have Covid.   No loss of taste or smell.   She has taken Tylenol and used chloraseptic.  Taking multivitamins.   She has Promethazine DM but has not taken it.   Requests something to help her body get over this.   Reviewed allergies, medications, past medical, surgical, family, and social history.   Review of Systems Pertinent positives and negatives in the history of present illness.     Objective:   Physical Exam Pulse 70   Wt 146 lb (66.2 kg)   SpO2 98%   BMI 25.06 kg/m   Alert and oriented and in no acute distress.  Speaking in complete sentences without difficulty.  She does  cough and sound nasally congested      Assessment & Plan:  Nasal congestion with rhinorrhea  Sneezing  Cough  Acute pharyngitis, unspecified etiology  Right ear pain  Symptom onset yesterday.  In-depth counseling on viral versus bacterial illnesses.  Discussed that I suspect she currently has a viral infection.  She declines Covid testing. She repeatedly requests an antibiotic. Discussed that the appropriate treatment at this time is maximizing symptomatic treatment including good hydration, salt water gargles for her throat as well as Chloraseptic which she has at home.  We also discussed Tylenol for ear pain and sore throat.  She prefers this to an anti-inflammatory.  I also recommend Mucinex.  She has Promethazine DM at home to take if needed for coughing. Encouraged her to let us know if she is getting significantly worse or if she is not improving over the next few days.  Discussed that viral illnesses generally take 7 to 10 days to run their course.  She states that she does not understand why I am not giving her an antibiotic.  Discussed that if her symptoms worsen significantly over the next few days or if she is not improving that she may need an antibiotic at that point but I believe she has a viral illness at present.

## 2020-01-03 ENCOUNTER — Other Ambulatory Visit: Payer: Self-pay | Admitting: Gastroenterology

## 2020-01-03 DIAGNOSIS — Z1159 Encounter for screening for other viral diseases: Secondary | ICD-10-CM

## 2020-02-04 ENCOUNTER — Other Ambulatory Visit: Payer: Self-pay

## 2020-02-04 ENCOUNTER — Encounter: Payer: Self-pay | Admitting: Family Medicine

## 2020-02-04 ENCOUNTER — Ambulatory Visit (INDEPENDENT_AMBULATORY_CARE_PROVIDER_SITE_OTHER): Payer: PPO | Admitting: Family Medicine

## 2020-02-04 VITALS — BP 126/80 | HR 50 | Temp 97.8°F | Ht 63.0 in | Wt 150.8 lb

## 2020-02-04 DIAGNOSIS — M549 Dorsalgia, unspecified: Secondary | ICD-10-CM | POA: Diagnosis not present

## 2020-02-04 DIAGNOSIS — Z0000 Encounter for general adult medical examination without abnormal findings: Secondary | ICD-10-CM | POA: Diagnosis not present

## 2020-02-04 DIAGNOSIS — Z86010 Personal history of colonic polyps: Secondary | ICD-10-CM | POA: Diagnosis not present

## 2020-02-04 DIAGNOSIS — K2270 Barrett's esophagus without dysplasia: Secondary | ICD-10-CM | POA: Diagnosis not present

## 2020-02-04 DIAGNOSIS — R059 Cough, unspecified: Secondary | ICD-10-CM

## 2020-02-04 DIAGNOSIS — E785 Hyperlipidemia, unspecified: Secondary | ICD-10-CM | POA: Diagnosis not present

## 2020-02-04 DIAGNOSIS — Z87442 Personal history of urinary calculi: Secondary | ICD-10-CM

## 2020-02-04 DIAGNOSIS — K219 Gastro-esophageal reflux disease without esophagitis: Secondary | ICD-10-CM | POA: Diagnosis not present

## 2020-02-04 DIAGNOSIS — M8580 Other specified disorders of bone density and structure, unspecified site: Secondary | ICD-10-CM | POA: Diagnosis not present

## 2020-02-04 DIAGNOSIS — Z803 Family history of malignant neoplasm of breast: Secondary | ICD-10-CM | POA: Diagnosis not present

## 2020-02-04 DIAGNOSIS — G4700 Insomnia, unspecified: Secondary | ICD-10-CM

## 2020-02-04 MED ORDER — ESOMEPRAZOLE MAGNESIUM 40 MG PO CPDR: 40.0000 mg | DELAYED_RELEASE_CAPSULE | ORAL | 3 refills | Status: AC | PRN

## 2020-02-04 MED ORDER — ZOLPIDEM TARTRATE 5 MG PO TABS: ORAL_TABLET | ORAL | 5 refills | Status: DC

## 2020-02-04 NOTE — Progress Notes (Signed)
Anna Mckinney is a 69 y.o. female who presents for annual wellness visit,CPE and follow-up on chronic medical conditions.  She has no particular concerns or complaints.  She does have a history of osteopenia and has been taking multivitamin as well as extra calcium and vitamin D.  Her last scan was 2 years ago.  She does complain of a slight cough that she has had for a long period of time.  She does have reflux disease as well as Barrett's esophagus and continues on Nexium for that.  She also continues on her sleep med.  She takes care of her daughter which interferes with her sleeping pattern.  She does have a history of colonic polyps and has had a recent colonoscopy.  She gets regular mammograms and does have a family history of breast cancer.  She has not had any kidney stones recently.  She does have a history of hyperlipidemia.  She also has concerns over vaginal systems that apparently a topical medicine works.  She did not bring it with me for me to renew it.  Immunizations and Health Maintenance Immunization History  Administered Date(s) Administered  . Fluad Quad(high Dose 65+) 12/25/2018, 12/13/2019  . Influenza Split 01/12/2011, 12/17/2011  . Influenza Whole 01/06/2007, 11/27/2007, 11/27/2009  . Influenza, High Dose Seasonal PF 12/02/2016, 12/22/2017  . Influenza,inj,Quad PF,6+ Mos 12/14/2012, 01/03/2014, 11/18/2014, 12/02/2015  . Pneumococcal Conjugate-13 12/02/2016  . Pneumococcal Polysaccharide-23 07/09/2005  . Td 07/09/2005  . Tdap 07/29/2011  . Zoster 10/01/2013   Health Maintenance Due  Topic Date Due  . COVID-19 Vaccine (1) Never done  . PNA vac Low Risk Adult (2 of 2 - PPSV23) 12/02/2017    Last Pap smear: aged out Last mammogram: 02/15/19 Last colonoscopy: 01/02/19 Last DEXA: 01/31/18 Dentist: Q six months  Ophtho: Q Three months Exercise: walking and weights  Other doctors caring for patient include: Dr. Lucita Ferrara eye, Dr. Tarri Glenn [facility]  Advanced  directives: Does Patient Have a Medical Advance Directive?: No Would patient like information on creating a medical advance directive?: Yes (ED - Information included in AVS)  Depression screen:  See questionnaire below.  Depression screen [facility] 2/9 02/04/2020 12/05/2017 12/02/2016 12/02/2015 11/18/2014  Decreased Interest 0 0 0 0 0  Down, Depressed, Hopeless 0 0 0 0 0  PHQ - 2 Score 0 0 0 0 0    Fall Risk Screen: see questionnaire below. Fall Risk  02/04/2020 01/31/2019 12/05/2017 12/02/2016 12/02/2015  Falls in the past year? 0 0 No No No  Comment - [facility] Telephone Survey: data to providers prior to load - - -    ADL screen:  See questionnaire below Functional Status Survey: Is the patient deaf or have difficulty hearing?: No Does the patient have difficulty seeing, even when wearing glasses/contacts?: Yes (right eye) Does the patient have difficulty concentrating, remembering, or making decisions?: No Does the patient have difficulty walking or climbing stairs?: No Does the patient have difficulty dressing or bathing?: No Does the patient have difficulty doing errands alone such as visiting a doctor's office or shopping?: No   Review of Systems Constitutional: -, -unexpected weight change, -anorexia, -fatigue Allergy: -sneezing, -itching, -congestion Dermatology: denies changing moles, rash, lumps ENT: -runny nose, -ear pain, -sore throat,  Cardiology:  -chest pain, -palpitations, -orthopnea, Respiratory: -cough, -shortness of breath, -dyspnea on exertion, -wheezing,  Gastroenterology: -abdominal pain, -nausea, -vomiting, -diarrhea, -constipation, -dysphagia Hematology: -bleeding or bruising problems Musculoskeletal: -arthralgias, -myalgias, -joint swelling, -back pain, - Ophthalmology: -vision changes,  Urology: -dysuria, -  difficulty urinating,  -urinary frequency, -urgency, incontinence Neurology: -, -numbness, , -memory loss, -falls, -dizziness    PHYSICAL EXAM:  General  Appearance: Alert, cooperative, no distress, appears stated age Head: Normocephalic, without obvious abnormality, atraumatic Eyes: PERRL, conjunctiva/corneas clear, EOM's intact, Ears: Normal TM's and external ear canals Nose: Nares normal, mucosa normal, no drainage or sinus tenderness Throat: Lips, mucosa, and tongue normal; teeth and gums normal Neck: Supple, no lymphadenopathy;  thyroid:  no enlargement/tenderness/nodules; no carotid bruit or JVD Lungs: Clear to auscultation bilaterally without wheezes, rales or ronchi; respirations unlabored Heart: Regular rate and rhythm, S1 and S2 normal, no murmur, rubor gallop Abdomen: Soft, non-tender, nondistended, normoactive bowel sounds,  no masses, no hepatosplenomegaly  Skin:  Skin color, texture, turgor normal, no rashes or lesions Lymph nodes: Cervical, supraclavicular, and axillary nodes normal Neurologic:  CNII-XII intact, normal strength, sensation and gait; reflexes 2+ and symmetric throughout Psych: Normal mood, affect, hygiene and grooming.  ASSESSMENT/PLAN: Routine general medical examination at a health care facility - Plan: CBC with Differential/Platelet, Comprehensive metabolic panel, Lipid panel  Osteopenia, unspecified location - Plan: DG Bone Density  Hx of colonic polyps  History of renal stone - Plan: CBC with Differential/Platelet, Comprehensive metabolic panel  Family history of breast cancer in mother  Barrett's esophagus without dysplasia - Plan: esomeprazole (NEXIUM) 40 MG capsule  Gastroesophageal reflux disease, unspecified whether esophagitis present  Cough  Dyslipidemia  Insomnia, unspecified type - Plan: zolpidem (AMBIEN) 5 MG tablet  Mid back pain Continue on her present medications.  Follow-up only history of colonic polyps as indicated.  No particular therapy for the cough. healthy diet, including goals of calcium and vitamin D intake and alcohol recommendations (less than or equal to 1 drink/day)  reviewed; she is to continue on her present medication immunization recommendations discussed.  Colonoscopy recommendations reviewed She will call back with the name of the medication to help with her vaginal issues; apparently this is interfering with her sex life.  Medicare Attestation I have personally reviewed: The patient's medical and social history Their use of alcohol, tobacco or illicit drugs Their current medications and supplements The patient's functional ability including ADLs,fall risks, home safety risks, cognitive, and hearing and visual impairment Diet and physical activities Evidence for depression or mood disorders  The patient's weight, height, and BMI have been recorded in the chart.  I have made referrals, counseling, and provided education to the patient based on review of the above and I have provided the patient with a written personalized care plan for preventive services.     Jill Alexanders, MD   02/04/2020

## 2020-02-04 NOTE — Patient Instructions (Signed)
  Anna Mckinney , Thank you for taking time to come for your Medicare Wellness Visit. I appreciate your ongoing commitment to your health goals. Please review the following plan we discussed and let me know if I can assist you in the future.   These are the goals we discussed: Call back with information concerning the vaginal cream. This is a list of the screening recommended for you and due dates:  Health Maintenance  Topic Date Due  . COVID-19 Vaccine (1) Never done  . Pneumonia vaccines (2 of 2 - PPSV23) 12/02/2017  . Mammogram  02/14/2021  . Tetanus Vaccine  07/28/2021  . Colon Cancer Screening  01/01/2026  . Flu Shot  Completed  . DEXA scan (bone density measurement)  Completed  .  Hepatitis C: One time screening is recommended by Center for Disease Control  (CDC) for  adults born from [birth year] through 1965.   Completed

## 2020-02-05 LAB — LIPID PANEL
Chol/HDL Ratio: 5.5 ratio — ABNORMAL HIGH (ref 0.0–4.4)
Cholesterol, Total: 221 mg/dL — ABNORMAL HIGH (ref 100–199)
HDL: 40 mg/dL (ref >39)
LDL Chol Calc (NIH): 160 mg/dL — ABNORMAL HIGH (ref 0–99)
Triglycerides: 116 mg/dL (ref 0–149)
VLDL Cholesterol Cal: 21 mg/dL (ref 5–40)

## 2020-02-05 LAB — COMPREHENSIVE METABOLIC PANEL
ALT: 17 IU/L (ref 0–32)
AST: 20 IU/L (ref 0–40)
Albumin/Globulin Ratio: 1.9 (ref 1.2–2.2)
Albumin: 4.3 g/dL (ref 3.8–4.8)
Alkaline Phosphatase: 85 IU/L (ref 44–121)
BUN/Creatinine Ratio: 17 (ref 12–28)
BUN: 11 mg/dL (ref 8–27)
Bilirubin Total: 0.6 mg/dL (ref 0.0–1.2)
CO2: 22 mmol/L (ref 20–29)
Calcium: 10.3 mg/dL (ref 8.7–10.3)
Chloride: 106 mmol/L (ref 96–106)
Creatinine, Ser: 0.66 mg/dL (ref 0.57–1.00)
GFR calc Af Amer: 105 mL/min/{1.73_m2} (ref >59)
GFR calc non Af Amer: 91 mL/min/{1.73_m2} (ref >59)
Globulin, Total: 2.3 g/dL (ref 1.5–4.5)
Glucose: 85 mg/dL (ref 65–99)
Potassium: 4.2 mmol/L (ref 3.5–5.2)
Sodium: 143 mmol/L (ref 134–144)
Total Protein: 6.6 g/dL (ref 6.0–8.5)

## 2020-02-05 LAB — CBC WITH DIFFERENTIAL/PLATELET
Basophils Absolute: 0 10*3/uL (ref 0.0–0.2)
Basos: 0 %
EOS (ABSOLUTE): 0 10*3/uL (ref 0.0–0.4)
Eos: 1 %
Hematocrit: 43.1 % (ref 34.0–46.6)
Hemoglobin: 15.1 g/dL (ref 11.1–15.9)
Immature Grans (Abs): 0 10*3/uL (ref 0.0–0.1)
Immature Granulocytes: 0 %
Lymphocytes Absolute: 2.5 10*3/uL (ref 0.7–3.1)
Lymphs: 43 %
MCH: 29.4 pg (ref 26.6–33.0)
MCHC: 35 g/dL (ref 31.5–35.7)
MCV: 84 fL (ref 79–97)
Monocytes Absolute: 0.5 10*3/uL (ref 0.1–0.9)
Monocytes: 9 %
Neutrophils Absolute: 2.7 10*3/uL (ref 1.4–7.0)
Neutrophils: 47 %
Platelets: 217 10*3/uL (ref 150–450)
RBC: 5.13 x10E6/uL (ref 3.77–5.28)
RDW: 13 % (ref 11.7–15.4)
WBC: 5.8 10*3/uL (ref 3.4–10.8)

## 2020-02-12 ENCOUNTER — Other Ambulatory Visit: Payer: Self-pay | Admitting: Family Medicine

## 2020-02-12 DIAGNOSIS — Z1231 Encounter for screening mammogram for malignant neoplasm of breast: Secondary | ICD-10-CM

## 2020-02-13 ENCOUNTER — Ambulatory Visit
Admission: RE | Admit: 2020-02-13 | Discharge: 2020-02-13 | Disposition: A | Discharge status: Home / Self Care | Payer: PPO | Source: Ambulatory Visit | Attending: Family Medicine | Admitting: Family Medicine

## 2020-02-13 ENCOUNTER — Other Ambulatory Visit: Payer: Self-pay

## 2020-02-13 DIAGNOSIS — M8588 Other specified disorders of bone density and structure, other site: Secondary | ICD-10-CM | POA: Diagnosis not present

## 2020-02-18 ENCOUNTER — Other Ambulatory Visit: Payer: Self-pay

## 2020-02-18 ENCOUNTER — Telehealth (INDEPENDENT_AMBULATORY_CARE_PROVIDER_SITE_OTHER): Payer: PPO | Admitting: Family Medicine

## 2020-02-18 ENCOUNTER — Encounter: Payer: Self-pay | Admitting: Family Medicine

## 2020-02-18 VITALS — BP 127/78 | Wt 147.0 lb

## 2020-02-18 DIAGNOSIS — M810 Age-related osteoporosis without current pathological fracture: Secondary | ICD-10-CM

## 2020-02-18 NOTE — Progress Notes (Addendum)
   Subjective:    Patient ID: Anna Mckinney, female    DOB: Nov 28, 1950, 69 y.o.   MRN: [id number]  HPI Documentation for virtual telephone encounter.  She was unable or unwilling to have a video conference call cell phone conference only was done.  She had a recent DEXA scan which did show osteoporosis. Patient: Home provider: Office  Review of Systems     Objective:   Physical Exam Alert and in no distress.  The DEXA scan was reviewed as well as her blood work.       Assessment & Plan:  Osteoporosis, unspecified osteoporosis type, unspecified pathological fracture presence I explained what the diagnosis of osteoporosis means in terms her risk of fracture.  Also discussed vitamin D supplementation as well as calcium.  Her blood work was reviewed with her and is normal.  I also discussed possible side effects of the shots versus pills.  Explained that I think Prolia would be a good choice for her since his 1 shot twice per year.  She will research this, call me if she has questions and then hopefully set up for the injection. 15 minutes spent discussing this issue with her.

## 2020-02-19 ENCOUNTER — Other Ambulatory Visit: Payer: Self-pay

## 2020-02-19 ENCOUNTER — Ambulatory Visit
Admission: RE | Admit: 2020-02-19 | Discharge: 2020-02-19 | Disposition: A | Discharge status: Home / Self Care | Payer: PPO | Source: Ambulatory Visit | Attending: Family Medicine | Admitting: Family Medicine

## 2020-02-19 DIAGNOSIS — Z1231 Encounter for screening mammogram for malignant neoplasm of breast: Secondary | ICD-10-CM | POA: Diagnosis not present

## 2020-03-12 DIAGNOSIS — H16233 Neurotrophic keratoconjunctivitis, bilateral: Secondary | ICD-10-CM | POA: Diagnosis not present

## 2020-03-12 DIAGNOSIS — H04123 Dry eye syndrome of bilateral lacrimal glands: Secondary | ICD-10-CM | POA: Diagnosis not present

## 2020-03-12 DIAGNOSIS — H179 Unspecified corneal scar and opacity: Secondary | ICD-10-CM | POA: Diagnosis not present

## 2020-03-12 DIAGNOSIS — H02403 Unspecified ptosis of bilateral eyelids: Secondary | ICD-10-CM | POA: Diagnosis not present

## 2020-03-14 ENCOUNTER — Telehealth: Payer: Self-pay

## 2020-03-14 NOTE — Telephone Encounter (Signed)
Pt. Called stating that she has some scar tissue on her rt. Eye which makes her vision blurry, they prescribed her an eye drop oxzenate but it is pricey wanted to know if you knew of any other eye drops that may be cheaper than these.

## 2020-03-14 NOTE — Telephone Encounter (Signed)
She needs to check with her othalmologist

## 2020-03-17 NOTE — Telephone Encounter (Signed)
Pt advised. KH 

## 2020-04-02 ENCOUNTER — Telehealth (INDEPENDENT_AMBULATORY_CARE_PROVIDER_SITE_OTHER): Payer: PPO | Admitting: Family Medicine

## 2020-04-02 ENCOUNTER — Other Ambulatory Visit: Payer: Self-pay

## 2020-04-02 ENCOUNTER — Encounter: Payer: Self-pay | Admitting: Family Medicine

## 2020-04-02 DIAGNOSIS — H8110 Benign paroxysmal vertigo, unspecified ear: Secondary | ICD-10-CM | POA: Diagnosis not present

## 2020-04-02 MED ORDER — MECLIZINE HCL 12.5 MG PO TABS: 12.5000 mg | ORAL_TABLET | Freq: Three times a day (TID) | ORAL | 0 refills | Status: DC | PRN

## 2020-04-02 NOTE — Progress Notes (Signed)
Start time: 4:50 End time: 5:09   Virtual Visit via Telephone Note  I connected with Anna Mckinney on 04/02/20 by telephone and verified that I am speaking with the correct person using two identifiers.  Location: Patient: home Provider: office   I discussed the limitations of evaluation and management by telemedicine and the availability of in person appointments. The patient expressed understanding and agreed to proceed.  History of Present Illness:  Chief Complaint  Patient presents with  . Dizziness    PHONE CALL vertigo "crickets in her ears" that started last night. Has had rx meclizine in the past that has helped.    She had a few dizzy feelings recently, brushed it off--sometimes gets that if she didn't sleep well.  Last night, while talking to her sister, she suddenly heard a loud cricket sound, she thinks in both ears.  Sounds like a high pitched buzzing.  It has been constant ever since, seems to be equal in both ears. She has also developed dizziness when she bends over (leaning forward)--feels like she will fall forward.  Denies feeling light-headed. If she squats (and keeps her head elevated), it prevents the dizziness. Hasn't yet developed any full vertigo/room-spinning, but feels like it will progress to that, like it has in the past.  Had a similar episode in the past (dizziness when bending forward, lost her balance, everything was spinning), and meclizine had helped. She is afraid if she lets it go, it will get worse, and get to that point.  Denies URI, ear pressure. Denies fever, chills, BP has been fine. She is trying to drink a lot of water, urine is clear, pale. Denies bleeding, weakness or fatigue.  Review of chart--meclizine 12.5mg  last rx'd 07/2019 #20  PMH, PSH, SH reviewed  Outpatient Encounter Medications as of 04/02/2020  Medication Sig  . Ascorbic Acid (VITAMIN C ER PO) Take by mouth.  . Cholecalciferol (VITAMIN D3) 5000 UNITS TABS Take 1  tablet by mouth daily.  [name] esomeprazole (NEXIUM) 40 MG capsule Take 1 capsule (40 mg total) by mouth as needed.  . Misc Natural Products (ELDERBERRY ZINC/VIT C/IMMUNE MT) Use as directed in the mouth or throat.  . Probiotic Product (PRO-BIOTIC BLEND PO) Take by mouth.  [name] Specialty Vitamins Products [facility]) CAPS Take by mouth.  . zolpidem (AMBIEN) 5 MG tablet TAKE 1 TABLET(5 MG) BY MOUTH AT BEDTIME AS NEEDED FOR SLEEP  . chlorhexidine (PERIDEX) 0.12 % solution 15 mLs 2 (two) times daily. (Patient not taking: Reported on 04/02/2020)  . meclizine (ANTIVERT) 12.5 MG tablet Take 1 tablet (12.5 mg total) by mouth 3 (three) times daily as needed for dizziness.  . [DISCONTINUED] meclizine (ANTIVERT) 12.5 MG tablet Take 1 tablet (12.5 mg total) by mouth 3 (three) times daily as needed for dizziness. (Patient not taking: Reported on 04/02/2020)  . [DISCONTINUED] promethazine-dextromethorphan (PROMETHAZINE-DM) 6.25-15 MG/5ML syrup Take 5 mLs by mouth at bedtime as needed for cough. (Patient not taking: Reported on 02/18/2020)   No facility-administered encounter medications on file as of 04/02/2020.   Allergies  Allergen Reactions  . Hydrocodone Itching and Nausea And Vomiting  . Codeine   . Doxycycline Other (See Comments)    Stomach pain    ROS: no fever, chills, URI symptoms.  Vertigo and buzzing/ringing in ear per HPI.  No syncope, numbness, tingling, weakness, or other neurologic symptoms. She has been dealing with decreased vision in R eye, seeing Dr. 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 She is alert, oriented, and in no distress Normal speech. Exam is limited due to virtual nature of the visit.  Assessment and Plan:  Benign paroxysmal positional vertigo,  unspecified laterality - reviewed risks/SE meclizine.  Reviewed red flags for further evaluation, other neuro symptoms. Ddx reviewed in detail - Plan: meclizine (ANTIVERT) 12.5 MG tablet   MAIL AVS TO HOME, not on Mychart.  Follow Up Instructions:    I discussed the assessment and treatment plan with the patient. The patient was provided an opportunity to ask questions and all were answered. The patient agreed with the plan and demonstrated an understanding of the instructions.   The patient was advised to call back or seek an in-person evaluation if the symptoms worsen or if the condition fails to improve as anticipated.  I spent 23 minutes dedicated to the care of this patient, including pre-visit review of records, face to face time, post-visit ordering of testing and documentation.   Vikki Ports, MD

## 2020-04-02 NOTE — Patient Instructions (Signed)
Take the meclizine as needed for dizziness. It sounds as though the dizziness is only occurring when you lean forward, and not with other positions.  This is likely a recurrence of the benign positional vertigo you had back in May when Dr. Redmond School last prescribed the meclizine.   You will need more urgent evaluation if you develop other neurologic symptoms--trouble thinking, speaking, numbness, weakness, etc.  If symptoms aren't clearing up over the next week, let us know.  Sometimes physical therapy can help fix this problems.   Benign Positional Vertigo Vertigo is the feeling that you or your surroundings are moving when they are not. Benign positional vertigo is the most common form of vertigo. This is usually a harmless condition (benign). This condition is positional. This means that symptoms are triggered by certain movements and positions. This condition can be dangerous if it occurs while you are doing something that could cause harm to you or others. This includes activities such as driving or operating machinery. What are the causes? The inner ear has fluid-filled canals that help your brain sense movement and balance. When the fluid moves, the brain receives messages about your body's position. With benign positional vertigo, crystals in the inner ear break free and disturb the inner ear area. This causes your brain to receive confusing messages about your body's position. What increases the risk? You are more likely to develop this condition if:  You are a woman.  You are 40 years of age or older.  You have recently had a head injury.  You have an inner ear disease. What are the signs or symptoms? Symptoms of this condition usually happen when you move your head or your eyes in different directions. Symptoms may start suddenly, and usually last for less than a minute. They include:  Loss of balance and falling.  Feeling like you are spinning or moving.  Feeling like your  surroundings are spinning or moving.  Nausea and vomiting.  Blurred vision.  Dizziness.  Involuntary eye movement (nystagmus). Symptoms can be mild and cause only minor problems, or they can be severe and interfere with daily life. Episodes of benign positional vertigo may return (recur) over time. Symptoms may improve over time. How is this diagnosed? This condition may be diagnosed based on:  Your medical history.  Physical exam of the head, neck, and ears.  Positional tests to check for or stimulate vertigo. You may be asked to turn your head and change positions, such as going from sitting to lying down. A health care provider will watch for symptoms of vertigo. You may be referred to a health care provider who specializes in ear, nose, and throat problems (ENT, or otolaryngologist) or a provider who specializes in disorders of the nervous system (neurologist). How is this treated? This condition may be treated in a session in which your health care provider moves your head in specific positions to help the displaced crystals in your inner ear move. Treatment for this condition may take several sessions. Surgery may be needed in severe cases, but this is rare. In some cases, benign positional vertigo may resolve on its own in 2-4 weeks.   Follow these instructions at home: Safety  Move slowly. Avoid sudden body or head movements or certain positions, as told by your health care provider.  Avoid driving until your health care provider says it is safe for you to do so.  Avoid operating heavy machinery until your health care provider says it is safe  for you to do so.  Avoid doing any tasks that would be dangerous to you or others if vertigo occurs.  If you have trouble walking or keeping your balance, try using a cane for stability. If you feel dizzy or unstable, sit down right away.  Return to your normal activities as told by your health care provider. Ask your health care  provider what activities are safe for you. General instructions  Take over-the-counter and prescription medicines only as told by your health care provider.  Drink enough fluid to keep your urine pale yellow.  Keep all follow-up visits as told by your health care provider. This is important. Contact a health care provider if:  You have a fever.  Your condition gets worse or you develop new symptoms.  Your family or friends notice any behavioral changes.  You have nausea or vomiting that gets worse.  You have numbness or a prickling and tingling sensation. Get help right away if you:  Have difficulty speaking or moving.  Are always dizzy.  Faint.  Develop severe headaches.  Have weakness in your legs or arms.  Have changes in your hearing or vision.  Develop a stiff neck.  Develop sensitivity to light. Summary  Vertigo is the feeling that you or your surroundings are moving when they are not. Benign positional vertigo is the most common form of vertigo.  This condition is caused by crystals in the inner ear that become displaced. This causes a disturbance in an area of the inner ear that helps your brain sense movement and balance.  Symptoms include loss of balance and falling, feeling that you or your surroundings are moving, nausea and vomiting, and blurred vision.  This condition can be diagnosed based on symptoms, a physical exam, and positional tests.  Follow safety instructions as told by your health care provider. You will also be told when to contact your health care provider in case of problems. This information is not intended to replace advice given to you by your health care provider. Make sure you discuss any questions you have with your health care provider. Document Revised: 01/16/2019 Document Reviewed: 08/03/2017 Elsevier Patient Education  2021 [place] [employer].   Vertigo Vertigo is the feeling that you or the things around you are moving when they  are not. This feeling can come and go at any time. Vertigo often goes away on its own. This condition can be dangerous if it happens when you are doing activities like driving or working with machines. Your doctor will do tests to find the cause of your vertigo. These tests will also help your doctor decide on the best treatment for you. Follow these instructions at home: Eating and drinking  Drink enough fluid to keep your pee (urine) pale yellow.  Do not drink alcohol.      Activity  Return to your normal activities as told by your doctor. Ask your doctor what activities are safe for you.  In the morning, first sit up on the side of the bed. When you feel okay, stand slowly while you hold onto something until you know that your balance is fine.  Move slowly. Avoid sudden body or head movements or certain positions, as told by your doctor.  Use a cane if you have trouble standing or walking.  Sit down right away if you feel dizzy.  Avoid doing any tasks or activities that can cause danger to you or others if you get dizzy.  Avoid bending down  if you feel dizzy. Place items in your home so that they are easy for you to reach without leaning over.  Do not drive or use heavy machinery if you feel dizzy. General instructions  Take over-the-counter and prescription medicines only as told by your doctor.  Keep all follow-up visits as told by your doctor. This is important. Contact a doctor if:  Your medicine does not help your vertigo.  You have a fever.  Your problems get worse or you have new symptoms.  Your family or friends see changes in your behavior.  The feeling of being sick to your stomach gets worse.  Your vomiting gets worse.  You lose feeling (have numbness) in part of your body.  You feel prickling and tingling in a part of your body. Get help right away if:  You have trouble moving or talking.  You are always dizzy.  You pass out (faint).  You get  very bad headaches.  You feel weak in your hands, arms, or legs.  You have changes in your hearing.  You have changes in how you see (vision).  You get a stiff neck.  Bright light starts to bother you. Summary  Vertigo is the feeling that you or the things around you are moving when they are not.  Your doctor will do tests to find the cause of your vertigo.  You may be told to avoid some tasks, positions, or movements.  Contact a doctor if your medicine is not helping, or if you have a fever, new symptoms, or a change in behavior.  Get help right away if you get very bad headaches, or if you have changes in how you speak, hear, or see. This information is not intended to replace advice given to you by your health care provider. Make sure you discuss any questions you have with your health care provider. Document Revised: 01/16/2018 Document Reviewed: 01/16/2018 Elsevier Patient Education  2021 [employer].

## 2020-04-03 NOTE — Progress Notes (Signed)
I sure will

## 2020-04-29 ENCOUNTER — Encounter: Payer: Self-pay | Admitting: Family Medicine

## 2020-04-29 ENCOUNTER — Telehealth: Payer: Self-pay | Admitting: Family Medicine

## 2020-04-29 NOTE — Telephone Encounter (Signed)
Anna Mckinney called, needing letter for [employer].  Letter on your desk.  Please sign and I will forward.

## 2020-05-06 DIAGNOSIS — N951 Menopausal and female climacteric states: Secondary | ICD-10-CM | POA: Diagnosis not present

## 2020-05-09 DIAGNOSIS — R232 Flushing: Secondary | ICD-10-CM | POA: Diagnosis not present

## 2020-05-09 DIAGNOSIS — Z6826 Body mass index (BMI) 26.0-26.9, adult: Secondary | ICD-10-CM | POA: Diagnosis not present

## 2020-05-09 DIAGNOSIS — N951 Menopausal and female climacteric states: Secondary | ICD-10-CM | POA: Diagnosis not present

## 2020-05-09 DIAGNOSIS — N898 Other specified noninflammatory disorders of vagina: Secondary | ICD-10-CM | POA: Diagnosis not present

## 2020-06-09 DIAGNOSIS — N951 Menopausal and female climacteric states: Secondary | ICD-10-CM | POA: Diagnosis not present

## 2020-06-11 DIAGNOSIS — N951 Menopausal and female climacteric states: Secondary | ICD-10-CM | POA: Diagnosis not present

## 2020-06-11 DIAGNOSIS — R6882 Decreased libido: Secondary | ICD-10-CM | POA: Diagnosis not present

## 2020-06-11 DIAGNOSIS — Z6825 Body mass index (BMI) 25.0-25.9, adult: Secondary | ICD-10-CM | POA: Diagnosis not present

## 2020-06-11 DIAGNOSIS — R232 Flushing: Secondary | ICD-10-CM | POA: Diagnosis not present

## 2020-07-15 ENCOUNTER — Encounter: Payer: Self-pay | Admitting: Family Medicine

## 2020-07-15 ENCOUNTER — Other Ambulatory Visit: Payer: Self-pay

## 2020-07-15 ENCOUNTER — Ambulatory Visit (INDEPENDENT_AMBULATORY_CARE_PROVIDER_SITE_OTHER): Payer: PPO | Admitting: Family Medicine

## 2020-07-15 VITALS — BP 148/88 | HR 67 | Temp 97.8°F | Ht 63.0 in | Wt 148.6 lb

## 2020-07-15 DIAGNOSIS — M62830 Muscle spasm of back: Secondary | ICD-10-CM | POA: Diagnosis not present

## 2020-07-15 LAB — POCT URINALYSIS DIP (PROADVANTAGE DEVICE)
Bilirubin, UA: NEGATIVE
Blood, UA: NEGATIVE
Glucose, UA: NEGATIVE mg/dL
Leukocytes, UA: NEGATIVE
Nitrite, UA: NEGATIVE
Specific Gravity, Urine: 1.03
Urobilinogen, Ur: 0.2
pH, UA: 5.5 (ref 5.0–8.0)

## 2020-07-15 MED ORDER — METHOCARBAMOL 500 MG PO TABS: 500.0000 mg | ORAL_TABLET | Freq: Three times a day (TID) | ORAL | 0 refills | Status: DC | PRN

## 2020-07-15 MED ORDER — CELECOXIB 200 MG PO CAPS: 200.0000 mg | ORAL_CAPSULE | Freq: Two times a day (BID) | ORAL | 0 refills | Status: DC

## 2020-07-15 NOTE — Patient Instructions (Signed)
Heat to your back for 20 minutes with gentle stretching after that.  2 Tylenol 4 times per day and you can also use the Celebrex twice per day.  Use the muscle relaxer as needed definitely at night and during the day if you need to contact.

## 2020-07-15 NOTE — Progress Notes (Signed)
   Subjective:    Patient ID: Anna Mckinney, female    DOB: 1950/12/25, 70 y.o.   MRN: [id number]  HPI She complains of a 3-day history of right-sided low back pain.  No history of overuse or injury to that area.  She does have pain with motion of her back but no radiation down her leg.  She did see her chiropractor and was told this could be a kidney stone.   Review of Systems     Objective:   Physical Exam Alert and complaining of right-sided low back pain.  She does have some palpable tenderness to the right paravertebral muscles.  Pain is made worse with any kind of motion.  No tenderness over SI joint.  Urine dipstick is negative.       Assessment & Plan:  Back muscle spasm - Plan: POCT Urinalysis DIP (Proadvantage Device), celecoxib (CELEBREX) 200 MG capsule, methocarbamol (ROBAXIN) 500 MG tablet I explained that this is musculoskeletal in nature but not a good reason as to why she is having it.  I will give her Celebrex as she has had difficulty with NSAIDs causing GI distress.  She is to use Tylenol regularly as well as heat and stretching exercises.  She will keep me informed.

## 2020-07-16 DIAGNOSIS — H04123 Dry eye syndrome of bilateral lacrimal glands: Secondary | ICD-10-CM | POA: Diagnosis not present

## 2020-07-16 DIAGNOSIS — H18453 Nodular corneal degeneration, bilateral: Secondary | ICD-10-CM | POA: Diagnosis not present

## 2020-07-16 DIAGNOSIS — H02403 Unspecified ptosis of bilateral eyelids: Secondary | ICD-10-CM | POA: Diagnosis not present

## 2020-07-16 DIAGNOSIS — H16233 Neurotrophic keratoconjunctivitis, bilateral: Secondary | ICD-10-CM | POA: Diagnosis not present

## 2020-08-07 ENCOUNTER — Ambulatory Visit (INDEPENDENT_AMBULATORY_CARE_PROVIDER_SITE_OTHER): Payer: PPO | Admitting: Family Medicine

## 2020-08-07 ENCOUNTER — Encounter: Payer: Self-pay | Admitting: Family Medicine

## 2020-08-07 ENCOUNTER — Other Ambulatory Visit: Payer: Self-pay

## 2020-08-07 VITALS — BP 150/90 | HR 54 | Temp 98.3°F | Wt 148.6 lb

## 2020-08-07 DIAGNOSIS — M461 Sacroiliitis, not elsewhere classified: Secondary | ICD-10-CM | POA: Diagnosis not present

## 2020-08-07 MED ORDER — KETOROLAC TROMETHAMINE 60 MG/2ML IM SOLN
60.0000 mg | Freq: Once | INTRAMUSCULAR | Status: AC
  Administered 2020-08-07: 60 mg via INTRAMUSCULAR

## 2020-08-07 MED ORDER — TRAMADOL HCL 50 MG PO TABS: 50.0000 mg | ORAL_TABLET | Freq: Three times a day (TID) | ORAL | 0 refills | Status: AC | PRN

## 2020-08-07 MED ORDER — DICLOFENAC SODIUM 75 MG PO TBEC
75.0000 mg | DELAYED_RELEASE_TABLET | Freq: Two times a day (BID) | ORAL | 0 refills | Status: DC
Start: 2020-08-07 — End: 2022-05-07

## 2020-08-07 NOTE — Progress Notes (Signed)
   Subjective:    Patient ID: Anna Mckinney, female    DOB: 1950/06/15, 70 y.o.   MRN: [id number]  HPI She continues to have difficulty with right-sided back pain.  She states that the Celebrex and muscle relaxer has had no benefit.  She now complains of localized pain in the right SI area.   Review of Systems     Objective:   Physical Exam Exam of the back shows palpable tenderness over the right SI joint.  FABER testing was positive.  Hip motion normal.       Assessment & Plan:  Sacroiliitis (HCC) - Plan: traMADol (ULTRAM) 50 MG tablet, diclofenac (VOLTAREN) 75 MG EC tablet  She is to use a Voltaren regularly and Ultram on an as-needed basis.  I will also give her a Toradol shot today because she states that she is very uncomfortable.  Discussed heat, stretching and also referring back to chiropractic for possible manipulation.  She was comfortable with

## 2020-08-07 NOTE — Patient Instructions (Signed)
I think you have sacroiliitis..  I will switch you to a different pain medication.  I want you to use heat on that area for 20 minutes 3 times per day and gentle stretching after that.  See your chiropractor

## 2020-08-07 NOTE — Addendum Note (Signed)
Addended by: Elyse Jarvis on: 08/07/2020 03:25 PM   Modules accepted: Orders

## 2020-08-15 ENCOUNTER — Telehealth: Payer: Self-pay | Admitting: Family Medicine

## 2020-08-15 DIAGNOSIS — G4700 Insomnia, unspecified: Secondary | ICD-10-CM

## 2020-08-15 DIAGNOSIS — N951 Menopausal and female climacteric states: Secondary | ICD-10-CM | POA: Diagnosis not present

## 2020-08-15 MED ORDER — ZOLPIDEM TARTRATE 5 MG PO TABS: ORAL_TABLET | ORAL | 5 refills | Status: DC

## 2020-08-15 NOTE — Telephone Encounter (Signed)
Needs refill on Ambien

## 2020-08-21 DIAGNOSIS — N951 Menopausal and female climacteric states: Secondary | ICD-10-CM | POA: Diagnosis not present

## 2020-08-21 DIAGNOSIS — R6882 Decreased libido: Secondary | ICD-10-CM | POA: Diagnosis not present

## 2020-08-21 DIAGNOSIS — Z6825 Body mass index (BMI) 25.0-25.9, adult: Secondary | ICD-10-CM | POA: Diagnosis not present

## 2020-10-29 DIAGNOSIS — H04123 Dry eye syndrome of bilateral lacrimal glands: Secondary | ICD-10-CM | POA: Diagnosis not present

## 2020-10-29 DIAGNOSIS — H02403 Unspecified ptosis of bilateral eyelids: Secondary | ICD-10-CM | POA: Diagnosis not present

## 2020-10-29 DIAGNOSIS — H16233 Neurotrophic keratoconjunctivitis, bilateral: Secondary | ICD-10-CM | POA: Diagnosis not present

## 2020-10-29 DIAGNOSIS — H179 Unspecified corneal scar and opacity: Secondary | ICD-10-CM | POA: Diagnosis not present

## 2020-12-08 DIAGNOSIS — R6882 Decreased libido: Secondary | ICD-10-CM | POA: Diagnosis not present

## 2020-12-08 DIAGNOSIS — N951 Menopausal and female climacteric states: Secondary | ICD-10-CM | POA: Diagnosis not present

## 2020-12-08 DIAGNOSIS — Z6824 Body mass index (BMI) 24.0-24.9, adult: Secondary | ICD-10-CM | POA: Diagnosis not present

## 2020-12-08 DIAGNOSIS — N898 Other specified noninflammatory disorders of vagina: Secondary | ICD-10-CM | POA: Diagnosis not present

## 2021-01-01 ENCOUNTER — Telehealth: Payer: Self-pay

## 2021-01-01 ENCOUNTER — Other Ambulatory Visit (INDEPENDENT_AMBULATORY_CARE_PROVIDER_SITE_OTHER): Payer: PPO

## 2021-01-01 ENCOUNTER — Other Ambulatory Visit: Payer: Self-pay

## 2021-01-01 DIAGNOSIS — Z0000 Encounter for general adult medical examination without abnormal findings: Secondary | ICD-10-CM | POA: Diagnosis not present

## 2021-01-01 DIAGNOSIS — Z23 Encounter for immunization: Secondary | ICD-10-CM | POA: Diagnosis not present

## 2021-01-01 DIAGNOSIS — E785 Hyperlipidemia, unspecified: Secondary | ICD-10-CM

## 2021-01-01 NOTE — Telephone Encounter (Signed)
Please advise if you only want basic labs for this pt . Her appointment is 01/13/21 Noberto Retort

## 2021-01-02 LAB — CBC WITH DIFFERENTIAL/PLATELET
Basophils Absolute: 0 10*3/uL (ref 0.0–0.2)
Basos: 1 %
EOS (ABSOLUTE): 0.1 10*3/uL (ref 0.0–0.4)
Eos: 2 %
Hematocrit: 44.6 % (ref 34.0–46.6)
Hemoglobin: 15.4 g/dL (ref 11.1–15.9)
Immature Grans (Abs): 0 10*3/uL (ref 0.0–0.1)
Immature Granulocytes: 0 %
Lymphocytes Absolute: 2.2 10*3/uL (ref 0.7–3.1)
Lymphs: 38 %
MCH: 29.8 pg (ref 26.6–33.0)
MCHC: 34.5 g/dL (ref 31.5–35.7)
MCV: 86 fL (ref 79–97)
Monocytes Absolute: 0.7 10*3/uL (ref 0.1–0.9)
Monocytes: 12 %
Neutrophils Absolute: 2.8 10*3/uL (ref 1.4–7.0)
Neutrophils: 47 %
Platelets: 226 10*3/uL (ref 150–450)
RBC: 5.17 x10E6/uL (ref 3.77–5.28)
RDW: 13.2 % (ref 11.7–15.4)
WBC: 5.9 10*3/uL (ref 3.4–10.8)

## 2021-01-02 LAB — COMPREHENSIVE METABOLIC PANEL
ALT: 14 IU/L (ref 0–32)
AST: 21 IU/L (ref 0–40)
Albumin/Globulin Ratio: 2 (ref 1.2–2.2)
Albumin: 4.5 g/dL (ref 3.8–4.8)
Alkaline Phosphatase: 78 IU/L (ref 44–121)
BUN/Creatinine Ratio: 24 (ref 12–28)
BUN: 17 mg/dL (ref 8–27)
Bilirubin Total: 0.6 mg/dL (ref 0.0–1.2)
CO2: 24 mmol/L (ref 20–29)
Calcium: 9.9 mg/dL (ref 8.7–10.3)
Chloride: 103 mmol/L (ref 96–106)
Creatinine, Ser: 0.71 mg/dL (ref 0.57–1.00)
Globulin, Total: 2.2 g/dL (ref 1.5–4.5)
Glucose: 102 mg/dL — ABNORMAL HIGH (ref 70–99)
Potassium: 4.3 mmol/L (ref 3.5–5.2)
Sodium: 140 mmol/L (ref 134–144)
Total Protein: 6.7 g/dL (ref 6.0–8.5)
eGFR: 92 mL/min/{1.73_m2} (ref >59)

## 2021-01-02 LAB — LIPID PANEL
Chol/HDL Ratio: 6.1 ratio — ABNORMAL HIGH (ref 0.0–4.4)
Cholesterol, Total: 189 mg/dL (ref 100–199)
HDL: 31 mg/dL — ABNORMAL LOW (ref >39)
LDL Chol Calc (NIH): 134 mg/dL — ABNORMAL HIGH (ref 0–99)
Triglycerides: 133 mg/dL (ref 0–149)
VLDL Cholesterol Cal: 24 mg/dL (ref 5–40)

## 2021-01-05 DIAGNOSIS — R232 Flushing: Secondary | ICD-10-CM | POA: Diagnosis not present

## 2021-01-05 DIAGNOSIS — Z6824 Body mass index (BMI) 24.0-24.9, adult: Secondary | ICD-10-CM | POA: Diagnosis not present

## 2021-01-05 DIAGNOSIS — N951 Menopausal and female climacteric states: Secondary | ICD-10-CM | POA: Diagnosis not present

## 2021-01-05 DIAGNOSIS — R6882 Decreased libido: Secondary | ICD-10-CM | POA: Diagnosis not present

## 2021-01-05 DIAGNOSIS — N898 Other specified noninflammatory disorders of vagina: Secondary | ICD-10-CM | POA: Diagnosis not present

## 2021-01-07 ENCOUNTER — Other Ambulatory Visit: Payer: Self-pay | Admitting: Family Medicine

## 2021-01-07 DIAGNOSIS — Z1231 Encounter for screening mammogram for malignant neoplasm of breast: Secondary | ICD-10-CM

## 2021-01-13 ENCOUNTER — Other Ambulatory Visit: Payer: Self-pay

## 2021-01-13 ENCOUNTER — Other Ambulatory Visit: Payer: PPO

## 2021-01-13 ENCOUNTER — Ambulatory Visit (INDEPENDENT_AMBULATORY_CARE_PROVIDER_SITE_OTHER): Payer: PPO | Admitting: Family Medicine

## 2021-01-13 VITALS — BP 110/68 | HR 93 | Temp 98.0°F | Ht 63.0 in | Wt 143.0 lb

## 2021-01-13 DIAGNOSIS — R030 Elevated blood-pressure reading, without diagnosis of hypertension: Secondary | ICD-10-CM | POA: Diagnosis not present

## 2021-01-13 DIAGNOSIS — M810 Age-related osteoporosis without current pathological fracture: Secondary | ICD-10-CM | POA: Diagnosis not present

## 2021-01-13 NOTE — Patient Instructions (Signed)
Check your blood pressure after you have rested for 5 minutes, in the sitting position in your arm at heart level.  Your pressure should be 130/80 or lower but we need to look at that on average

## 2021-01-13 NOTE — Progress Notes (Signed)
   Subjective:    Patient ID: Anna Mckinney, female    DOB: 1951-01-09, 70 y.o.   MRN: [id number]  HPI She is here for a recheck.  She has had some elevated blood pressure readings when she visited various offices.  She does have a blood pressure cuff at home and apparently has been measured to make sure it is accurate. She also has a history of osteoporosis however she is not interested in Prolia or an oral medications.  She is taking a multivitamin with extra calcium.   Review of Systems     Objective:   Physical Exam Alert and in no distress.  Blood pressure is recorded.       Assessment & Plan:  Osteoporosis, unspecified osteoporosis type, unspecified pathological fracture presence - Not interested in Prolia or in pills.  Elevated blood pressure reading I explained that as today she does not have hypertension.  Explained the correct way to check her blood pressure with the resting sitting position and arm at heart level.  Explained that it needs to be a sustained elevated blood pressure.  At this point we will continue to monitor it.  No medicines needed.  She was comfortable with that.

## 2021-01-16 ENCOUNTER — Other Ambulatory Visit (INDEPENDENT_AMBULATORY_CARE_PROVIDER_SITE_OTHER): Payer: PPO

## 2021-01-16 ENCOUNTER — Other Ambulatory Visit: Payer: Self-pay

## 2021-01-16 ENCOUNTER — Encounter: Payer: Self-pay | Admitting: Family Medicine

## 2021-01-16 ENCOUNTER — Telehealth (INDEPENDENT_AMBULATORY_CARE_PROVIDER_SITE_OTHER): Payer: PPO | Admitting: Family Medicine

## 2021-01-16 VITALS — BP 124/80 | Temp 98.8°F | Ht 63.0 in | Wt 140.0 lb

## 2021-01-16 DIAGNOSIS — R0981 Nasal congestion: Secondary | ICD-10-CM | POA: Diagnosis not present

## 2021-01-16 DIAGNOSIS — J029 Acute pharyngitis, unspecified: Secondary | ICD-10-CM | POA: Diagnosis not present

## 2021-01-16 DIAGNOSIS — J101 Influenza due to other identified influenza virus with other respiratory manifestations: Secondary | ICD-10-CM

## 2021-01-16 DIAGNOSIS — R059 Cough, unspecified: Secondary | ICD-10-CM | POA: Diagnosis not present

## 2021-01-16 LAB — POCT RAPID STREP A (OFFICE): Rapid Strep A Screen: NEGATIVE

## 2021-01-16 LAB — POCT INFLUENZA A/B
Influenza A, POC: POSITIVE — AB
Influenza B, POC: NEGATIVE

## 2021-01-16 LAB — POC COVID19 BINAXNOW: SARS Coronavirus 2 Ag: NEGATIVE

## 2021-01-16 MED ORDER — OSELTAMIVIR PHOSPHATE 75 MG PO CAPS: 75.0000 mg | ORAL_CAPSULE | Freq: Two times a day (BID) | ORAL | 0 refills | Status: DC

## 2021-01-16 NOTE — Progress Notes (Signed)
   Subjective:    Patient ID: Anna Mckinney, female    DOB: Dec 10, 1950, 70 y.o.   MRN: [id number]  HPI She complains of a 2-day history of sore throat, nasal congestion but no fever, chills, earache.  She has been using OTC medications.  She has been taking Tylenol 2 pills twice per day. Review of Systems     Objective:   Physical Exam Alert and in no distress.  Not toxic appearing she was seen in the back parking lot and a strep/influenza nasal swab was taken.  She was positive for influenza A.       Assessment & Plan:  Influenza A - Plan: oseltamivir (TAMIFLU) 75 MG capsule Recommend 2 Tylenol 4 times per day as well as gargling on an as-needed basis for the sore throat.

## 2021-02-03 DIAGNOSIS — H02831 Dermatochalasis of right upper eyelid: Secondary | ICD-10-CM | POA: Diagnosis not present

## 2021-02-03 DIAGNOSIS — H02834 Dermatochalasis of left upper eyelid: Secondary | ICD-10-CM | POA: Diagnosis not present

## 2021-02-13 ENCOUNTER — Telehealth (INDEPENDENT_AMBULATORY_CARE_PROVIDER_SITE_OTHER): Payer: PPO | Admitting: Medical

## 2021-02-13 ENCOUNTER — Other Ambulatory Visit: Payer: Self-pay | Admitting: Medical

## 2021-02-13 VITALS — BP 128/78 | HR 76 | Temp 98.0°F | Wt 140.0 lb

## 2021-02-13 DIAGNOSIS — Z87891 Personal history of nicotine dependence: Secondary | ICD-10-CM | POA: Diagnosis not present

## 2021-02-13 DIAGNOSIS — R051 Acute cough: Secondary | ICD-10-CM

## 2021-02-13 DIAGNOSIS — R058 Other specified cough: Secondary | ICD-10-CM

## 2021-02-13 MED ORDER — AZITHROMYCIN 250 MG PO TABS: ORAL_TABLET | ORAL | 0 refills | Status: DC

## 2021-02-13 MED ORDER — PROMETHAZINE-DM 6.25-15 MG/5ML PO SYRP: 5.0000 mL | ORAL_SOLUTION | Freq: Four times a day (QID) | ORAL | 0 refills | Status: DC | PRN

## 2021-02-13 MED ORDER — ALBUTEROL SULFATE HFA 108 (90 BASE) MCG/ACT IN AERS: 2.0000 | INHALATION_SPRAY | Freq: Four times a day (QID) | RESPIRATORY_TRACT | 0 refills | Status: DC | PRN

## 2021-02-13 NOTE — Progress Notes (Signed)
Subjective:     Patient ID: Anna Mckinney, female   DOB: [birth year], 70 y.o.   MRN: [id number]  This visit type was conducted due to national recommendations for restrictions regarding the COVID-19 Pandemic (e.g. social distancing) in an effort to limit this patient's exposure and mitigate transmission in our community.  Due to their co-morbid illnesses, this patient is at least at moderate risk for complications without adequate follow up.  This format is felt to be most appropriate for this patient at this time.    Documentation for virtual audio and video telecommunications through [place] encounter:  The patient was located at home. The provider was located in the office. The patient did consent to this visit and is aware of possible charges through their insurance for this visit.  The other persons participating in this telemedicine service were none. Time spent on call was 20 minutes and in review of previous records 20 minutes total.  This virtual service is not related to other E/M service within previous 7 days.   HPI Chief Complaint  Patient presents with   cough    Cough, congestion since Tuesday. Fever for a couple days- no fever today. Taking otc tylenol    Virtual consult for illness.   Started 3 days ago.  Had fever up to 101.2.  she notes couhg, congestion, using tylenol round the clock.  Nose will feel stopped up then will be watery alternating.  Fever has resolved at this point.  Had some body aches and chills.  No ear pain.  Throat feels scrathy.  Vomited once the first night of this.  Coughing a lot.  No sob or wheezin.   Using tylenol, vitamins, elderberry liquid. No sick contacts. Quit smoking 15 years ago.  No other aggravating or relieving factors. No other complaint.  Past Medical History:  Diagnosis Date   Barrett's esophagus     [place] (Dr. Ferdinand Lango)   Blood transfusion without reported diagnosis    Cataract    bilateral-removed   Gastritis     GERD (gastroesophageal reflux disease)    Hemorrhoid    Hyperplastic colon polyp 11/23/2010   Dr Virgel Bouquet   Insomnia    Internal prolapsed hemorrhoids s/p THD hemorrhoidal ligation/pexy 10/16/2012   Osteopenia    PONV (postoperative nausea and vomiting)    PT'S HEART RATE DOWN TO 40 WITH HER LAST SURGERY - SHE REMEMBERS BEING ASKED TO WAKE UP SO HEART RATE WOULD GO UP - SCARED HER.   Rectal prolapse    Sleep apnea 2015   mild, pt denies 01/02/19   Ulcer of esophagus 10/08/2009   Dr Virgel Bouquet   Current Outpatient Medications on File Prior to Visit  Medication Sig Dispense Refill   Ascorbic Acid (VITAMIN C ER PO) Take by mouth.     Cholecalciferol (VITAMIN D3) 5000 UNITS TABS Take 1 tablet by mouth daily.     esomeprazole (NEXIUM) 40 MG capsule Take 1 capsule (40 mg total) by mouth as needed. 90 capsule 3   Misc Natural Products (ELDERBERRY ZINC/VIT C/IMMUNE MT) Use as directed in the mouth or throat.     Probiotic Product (PRO-BIOTIC BLEND PO) Take by mouth.     Specialty Vitamins Products [facility]) CAPS Take by mouth.     UPNEEQ 0.1 % SOLN Instill 1 drop into both eyes once a day     zolpidem (AMBIEN) 5 MG tablet TAKE 1 TABLET(5 MG) BY MOUTH AT BEDTIME AS NEEDED FOR SLEEP 30 tablet 5  celecoxib (CELEBREX) 200 MG capsule Take 1 capsule (200 mg total) by mouth 2 (two) times daily. (Patient not taking: Reported on 01/13/2021) 20 capsule 0   diclofenac (VOLTAREN) 75 MG EC tablet Take 1 tablet (75 mg total) by mouth 2 (two) times daily. (Patient not taking: Reported on 01/13/2021) 30 tablet 0   meclizine (ANTIVERT) 12.5 MG tablet Take 1 tablet (12.5 mg total) by mouth 3 (three) times daily as needed for dizziness. (Patient not taking: Reported on 01/13/2021) 20 tablet 0   methocarbamol (ROBAXIN) 500 MG tablet Take 1 tablet (500 mg total) by mouth every 8 (eight) hours as needed for muscle spasms. (Patient not taking: Reported on 01/13/2021) 20 tablet 0   No current facility-administered  medications on file prior to visit.   Review of Systems As in subjective    Objective:   Physical Exam  Due to coronavirus pandemic stay at home measures, patient visit was virtual and they were not examined in person.   BP 128/78   Pulse 76   Temp 98 F (36.7 C)   Wt 140 lb (63.5 kg)   BMI 24.80 kg/m   General: No acute distress No labored breathing or wheezing Answers questions in complete sentences Coughing quite a bit    Assessment:     Encounter Diagnoses  Name Primary?   Acute cough Yes   Respiratory tract congestion with cough    Former smoker        Plan:      On the phone she has a deep rattly cough.  She is a former smoker and is prone to going to bronchitis.  Begin Promethazine DM to help with cough, rest, hydrate well, continue salt water gargles and nasal saline.  If more tight in the chest rattly cough wheezing or shortness of breath then begin inhaler.  Can begin Z-Pak given the productive mucus and heavy deep cough and symptoms overall.  If not much improved or worse by Monday in 3 days then call back or recheck.  Vidhi was seen today for cough.  Diagnoses and all orders for this visit:  Acute cough  Respiratory tract congestion with cough  Former smoker  Other orders -     promethazine-dextromethorphan (PROMETHAZINE-DM) 6.25-15 MG/5ML syrup; Take 5 mLs by mouth 4 (four) times daily as needed for cough. -     azithromycin (ZITHROMAX) 250 MG tablet; 2 tablets day 1, then 1 tablet days 2-4 -     albuterol (VENTOLIN HFA) 108 (90 Base) MCG/ACT inhaler; Inhale 2 puffs into the lungs every 6 (six) hours as needed for wheezing or shortness of breath.   F/u prn

## 2021-02-19 ENCOUNTER — Ambulatory Visit
Admission: RE | Admit: 2021-02-19 | Discharge: 2021-02-19 | Disposition: A | Discharge status: Home / Self Care | Payer: PPO | Source: Ambulatory Visit | Attending: Family Medicine | Admitting: Family Medicine

## 2021-02-19 ENCOUNTER — Telehealth: Payer: Self-pay | Admitting: Family Medicine

## 2021-02-19 DIAGNOSIS — Z1231 Encounter for screening mammogram for malignant neoplasm of breast: Secondary | ICD-10-CM

## 2021-02-19 NOTE — Telephone Encounter (Signed)
Spoke to patient to schedule Medicare Annual Wellness Visit (AWV) either virtually or in office.  Patient stated she was leaving didn't have time to schedule   Last AWV ;02/04/20  please schedule at anytime with health coach  This should be a 45 minute visit.

## 2021-02-20 ENCOUNTER — Other Ambulatory Visit: Payer: Self-pay

## 2021-02-20 DIAGNOSIS — G4700 Insomnia, unspecified: Secondary | ICD-10-CM

## 2021-02-20 MED ORDER — ZOLPIDEM TARTRATE 5 MG PO TABS: ORAL_TABLET | ORAL | 5 refills | Status: DC

## 2021-02-20 NOTE — Telephone Encounter (Signed)
Patient called for refill request of Ambien, last refill was June, last visit was 11/22

## 2021-03-17 ENCOUNTER — Other Ambulatory Visit: Payer: Self-pay

## 2021-03-17 ENCOUNTER — Encounter: Payer: Self-pay | Admitting: Family Medicine

## 2021-03-17 ENCOUNTER — Ambulatory Visit (INDEPENDENT_AMBULATORY_CARE_PROVIDER_SITE_OTHER): Payer: PPO | Admitting: Family Medicine

## 2021-03-17 VITALS — BP 132/80 | HR 71 | Temp 97.5°F | Wt 145.0 lb

## 2021-03-17 DIAGNOSIS — J029 Acute pharyngitis, unspecified: Secondary | ICD-10-CM

## 2021-03-17 LAB — POCT RAPID STREP A (OFFICE): Rapid Strep A Screen: NEGATIVE

## 2021-03-17 NOTE — Patient Instructions (Signed)
2 Tylenol extra strength 3 times per day and also 2 Aleve twice per day

## 2021-03-17 NOTE — Progress Notes (Signed)
° °  Subjective:    Patient ID: Anna Mckinney, female    DOB: 17-Feb-1951, 71 y.o.   MRN: [id number]  HPI She complains of a 2-day history of having left earache and painful swallowing on the left with some pain into the left scalp area with nasal congestion and some postnasal drainage.  No fever, chills, cough.   Review of Systems     Objective:   Physical Exam Alert and in no distress. Tympanic membranes and canals are normal.  The left ear appears normal with no rash.  No other lesions are noted in the scalp area.  Pharyngeal area is normal. Neck is supple without adenopathy or thyromegaly. Cardiac exam shows a regular sinus rhythm without murmurs or gallops. Lungs are clear to auscultation. Strep screen is negative.       Assessment & Plan:  Sore throat - Plan: POCT rapid strep A I am concerned about the symptoms that she is having  2 Tylenol extra strength 3 times per day and also 2 Aleve twice per day Return here in one week

## 2021-03-30 ENCOUNTER — Telehealth: Payer: Self-pay | Admitting: Family Medicine

## 2021-03-30 NOTE — Telephone Encounter (Signed)
Spoke with patient to schedule Medicare Annual Wellness Visit (AWV) either virtually or in office.  Patient declined stating she already had this done   Last AWV ;02/04/20 please schedule at anytime with health coach  This should be a 45 minute visit.

## 2021-04-20 DIAGNOSIS — H02834 Dermatochalasis of left upper eyelid: Secondary | ICD-10-CM | POA: Diagnosis not present

## 2021-04-20 DIAGNOSIS — H02831 Dermatochalasis of right upper eyelid: Secondary | ICD-10-CM | POA: Diagnosis not present

## 2021-05-22 DIAGNOSIS — R6882 Decreased libido: Secondary | ICD-10-CM | POA: Diagnosis not present

## 2021-05-22 DIAGNOSIS — Z6825 Body mass index (BMI) 25.0-25.9, adult: Secondary | ICD-10-CM | POA: Diagnosis not present

## 2021-05-22 DIAGNOSIS — N951 Menopausal and female climacteric states: Secondary | ICD-10-CM | POA: Diagnosis not present

## 2021-05-22 DIAGNOSIS — N898 Other specified noninflammatory disorders of vagina: Secondary | ICD-10-CM | POA: Diagnosis not present

## 2021-08-04 DIAGNOSIS — H10023 Other mucopurulent conjunctivitis, bilateral: Secondary | ICD-10-CM | POA: Diagnosis not present

## 2021-08-04 DIAGNOSIS — S0501XA Injury of conjunctiva and corneal abrasion without foreign body, right eye, initial encounter: Secondary | ICD-10-CM | POA: Diagnosis not present

## 2021-08-05 DIAGNOSIS — H10023 Other mucopurulent conjunctivitis, bilateral: Secondary | ICD-10-CM | POA: Diagnosis not present

## 2021-08-25 ENCOUNTER — Other Ambulatory Visit: Payer: Self-pay | Admitting: Family Medicine

## 2021-08-25 DIAGNOSIS — G4700 Insomnia, unspecified: Secondary | ICD-10-CM

## 2021-08-25 NOTE — Telephone Encounter (Signed)
Refill request for Ambien sent to [facility] on [address].

## 2021-11-26 ENCOUNTER — Encounter: Payer: Self-pay | Admitting: Family Medicine

## 2021-11-26 ENCOUNTER — Ambulatory Visit (INDEPENDENT_AMBULATORY_CARE_PROVIDER_SITE_OTHER): Payer: PPO | Admitting: Family Medicine

## 2021-11-26 VITALS — BP 122/80 | HR 55 | Temp 97.0°F | Ht 64.0 in | Wt 144.0 lb

## 2021-11-26 DIAGNOSIS — R030 Elevated blood-pressure reading, without diagnosis of hypertension: Secondary | ICD-10-CM

## 2021-11-26 DIAGNOSIS — M810 Age-related osteoporosis without current pathological fracture: Secondary | ICD-10-CM

## 2021-11-26 DIAGNOSIS — G4700 Insomnia, unspecified: Secondary | ICD-10-CM | POA: Diagnosis not present

## 2021-11-26 DIAGNOSIS — K219 Gastro-esophageal reflux disease without esophagitis: Secondary | ICD-10-CM

## 2021-11-26 DIAGNOSIS — E785 Hyperlipidemia, unspecified: Secondary | ICD-10-CM

## 2021-11-26 DIAGNOSIS — Z0000 Encounter for general adult medical examination without abnormal findings: Secondary | ICD-10-CM | POA: Diagnosis not present

## 2021-11-26 MED ORDER — ZOLPIDEM TARTRATE 5 MG PO TABS: 5.0000 mg | ORAL_TABLET | Freq: Every day | ORAL | 1 refills | Status: DC

## 2021-11-26 NOTE — Progress Notes (Signed)
Complete physical exam  Patient: Anna Mckinney   DOB: 08-19-1950   71 y.o. Female  MRN: [id number]  Subjective:    Chief Complaint  Patient presents with   Annual Exam    Fasting     Anna Mckinney is a 71 y.o. female who presents today for a complete physical exam. She reports consuming a general and low sodium diet.  Staying active more than 20 minutes day.  She generally feels well. She reports sleeping well with the use of Ambien. She does have additional problems to discuss today.  She does have a history of colonic polyp and is scheduled for routine follow-up on that.  She also has a history of osteoporosis but has chosen not to take any medications nor does she want to follow-up with her DEXA scan.  Her reflux seems to be under fairly good control.She does have a previous history of elevated blood pressure but today's number looks good.  Otherwise seems things are going quite nicely for her.  Family and social history was reviewed.   Most recent fall risk assessment:    02/13/2021    9:02 AM  Fall Risk   Falls in the past year? 0  Number falls in past yr: 0  Injury with Fall? 0  Risk for fall due to : No Fall Risks  Follow up Falls evaluation completed     Most recent depression screenings:    11/26/2021   10:59 AM 02/13/2021    9:02 AM  PHQ 2/9 Scores  PHQ - 2 Score 0 0      Patient Active Problem List   Diagnosis Date Noted   Osteoporosis 02/18/2020   Former smoker 10/21/2017   Family history of breast cancer in mother 12/02/2016   Mid back pain 03/04/2016   History of renal stone 12/02/2015   Hx of colonic polyps 01/28/2015   Insomnia 12/25/2012   External hemorrhoids s/p ext hemorrhoidectomy x2 01/25/2013 11/07/2012   Internal prolapsed hemorrhoids s/p THD hemorrhoidal ligation/pexy 10/20/2012 10/16/2012   Dyslipidemia 09/26/2012   GERD (gastroesophageal reflux disease) 07/24/2012   Barrett's esophagus 07/24/2012   Cough 02/24/2011   Osteopenia  07/27/2010   Past Medical History:  Diagnosis Date   Barrett's esophagus     [place] (Dr. Ferdinand Lango)   Blood transfusion without reported diagnosis    Cataract    bilateral-removed   Gastritis    GERD (gastroesophageal reflux disease)    Hemorrhoid    Hyperplastic colon polyp 11/23/2010   Dr Virgel Bouquet   Insomnia    Internal prolapsed hemorrhoids s/p THD hemorrhoidal ligation/pexy 10/16/2012   Osteopenia    PONV (postoperative nausea and vomiting)    PT'S HEART RATE DOWN TO 40 WITH HER LAST SURGERY - SHE REMEMBERS BEING ASKED TO WAKE UP SO HEART RATE WOULD GO UP - SCARED HER.   Rectal prolapse    Sleep apnea 2015   mild, pt denies 01/02/19   Ulcer of esophagus 10/08/2009   Dr Virgel Bouquet   Past Surgical History:  Procedure Laterality Date   ABDOMINAL HYSTERECTOMY  1984   1 ovary remains; removed for "cancer cells"   CATARACT EXTRACTION Bilateral end of 2015 & early 2016   Dr. Lucita Ferrara   CHOLECYSTECTOMY  2010   COLONOSCOPY     COLONOSCOPY W/ BIOPSIES     ESOPHAGOGASTRODUODENOSCOPY     EVALUATION UNDER ANESTHESIA WITH HEMORRHOIDECTOMY N/A 01/25/2013   Procedure: EXAM UNDER ANESTHESIA WITH HEMORRHOIDECTOMY;  Surgeon: Adin Hector,  MD;  Location: [facility];  Service: General;  Laterality: N/A;   posterolateral external hemorrhoidactomy  10/20/2012   left   SKIN BIOPSY Left    see chart.  Skin biopsy rontal let scalp   TRANSANAL HEMORRHOIDAL DEARTERIALIZATION  10/20/2012   hemorrhoidal ligation and pexy.   Social History   Tobacco Use   Smoking status: Former    Packs/day: 0.30    Years: 20.00    Total pack years: 6.00    Types: Cigarettes    Quit date: 01/24/2009    Years since quitting: 12.8   Smokeless tobacco: Never  Vaping Use   Vaping Use: Never used  Substance Use Topics   Alcohol use: No    Alcohol/week: 0.0 standard drinks of alcohol   Drug use: No   Family History  Problem Relation Age of Onset   Breast cancer Mother 52   Cancer Mother         breast cancer   COPD Father    Heart disease Father        CHF   Atrial fibrillation Father    Esophageal cancer Father    Angelman syndrome Daughter    Seizures Daughter    Thyroid disease Sister    Diabetes Sister        diet controlled   Arthritis Sister        rheumatoid   Hyperlipidemia Brother    Colon cancer Sister 60   Cancer Paternal Grandmother        stomach   Stomach cancer Paternal Grandmother    Allergies  Allergen Reactions   Hydrocodone Itching and Nausea And Vomiting   Codeine    Doxycycline Other (See Comments)    Stomach pain    Oxycodone Hcl Other (See Comments)      Patient Care Team: Denita Lung, MD as PCP - General (Family Medicine) Richmond Campbell, MD as Consulting Physician (Gastroenterology)   Outpatient Medications Prior to Visit  Medication Sig Note   Ascorbic Acid (VITAMIN C ER PO) Take by mouth.    Cholecalciferol (VITAMIN D3) 5000 UNITS TABS Take 1 tablet by mouth daily.    Misc Natural Products (ELDERBERRY ZINC/VIT C/IMMUNE MT) Use as directed in the mouth or throat.    Probiotic Product (PRO-BIOTIC BLEND PO) Take by mouth.    Specialty Vitamins Products [facility]) CAPS Take by mouth.    zolpidem (AMBIEN) 5 MG tablet TAKE 1 TABLET(5 MG) BY MOUTH AT BEDTIME AS NEEDED FOR SLEEP 11/26/2021: Prn last dose three days ago   albuterol (VENTOLIN HFA) 108 (90 Base) MCG/ACT inhaler Inhale 2 puffs into the lungs every 6 (six) hours as needed for wheezing or shortness of breath. (Patient not taking: Reported on 03/17/2021)    azithromycin (ZITHROMAX) 250 MG tablet 2 tablets day 1, then 1 tablet days 2-4 (Patient not taking: Reported on 03/17/2021)    celecoxib (CELEBREX) 200 MG capsule Take 1 capsule (200 mg total) by mouth 2 (two) times daily. (Patient not taking: Reported on 01/13/2021)    diclofenac (VOLTAREN) 75 MG EC tablet Take 1 tablet (75 mg total) by mouth 2 (two) times daily. (Patient not taking: Reported on 01/13/2021)    esomeprazole  (NEXIUM) 40 MG capsule Take 1 capsule (40 mg total) by mouth as needed. (Patient not taking: Reported on 11/26/2021) 11/26/2021: Prn last dose a month ago   meclizine (ANTIVERT) 12.5 MG tablet Take 1 tablet (12.5 mg total) by mouth 3 (three) times daily as needed for dizziness. (Patient  not taking: Reported on 01/13/2021)    methocarbamol (ROBAXIN) 500 MG tablet Take 1 tablet (500 mg total) by mouth every 8 (eight) hours as needed for muscle spasms. (Patient not taking: Reported on 01/13/2021)    promethazine-dextromethorphan (PROMETHAZINE-DM) 6.25-15 MG/5ML syrup Take 5 mLs by mouth 4 (four) times daily as needed for cough. (Patient not taking: Reported on 03/17/2021)    UPNEEQ 0.1 % SOLN Instill 1 drop into both eyes once a day (Patient not taking: Reported on 03/17/2021)    No facility-administered medications prior to visit.    Review of Systems  All other systems reviewed and are negative.         Objective:     BP 122/80   Pulse (!) 55   Temp (!) 97 F (36.1 C)   Ht _0  (1.626 m)   Wt 144 lb (65.3 kg)   SpO2 98%   BMI 24.72 kg/m  BP Readings from Last 3 Encounters:  11/26/21 122/80  03/17/21 132/80  02/13/21 128/78   Wt Readings from Last 3 Encounters:  11/26/21 144 lb (65.3 kg)  03/17/21 145 lb (65.8 kg)  02/13/21 140 lb (63.5 kg)      Physical Exam   Alert and in no distress. Tympanic membranes and canals are normal. Pharyngeal area is normal. Neck is supple without adenopathy or thyromegaly. Cardiac exam shows a regular sinus rhythm without murmurs or gallops. Lungs are clear to auscultation.  Last CBC Lab Results  Component Value Date   WBC 5.9 01/01/2021   HGB 15.4 01/01/2021   HCT 44.6 01/01/2021   MCV 86 01/01/2021   MCH 29.8 01/01/2021   RDW 13.2 01/01/2021   PLT 226 69/62/9528   Last metabolic panel Lab Results  Component Value Date   GLUCOSE 102 (H) 01/01/2021   NA 140 01/01/2021   K 4.3 01/01/2021   CL 103 01/01/2021   CO2 24 01/01/2021    BUN 17 01/01/2021   CREATININE 0.71 01/01/2021   EGFR 92 01/01/2021   CALCIUM 9.9 01/01/2021   PROT 6.7 01/01/2021   ALBUMIN 4.5 01/01/2021   LABGLOB 2.2 01/01/2021   AGRATIO 2.0 01/01/2021   BILITOT 0.6 01/01/2021   ALKPHOS 78 01/01/2021   AST 21 01/01/2021   ALT 14 01/01/2021   Last lipids Lab Results  Component Value Date   CHOL 189 01/01/2021   HDL 31 (L) 01/01/2021   LDLCALC 134 (H) 01/01/2021   TRIG 133 01/01/2021   CHOLHDL 6.1 (H) 01/01/2021        Assessment & Plan:    Routine general medical examination at a health care facility - Plan: CBC with Differential/Platelet, Comprehensive metabolic panel, Lipid panel  Elevated blood pressure reading - Plan: CBC with Differential/Platelet  Gastroesophageal reflux disease, unspecified whether esophagitis present  Dyslipidemia - Plan: Lipid panel  Insomnia, unspecified type - Plan: zolpidem (AMBIEN) 5 MG tablet  Osteoporosis, unspecified osteoporosis type, unspecified pathological fracture presence  Immunization History  Administered Date(s) Administered   Fluad Quad(high Dose 65+) 12/25/2018, 12/13/2019, 01/01/2021   Influenza Split 01/12/2011, 12/17/2011   Influenza Whole 01/06/2007, 11/27/2007, 11/27/2009   Influenza, High Dose Seasonal PF 12/02/2016, 12/22/2017   Influenza,inj,Quad PF,6+ Mos 12/14/2012, 01/03/2014, 11/18/2014, 12/02/2015   PFIZER(Purple Top)SARS-COV-2 Vaccination 05/16/2019, 06/08/2019   Pneumococcal Conjugate-13 12/02/2016   Pneumococcal Polysaccharide-23 07/09/2005   Td 07/09/2005   Tdap 07/29/2011   Zoster, Live 10/01/2013    Health Maintenance  Topic Date Due   Zoster Vaccines- Shingrix (1 of 2) 11/27/2021 (Originally  08/11/2001)   INFLUENZA VACCINE  12/16/2021 (Originally 10/06/2021)   TETANUS/TDAP  12/16/2021 (Originally 07/28/2021)   MAMMOGRAM  02/20/2023   COLONOSCOPY (Pts 45-59yr Insurance coverage will need to be confirmed)  01/01/2026   DEXA SCAN  Completed   Hepatitis C  Screening  Completed   HPV VACCINES  Aged Out   Pneumonia Vaccine 70 Years old  Discontinued   COVID-19 Vaccine  Discontinued  Discussed osteoporosis as well as immunizations with her and at this point she is not interested in following up on either 1 of these.  Discussed health benefits of physical activity, and encouraged her to engage in regular exercise appropriate for her age and condition.  Return in about 1 year (around 11/27/2022) for fasting cpe .     JJill Alexanders MD

## 2021-11-26 NOTE — Patient Instructions (Signed)

## 2021-11-26 NOTE — Progress Notes (Deleted)
Anna Mckinney is a 71 y.o. female who presents for annual wellness visit and follow-up on chronic medical conditions.  She has the following concerns:  Immunizations and Health Maintenance Immunization History  Administered Date(s) Administered   Fluad Quad(high Dose 65+) 12/25/2018, 12/13/2019, 01/01/2021   Influenza Split 01/12/2011, 12/17/2011   Influenza Whole 01/06/2007, 11/27/2007, 11/27/2009   Influenza, High Dose Seasonal PF 12/02/2016, 12/22/2017   Influenza,inj,Quad PF,6+ Mos 12/14/2012, 01/03/2014, 11/18/2014, 12/02/2015   PFIZER(Purple Top)SARS-COV-2 Vaccination 05/16/2019, 06/08/2019   Pneumococcal Conjugate-13 12/02/2016   Pneumococcal Polysaccharide-23 07/09/2005   Td 07/09/2005   Tdap 07/29/2011   Zoster, Live 10/01/2013   Health Maintenance Due  Topic Date Due   Zoster Vaccines- Shingrix (1 of 2) Never done   TETANUS/TDAP  07/28/2021   INFLUENZA VACCINE  10/06/2021    Last Pap smear: Last mammogram: Last colonoscopy: Last DEXA: Dentist: Ophtho: Exercise:  Other doctors caring for patient include:  Advanced directives:    Depression screen:  See questionnaire below.     02/13/2021    9:02 AM 02/04/2020    2:14 PM 12/05/2017    1:42 PM 12/02/2016    9:15 AM 12/02/2015    8:22 AM  Depression screen PHQ 2/9  Decreased Interest 0 0 0 0 0  Down, Depressed, Hopeless 0 0 0 0 0  PHQ - 2 Score 0 0 0 0 0    Fall Risk Screen: see questionnaire below.    02/13/2021    9:02 AM 02/04/2020    2:13 PM 01/31/2019    9:38 AM 12/05/2017    1:42 PM 12/02/2016    9:15 AM  Fall Risk   Falls in the past year? 0 0 0 No No  Comment   Emmi Telephone Survey: data to providers prior to load    Number falls in past yr: 0      Injury with Fall? 0      Risk for fall due to : No Fall Risks      Follow up Falls evaluation completed        ADL screen:  See questionnaire below Functional Status Survey:     Review of Systems Constitutional: -, -unexpected weight  change, -anorexia, -fatigue Allergy: -sneezing, -itching, -congestion Dermatology: denies changing moles, rash, lumps ENT: -runny nose, -ear pain, -sore throat,  Cardiology:  -chest pain, -palpitations, -orthopnea, Respiratory: -cough, -shortness of breath, -dyspnea on exertion, -wheezing,  Gastroenterology: -abdominal pain, -nausea, -vomiting, -diarrhea, -constipation, -dysphagia Hematology: -bleeding or bruising problems Musculoskeletal: -arthralgias, -myalgias, -joint swelling, -back pain, - Ophthalmology: -vision changes,  Urology: -dysuria, -difficulty urinating,  -urinary frequency, -urgency, incontinence Neurology: -, -numbness, , -memory loss, -falls, -dizziness    PHYSICAL EXAM:  There were no vitals taken for this visit.  General Appearance: Alert, cooperative, no distress, appears stated age Head: Normocephalic, without obvious abnormality, atraumatic Eyes: PERRL, conjunctiva/corneas clear, EOM's intact, fundi benign Ears: Normal TM's and external ear canals Nose: Nares normal, mucosa normal, no drainage or sinus tenderness Throat: Lips, mucosa, and tongue normal; teeth and gums normal Neck: Supple, no lymphadenopathy;  thyroid:  no enlargement/tenderness/nodules; no carotid bruit or JVD Lungs: Clear to auscultation bilaterally without wheezes, rales or ronchi; respirations unlabored Heart: Regular rate and rhythm, S1 and S2 normal, no murmur, rubor gallop Abdomen: Soft, non-tender, nondistended, normoactive bowel sounds,  no masses, no hepatosplenomegaly Extremities: No clubbing, cyanosis or edema Pulses: 2+ and symmetric all extremities Skin:  Skin color, texture, turgor normal, no rashes or lesions Lymph nodes: Cervical, supraclavicular, and  axillary nodes normal Neurologic:  CNII-XII intact, normal strength, sensation and gait; reflexes 2+ and symmetric throughout Psych: Normal mood, affect, hygiene and grooming.  ASSESSMENT/PLAN:    Discussed monthly self  breast exams and yearly mammograms; at least 30 minutes of aerobic activity at least 5 days/week and weight-bearing exercise 2x/week; proper sunscreen use reviewed; healthy diet, including goals of calcium and vitamin D intake and alcohol recommendations (less than or equal to 1 drink/day) reviewed; regular seatbelt use; changing batteries in smoke detectors.  Immunization recommendations discussed.  Colonoscopy recommendations reviewed   Medicare Attestation I have personally reviewed: The patient's medical and social history Their use of alcohol, tobacco or illicit drugs Their current medications and supplements The patient's functional ability including ADLs,fall risks, home safety risks, cognitive, and hearing and visual impairment Diet and physical activities Evidence for depression or mood disorders  The patient's weight, height, and BMI have been recorded in the chart.  I have made referrals, counseling, and provided education to the patient based on review of the above and I have provided the patient with a written personalized care plan for preventive services.     Jill Alexanders, MD   11/26/2021

## 2021-11-27 ENCOUNTER — Telehealth: Payer: Self-pay | Admitting: Licensed Clinical Social Worker

## 2021-11-27 LAB — CBC WITH DIFFERENTIAL/PLATELET
Basophils Absolute: 0.1 10*3/uL (ref 0.0–0.2)
Basos: 1 %
EOS (ABSOLUTE): 0.2 10*3/uL (ref 0.0–0.4)
Eos: 2 %
Hematocrit: 47.3 % — ABNORMAL HIGH (ref 34.0–46.6)
Hemoglobin: 16.1 g/dL — ABNORMAL HIGH (ref 11.1–15.9)
Immature Grans (Abs): 0 10*3/uL (ref 0.0–0.1)
Immature Granulocytes: 0 %
Lymphocytes Absolute: 2.3 10*3/uL (ref 0.7–3.1)
Lymphs: 36 %
MCH: 29.1 pg (ref 26.6–33.0)
MCHC: 34 g/dL (ref 31.5–35.7)
MCV: 85 fL (ref 79–97)
Monocytes Absolute: 0.7 10*3/uL (ref 0.1–0.9)
Monocytes: 10 %
Neutrophils Absolute: 3.3 10*3/uL (ref 1.4–7.0)
Neutrophils: 51 %
Platelets: 256 10*3/uL (ref 150–450)
RBC: 5.54 x10E6/uL — ABNORMAL HIGH (ref 3.77–5.28)
RDW: 13.8 % (ref 11.7–15.4)
WBC: 6.5 10*3/uL (ref 3.4–10.8)

## 2021-11-27 LAB — LIPID PANEL
Chol/HDL Ratio: 6 ratio — ABNORMAL HIGH (ref 0.0–4.4)
Cholesterol, Total: 228 mg/dL — ABNORMAL HIGH (ref 100–199)
HDL: 38 mg/dL — ABNORMAL LOW (ref >39)
LDL Chol Calc (NIH): 159 mg/dL — ABNORMAL HIGH (ref 0–99)
Triglycerides: 169 mg/dL — ABNORMAL HIGH (ref 0–149)
VLDL Cholesterol Cal: 31 mg/dL (ref 5–40)

## 2021-11-27 LAB — COMPREHENSIVE METABOLIC PANEL
ALT: 16 IU/L (ref 0–32)
AST: 23 IU/L (ref 0–40)
Albumin/Globulin Ratio: 1.8 (ref 1.2–2.2)
Albumin: 4.5 g/dL (ref 3.9–4.9)
Alkaline Phosphatase: 67 IU/L (ref 44–121)
BUN/Creatinine Ratio: 19 (ref 12–28)
BUN: 14 mg/dL (ref 8–27)
Bilirubin Total: 0.6 mg/dL (ref 0.0–1.2)
CO2: 21 mmol/L (ref 20–29)
Calcium: 10.1 mg/dL (ref 8.7–10.3)
Chloride: 103 mmol/L (ref 96–106)
Creatinine, Ser: 0.73 mg/dL (ref 0.57–1.00)
Globulin, Total: 2.5 g/dL (ref 1.5–4.5)
Glucose: 102 mg/dL — ABNORMAL HIGH (ref 70–99)
Potassium: 4.3 mmol/L (ref 3.5–5.2)
Sodium: 139 mmol/L (ref 134–144)
Total Protein: 7 g/dL (ref 6.0–8.5)
eGFR: 88 mL/min/{1.73_m2} (ref >59)

## 2021-11-27 NOTE — Patient Instructions (Signed)
Visit Information  Thank you for taking time to visit with me today. Please don't hesitate to contact me if I can be of assistance to you.   Following are the goals we discussed today:   Goals Addressed             This Visit's Progress    COMPLETED: Care Coordination Activities-No Follow Up       Care Coordination Interventions: Active listening / Reflection utilized  LCSW informed patient of care coordination services. Pt is not interested at this time and agreed to contact PCP, should needs arise LCSW informed pt of available vaccines LCSW reviewed upcoming appts with patient        If you are experiencing a Mental Health or [place] or need someone to talk to, please call the Suicide and Crisis Lifeline: 988 call 911   The patient verbalized understanding of instructions, educational materials, and care plan provided today and DECLINED offer to receive copy of patient instructions, educational materials, and care plan.   No further follow up required:    Christa See, MSW, [email or web address] Phone [phone] 12:56 PM

## 2021-11-27 NOTE — Patient Outreach (Signed)
  Care Coordination   Initial Visit Note   11/27/2021 Name: Arianny Pun MRN: [id number] DOB: 06-Sep-1950  Zanovia Rotz is a 71 y.o. year old female who sees Denita Lung, MD for primary care. I spoke with  Pearlean Brownie by phone today.  What matters to the patients health and wellness today?  Care Coordination    Goals Addressed             This Visit's Progress    COMPLETED: Care Coordination Activities-No Follow Up       Care Coordination Interventions: Active listening / Reflection utilized  LCSW informed patient of care coordination services. Pt is not interested at this time and agreed to contact PCP, should needs arise LCSW informed pt of available vaccines LCSW reviewed upcoming appts with patient        SDOH assessments and interventions completed:  No     Care Coordination Interventions Activated:  Yes  Care Coordination Interventions:  Yes, provided   Follow up plan: No further intervention required.   Encounter Outcome:  Pt. Refused   Christa See, MSW, [email or web address] Phone [phone] 12:55 PM

## 2022-01-01 ENCOUNTER — Other Ambulatory Visit (INDEPENDENT_AMBULATORY_CARE_PROVIDER_SITE_OTHER): Payer: PPO

## 2022-01-01 DIAGNOSIS — Z23 Encounter for immunization: Secondary | ICD-10-CM

## 2022-03-02 ENCOUNTER — Telehealth (INDEPENDENT_AMBULATORY_CARE_PROVIDER_SITE_OTHER): Payer: PPO | Admitting: Medical

## 2022-03-02 ENCOUNTER — Encounter: Payer: Self-pay | Admitting: Medical

## 2022-03-02 VITALS — BP 122/78 | Ht 64.0 in | Wt 142.0 lb

## 2022-03-02 DIAGNOSIS — R6889 Other general symptoms and signs: Secondary | ICD-10-CM

## 2022-03-02 DIAGNOSIS — R051 Acute cough: Secondary | ICD-10-CM | POA: Diagnosis not present

## 2022-03-02 MED ORDER — OSELTAMIVIR PHOSPHATE 75 MG PO CAPS: 75.0000 mg | ORAL_CAPSULE | Freq: Two times a day (BID) | ORAL | 0 refills | Status: DC

## 2022-03-02 MED ORDER — ALBUTEROL SULFATE 2 MG/5ML PO SYRP: 3.0000 mg | ORAL_SOLUTION | Freq: Three times a day (TID) | ORAL | 1 refills | Status: DC

## 2022-03-02 MED ORDER — PROMETHAZINE-DM 6.25-15 MG/5ML PO SYRP: 5.0000 mL | ORAL_SOLUTION | Freq: Four times a day (QID) | ORAL | 0 refills | Status: DC | PRN

## 2022-03-02 NOTE — Progress Notes (Signed)
Subjective:     Patient ID: Anna Mckinney, female   DOB: Dec 10, 1950, 71 y.o.   MRN: [id number]  This visit type was conducted due to national recommendations for restrictions regarding the COVID-19 Pandemic (e.g. social distancing) in an effort to limit this patient's exposure and mitigate transmission in our community.  Due to their co-morbid illnesses, this patient is at least at moderate risk for complications without adequate follow up.  This format is felt to be most appropriate for this patient at this time.    Documentation for virtual audio and video telecommunications through [place] encounter:  The patient was located at home. The provider was located in the office. The patient did consent to this visit and is aware of possible charges through their insurance for this visit.  The other persons participating in this telemedicine service were none. Time spent on call was 20 minutes and in review of previous records 20 minutes total.  This virtual service is not related to other E/M service within previous 7 days.   HPI Chief Complaint  Patient presents with   other    Cough, congestion, started yesterday or late Sunday, dry throat, no fever, no lose of taste,    Virtual for illness.  Has cough, congestion, rattly in chest, feels malaise, dry throat.   No fever.  Does have body aches and some chills.  No sweats.   Symptoms began 2 days ago.   Was around her sister recently that had sinus infection. No loss of taste or smell.  No other sick contacts.  Using tylenol, hot tea.  Using robitussin honey cough and chest DM, OTC for cough and congestion.   No NVD.  Has an inhaler but feel like it makes her throat burn.  No other aggravating or relieving factors.  No other complaint.   Past Medical History:  Diagnosis Date   Barrett's esophagus     [place] (Dr. Ferdinand Lango)   Blood transfusion without reported diagnosis    Cataract    bilateral-removed   Gastritis    GERD  (gastroesophageal reflux disease)    Hemorrhoid    Hyperplastic colon polyp 11/23/2010   Dr Virgel Bouquet   Insomnia    Internal prolapsed hemorrhoids s/p THD hemorrhoidal ligation/pexy 10/16/2012   Osteopenia    PONV (postoperative nausea and vomiting)    PT'S HEART RATE DOWN TO 40 WITH HER LAST SURGERY - SHE REMEMBERS BEING ASKED TO WAKE UP SO HEART RATE WOULD GO UP - SCARED HER.   Rectal prolapse    Sleep apnea 2015   mild, pt denies 01/02/19   Ulcer of esophagus 10/08/2009   Dr Virgel Bouquet   Current Outpatient Medications on File Prior to Visit  Medication Sig Dispense Refill   Ascorbic Acid (VITAMIN C ER PO) Take by mouth.     Cholecalciferol (VITAMIN D3) 5000 UNITS TABS Take 1 tablet by mouth daily.     Misc Natural Products (ELDERBERRY ZINC/VIT C/IMMUNE MT) Use as directed in the mouth or throat.     Probiotic Product (PRO-BIOTIC BLEND PO) Take by mouth.     Specialty Vitamins Products [facility]) CAPS Take by mouth.     zolpidem (AMBIEN) 5 MG tablet Take 1 tablet (5 mg total) by mouth at bedtime. 90 tablet 1   albuterol (VENTOLIN HFA) 108 (90 Base) MCG/ACT inhaler Inhale 2 puffs into the lungs every 6 (six) hours as needed for wheezing or shortness of breath. (Patient not taking: Reported on 03/17/2021) 8 g  0   azithromycin (ZITHROMAX) 250 MG tablet 2 tablets day 1, then 1 tablet days 2-4 (Patient not taking: Reported on 03/17/2021) 6 tablet 0   celecoxib (CELEBREX) 200 MG capsule Take 1 capsule (200 mg total) by mouth 2 (two) times daily. (Patient not taking: Reported on 01/13/2021) 20 capsule 0   diclofenac (VOLTAREN) 75 MG EC tablet Take 1 tablet (75 mg total) by mouth 2 (two) times daily. (Patient not taking: Reported on 01/13/2021) 30 tablet 0   esomeprazole (NEXIUM) 40 MG capsule Take 1 capsule (40 mg total) by mouth as needed. (Patient not taking: Reported on 11/26/2021) 90 capsule 3   meclizine (ANTIVERT) 12.5 MG tablet Take 1 tablet (12.5 mg total) by mouth 3 (three) times daily  as needed for dizziness. (Patient not taking: Reported on 01/13/2021) 20 tablet 0   methocarbamol (ROBAXIN) 500 MG tablet Take 1 tablet (500 mg total) by mouth every 8 (eight) hours as needed for muscle spasms. (Patient not taking: Reported on 01/13/2021) 20 tablet 0   promethazine-dextromethorphan (PROMETHAZINE-DM) 6.25-15 MG/5ML syrup Take 5 mLs by mouth 4 (four) times daily as needed for cough. (Patient not taking: Reported on 03/17/2021) 120 mL 0   UPNEEQ 0.1 % SOLN Instill 1 drop into both eyes once a day (Patient not taking: Reported on 03/17/2021)     No current facility-administered medications on file prior to visit.    Review of Systems As in subjective    Objective:   Physical Exam Due to coronavirus pandemic stay at home measures, patient visit was virtual and they were not examined in person.   BP 122/78   Ht '5\' 4"'$  (1.626 m)   Wt 142 lb (64.4 kg)   BMI 24.37 kg/m   General: Well-developed, well-nourished, no acute distress, somewhat ill-appearing Coughing periodically No labored breathing or wheezing witnessed      Assessment:     Encounter Diagnoses  Name Primary?   Flu-like symptoms Yes   Acute cough        Plan:     We discussed her symptoms, possible illness etiology.  I suspect flu given what we see in the community currently given her current symptoms and rapid onset.  I recommended she come in for testing for COVID and flu but she declines.  Advised rest, hydration, begin Tamiflu for suspected flu.  Can use Promethazine DM for cough.  She did not tolerate other cough syrups very well.  She has not tolerated inhaled version of albuterol so we will try little liquid albuterol given what her cough sounds today and given her symptoms.  Discussed proper use of medications.  If much worse in the next few days, or further questions or new symptoms then call back or get reevaluated.  If much worse in coming days go to the hospital.  Raigen was seen today for  other.  Diagnoses and all orders for this visit:  Flu-like symptoms  Acute cough  Other orders -     promethazine-dextromethorphan (PROMETHAZINE-DM) 6.25-15 MG/5ML syrup; Take 5 mLs by mouth 4 (four) times daily as needed for cough. -     oseltamivir (TAMIFLU) 75 MG capsule; Take 1 capsule (75 mg total) by mouth 2 (two) times daily. -     albuterol (VENTOLIN) 2 MG/5ML syrup; Take 7.5 mLs (3 mg total) by mouth 3 (three) times daily. prn  F/u prn

## 2022-03-05 ENCOUNTER — Telehealth (INDEPENDENT_AMBULATORY_CARE_PROVIDER_SITE_OTHER): Payer: PPO | Admitting: Medical

## 2022-03-05 VITALS — Temp 97.8°F | Wt 142.0 lb

## 2022-03-05 DIAGNOSIS — R051 Acute cough: Secondary | ICD-10-CM

## 2022-03-05 DIAGNOSIS — R6889 Other general symptoms and signs: Secondary | ICD-10-CM | POA: Diagnosis not present

## 2022-03-05 MED ORDER — PREDNISONE 20 MG PO TABS: 40.0000 mg | ORAL_TABLET | Freq: Every day | ORAL | 0 refills | Status: AC

## 2022-03-05 MED ORDER — BENZONATATE 200 MG PO CAPS: 200.0000 mg | ORAL_CAPSULE | Freq: Three times a day (TID) | ORAL | 0 refills | Status: DC | PRN

## 2022-03-05 MED ORDER — AZITHROMYCIN 250 MG PO TABS: ORAL_TABLET | ORAL | 0 refills | Status: DC

## 2022-03-05 NOTE — Progress Notes (Signed)
Subjective:     Patient ID: Anna Mckinney, female   DOB: Aug 01, 1950, 71 y.o.   MRN: [id number]  This visit type was conducted due to national recommendations for restrictions regarding the COVID-19 Pandemic (e.g. social distancing) in an effort to limit this patient's exposure and mitigate transmission in our community.  Due to their co-morbid illnesses, this patient is at least at moderate risk for complications without adequate follow up.  This format is felt to be most appropriate for this patient at this time.    Documentation for virtual audio and video telecommunications through [place] encounter:  The patient was located at home. The provider was located in the office. The patient did consent to this visit and is aware of possible charges through their insurance for this visit.  The other persons participating in this telemedicine service were none. Time spent on call was 20 minutes and in review of previous records 20 minutes total.  This virtual service IS related to other E/M service within previous 7 days.   HPI Chief Complaint  Patient presents with   other    Bad cough and congestion, no fever, clear phlegm, can't stop causing, throat gets tight    Virtual for illness.  We did a virtual consult earlier in the week for the same.  She notes that she is worse, not any improvement at all despite using cough syrup and Tamiflu this week.  She is having worse cough not really seen improvement with the Promethazine DM.  Still has cough, congestion, rattly chest, dry throat, some aches, some chills.  Is a little short of breath.  She used liquid albuterol as she has not tolerated inhaled albuterol in the past but she does not feel a bunch of improvement on the liquid albuterol either. No other aggravating or relieving factors.  No other complaint.   Past Medical History:  Diagnosis Date   Barrett's esophagus     [place] (Dr. Ferdinand Lango)   Blood transfusion without reported  diagnosis    Cataract    bilateral-removed   Gastritis    GERD (gastroesophageal reflux disease)    Hemorrhoid    Hyperplastic colon polyp 11/23/2010   Dr Virgel Bouquet   Insomnia    Internal prolapsed hemorrhoids s/p THD hemorrhoidal ligation/pexy 10/16/2012   Osteopenia    PONV (postoperative nausea and vomiting)    PT'S HEART RATE DOWN TO 40 WITH HER LAST SURGERY - SHE REMEMBERS BEING ASKED TO WAKE UP SO HEART RATE WOULD GO UP - SCARED HER.   Rectal prolapse    Sleep apnea 2015   mild, pt denies 01/02/19   Ulcer of esophagus 10/08/2009   Dr Virgel Bouquet   Current Outpatient Medications on File Prior to Visit  Medication Sig Dispense Refill   albuterol (VENTOLIN) 2 MG/5ML syrup Take 7.5 mLs (3 mg total) by mouth 3 (three) times daily. prn 120 mL 1   Ascorbic Acid (VITAMIN C ER PO) Take by mouth.     celecoxib (CELEBREX) 200 MG capsule Take 1 capsule (200 mg total) by mouth 2 (two) times daily. 20 capsule 0   Cholecalciferol (VITAMIN D3) 5000 UNITS TABS Take 1 tablet by mouth daily.     diclofenac (VOLTAREN) 75 MG EC tablet Take 1 tablet (75 mg total) by mouth 2 (two) times daily. 30 tablet 0   Misc Natural Products (ELDERBERRY ZINC/VIT C/IMMUNE MT) Use as directed in the mouth or throat.     oseltamivir (TAMIFLU) 75 MG capsule Take  1 capsule (75 mg total) by mouth 2 (two) times daily. 10 capsule 0   Probiotic Product (PRO-BIOTIC BLEND PO) Take by mouth.     promethazine-dextromethorphan (PROMETHAZINE-DM) 6.25-15 MG/5ML syrup Take 5 mLs by mouth 4 (four) times daily as needed for cough. 120 mL 0   Specialty Vitamins Products [facility]) CAPS Take by mouth.     UPNEEQ 0.1 % SOLN      zolpidem (AMBIEN) 5 MG tablet Take 1 tablet (5 mg total) by mouth at bedtime. 90 tablet 1   albuterol (VENTOLIN HFA) 108 (90 Base) MCG/ACT inhaler Inhale 2 puffs into the lungs every 6 (six) hours as needed for wheezing or shortness of breath. (Patient not taking: Reported on 03/17/2021) 8 g 0   azithromycin  (ZITHROMAX) 250 MG tablet 2 tablets day 1, then 1 tablet days 2-4 (Patient not taking: Reported on 03/17/2021) 6 tablet 0   esomeprazole (NEXIUM) 40 MG capsule Take 1 capsule (40 mg total) by mouth as needed. (Patient not taking: Reported on 03/05/2022) 90 capsule 3   meclizine (ANTIVERT) 12.5 MG tablet Take 1 tablet (12.5 mg total) by mouth 3 (three) times daily as needed for dizziness. (Patient not taking: Reported on 01/13/2021) 20 tablet 0   methocarbamol (ROBAXIN) 500 MG tablet Take 1 tablet (500 mg total) by mouth every 8 (eight) hours as needed for muscle spasms. (Patient not taking: Reported on 01/13/2021) 20 tablet 0   promethazine-dextromethorphan (PROMETHAZINE-DM) 6.25-15 MG/5ML syrup Take 5 mLs by mouth 4 (four) times daily as needed for cough. (Patient not taking: Reported on 03/17/2021) 120 mL 0   No current facility-administered medications on file prior to visit.    Review of Systems As in subjective    Objective:   Physical Exam Due to coronavirus pandemic stay at home measures, patient visit was virtual and they were not examined in person.   Temp 97.8 F (36.6 C)   Wt 142 lb (64.4 kg)   BMI 24.37 kg/m  Home pulse oximeter 96% room air   General: Well-developed, well-nourished, no acute distress, somewhat ill-appearing Coughing a lot No labored breathing or wheezing witnessed      Assessment:     Encounter Diagnoses  Name Primary?   Acute cough Yes   Flu-like symptoms        Plan:     We discussed her symptoms, possible illness etiology.  When we spoke earlier in the week she was not able to come in for testing although I recommended flu and COVID testing.  We presume flu given the symptoms.  She has used Tamiflu, using the promethazine cough syrup and liquid albuterol but not improved.  I asked her to go get a chest x-ray.  We discussed details on doing this today.  Change in therapy as below with medication since she is not responding to the current therapy.   Continue to work on good fluid intake and rest.  If worse over the weekend go to the hospital  Anna Mckinney was seen today for other.  Diagnoses and all orders for this visit:  Acute cough -     DG Chest 2 View; Future  Flu-like symptoms -     DG Chest 2 View; Future  Other orders -     azithromycin (ZITHROMAX) 250 MG tablet; 2 tablets day 1, then 1 tablet days 2-4 -     benzonatate (TESSALON) 200 MG capsule; Take 1 capsule (200 mg total) by mouth 3 (three) times daily as needed for cough. -  predniSONE (DELTASONE) 20 MG tablet; Take 2 tablets (40 mg total) by mouth daily with breakfast for 3 days.  F/u pending x-ray

## 2022-03-12 ENCOUNTER — Telehealth: Payer: Self-pay | Admitting: Family Medicine

## 2022-03-12 ENCOUNTER — Other Ambulatory Visit: Payer: Self-pay | Admitting: Medical

## 2022-03-12 MED ORDER — PROMETHAZINE-DM 6.25-15 MG/5ML PO SYRP: 5.0000 mL | ORAL_SOLUTION | Freq: Four times a day (QID) | ORAL | 0 refills | Status: DC | PRN

## 2022-03-12 NOTE — Telephone Encounter (Signed)
Pt called wants refill promethatzine cough syrup  Cough better but still there

## 2022-04-06 ENCOUNTER — Other Ambulatory Visit: Payer: Self-pay | Admitting: Family Medicine

## 2022-04-06 DIAGNOSIS — Z1231 Encounter for screening mammogram for malignant neoplasm of breast: Secondary | ICD-10-CM

## 2022-04-24 ENCOUNTER — Encounter (HOSPITAL_BASED_OUTPATIENT_CLINIC_OR_DEPARTMENT_OTHER): Payer: Self-pay | Admitting: Emergency Medicine

## 2022-04-24 ENCOUNTER — Emergency Department (HOSPITAL_BASED_OUTPATIENT_CLINIC_OR_DEPARTMENT_OTHER): Payer: PPO

## 2022-04-24 ENCOUNTER — Other Ambulatory Visit: Payer: Self-pay

## 2022-04-24 ENCOUNTER — Emergency Department (HOSPITAL_BASED_OUTPATIENT_CLINIC_OR_DEPARTMENT_OTHER)
Admission: EM | Admit: 2022-04-24 | Discharge: 2022-04-24 | Disposition: A | Discharge status: Home / Self Care | Payer: PPO | Attending: Emergency Medicine | Admitting: Emergency Medicine

## 2022-04-24 DIAGNOSIS — I700 Atherosclerosis of aorta: Secondary | ICD-10-CM | POA: Diagnosis not present

## 2022-04-24 DIAGNOSIS — N200 Calculus of kidney: Secondary | ICD-10-CM

## 2022-04-24 DIAGNOSIS — K449 Diaphragmatic hernia without obstruction or gangrene: Secondary | ICD-10-CM | POA: Diagnosis not present

## 2022-04-24 DIAGNOSIS — R109 Unspecified abdominal pain: Secondary | ICD-10-CM | POA: Diagnosis present

## 2022-04-24 DIAGNOSIS — K5730 Diverticulosis of large intestine without perforation or abscess without bleeding: Secondary | ICD-10-CM | POA: Diagnosis not present

## 2022-04-24 DIAGNOSIS — R9431 Abnormal electrocardiogram [ECG] [EKG]: Secondary | ICD-10-CM | POA: Diagnosis not present

## 2022-04-24 DIAGNOSIS — N132 Hydronephrosis with renal and ureteral calculous obstruction: Secondary | ICD-10-CM | POA: Diagnosis not present

## 2022-04-24 LAB — URINALYSIS, MICROSCOPIC (REFLEX): RBC / HPF: >50 RBC/hpf (ref 0–5)

## 2022-04-24 LAB — URINALYSIS, ROUTINE W REFLEX MICROSCOPIC
Bilirubin Urine: NEGATIVE
Glucose, UA: NEGATIVE mg/dL
Ketones, ur: 15 mg/dL — AB
Leukocytes,Ua: NEGATIVE
Nitrite: NEGATIVE
Protein, ur: 100 mg/dL — AB
Specific Gravity, Urine: >=1.03 (ref 1.005–1.030)
pH: 5 (ref 5.0–8.0)

## 2022-04-24 MED ORDER — ONDANSETRON 4 MG PO TBDP: ORAL_TABLET | ORAL | 0 refills | Status: DC

## 2022-04-24 MED ORDER — MORPHINE SULFATE (PF) 4 MG/ML IV SOLN
4.0000 mg | Freq: Once | INTRAVENOUS | Status: AC
  Administered 2022-04-24: 4 mg via INTRAVENOUS
  Filled 2022-04-24: qty 1

## 2022-04-24 MED ORDER — SODIUM CHLORIDE 0.9 % IV SOLN
2.0000 g | Freq: Once | INTRAVENOUS | Status: AC
  Administered 2022-04-24: 2 g via INTRAVENOUS
  Filled 2022-04-24: qty 20

## 2022-04-24 MED ORDER — KETOROLAC TROMETHAMINE 15 MG/ML IJ SOLN
15.0000 mg | Freq: Once | INTRAMUSCULAR | Status: AC
  Administered 2022-04-24: 15 mg via INTRAVENOUS
  Filled 2022-04-24: qty 1

## 2022-04-24 MED ORDER — TAMSULOSIN HCL 0.4 MG PO CAPS: 0.4000 mg | ORAL_CAPSULE | Freq: Every day | ORAL | 0 refills | Status: DC

## 2022-04-24 MED ORDER — ONDANSETRON HCL 4 MG/2ML IJ SOLN
4.0000 mg | Freq: Once | INTRAMUSCULAR | Status: AC
  Administered 2022-04-24: 4 mg via INTRAVENOUS
  Filled 2022-04-24: qty 2

## 2022-04-24 MED ORDER — MORPHINE SULFATE 15 MG PO TABS: 7.5000 mg | ORAL_TABLET | ORAL | 0 refills | Status: DC | PRN

## 2022-04-24 MED ORDER — SODIUM CHLORIDE 0.9 % IV BOLUS
1000.0000 mL | Freq: Once | INTRAVENOUS | Status: AC
  Administered 2022-04-24: 1000 mL via INTRAVENOUS

## 2022-04-24 MED ORDER — CEFPODOXIME PROXETIL 100 MG PO TABS: 100.0000 mg | ORAL_TABLET | Freq: Two times a day (BID) | ORAL | 0 refills | Status: AC

## 2022-04-24 MED ORDER — CEFPODOXIME PROXETIL 100 MG PO TABS: 100.0000 mg | ORAL_TABLET | Freq: Two times a day (BID) | ORAL | 0 refills | Status: DC

## 2022-04-24 MED ORDER — ALUM & MAG HYDROXIDE-SIMETH 200-200-20 MG/5ML PO SUSP
30.0000 mL | Freq: Once | ORAL | Status: AC
  Administered 2022-04-24: 30 mL via ORAL
  Filled 2022-04-24: qty 30

## 2022-04-24 NOTE — Discharge Instructions (Addendum)
Follow-up with urologist.  Return for fevers, uncontrolled pain. 6 Take 4 over the counter ibuprofen tablets 3 times a day or 2 over-the-counter naproxen tablets twice a day for pain. Also take tylenol 1055m(2 extra strength) four times a day.   Then take the pain medicine if you feel like you need it. Narcotics do not help with the pain, they only make you care about it less.  You can become addicted to this, people may break into your house to steal it.  It will constipate you.  If you drive under the influence of this medicine you can get a DUI.

## 2022-04-24 NOTE — ED Notes (Signed)
Patient found with rocephin infusion paused due to occlusion, infusion restarted, patient reports continuing pain and discomfort, DO Floyd notified.

## 2022-04-24 NOTE — ED Notes (Signed)
Patient now reporting heartburn and requesting more maalox. DO Floyd notified.

## 2022-04-24 NOTE — ED Provider Notes (Signed)
[facility] Provider Note   CSN: [id number] Arrival date & time: 04/24/22  1500     History  Chief Complaint  Patient presents with   Flank Pain    Anna Mckinney is a 72 y.o. female.  72 yo  F with a chief complaint of left flank pain.  Started suddenly about 2 hours ago.  Feels like her prior kidney stones.  She has trouble finding a comfortable position.  No fevers no urinary symptoms.   Flank Pain       Home Medications Prior to Admission medications   Medication Sig Start Date End Date Taking? Authorizing Provider  cefpodoxime (VANTIN) 100 MG tablet Take 1 tablet (100 mg total) by mouth 2 (two) times daily for 10 days. 04/24/22 05/04/22 Yes Deno Etienne, DO  morphine (MSIR) 15 MG tablet Take 0.5 tablets (7.5 mg total) by mouth every 4 (four) hours as needed for severe pain. 04/24/22  Yes Deno Etienne, DO  ondansetron (ZOFRAN-ODT) 4 MG disintegrating tablet 45m ODT q4 hours prn nausea/vomit 04/24/22  Yes FDeno Etienne DO  tamsulosin (FLOMAX) 0.4 MG CAPS capsule Take 1 capsule (0.4 mg total) by mouth daily after supper. 04/24/22  Yes FDeno Etienne DO  albuterol (VENTOLIN HFA) 108 (90 Base) MCG/ACT inhaler Inhale 2 puffs into the lungs every 6 (six) hours as needed for wheezing or shortness of breath. Patient not taking: Reported on 03/17/2021 02/13/21   Tysinger, DCamelia Eng PA-C  albuterol (VENTOLIN) 2 MG/5ML syrup Take 7.5 mLs (3 mg total) by mouth 3 (three) times daily. prn 03/02/22   Tysinger, DCamelia Eng PA-C  Ascorbic Acid (VITAMIN C ER PO) Take by mouth.    [provider]  azithromycin (ZITHROMAX) 250 MG tablet 2 tablets day 1, then 1 tablet days 2-4 Patient not taking: Reported on 03/17/2021 02/13/21   Tysinger, DCamelia Eng PA-C  azithromycin (ZITHROMAX) 250 MG tablet 2 tablets day 1, then 1 tablet days 2-4 03/05/22   Tysinger, DCamelia Eng PA-C  benzonatate (TESSALON) 200 MG capsule Take 1 capsule (200 mg total) by mouth 3 (three)  times daily as needed for cough. 03/05/22   Tysinger, DCamelia Eng PA-C  celecoxib (CELEBREX) 200 MG capsule Take 1 capsule (200 mg total) by mouth 2 (two) times daily. 07/15/20   LDenita Lung MD  Cholecalciferol (VITAMIN D3) 5000 UNITS TABS Take 1 tablet by mouth daily.    [provider]  diclofenac (VOLTAREN) 75 MG EC tablet Take 1 tablet (75 mg total) by mouth 2 (two) times daily. 08/07/20   LDenita Lung MD  esomeprazole (NEXIUM) 40 MG capsule Take 1 capsule (40 mg total) by mouth as needed. Patient not taking: Reported on 03/05/2022 02/04/20   LDenita Lung MD  meclizine (ANTIVERT) 12.5 MG tablet Take 1 tablet (12.5 mg total) by mouth 3 (three) times daily as needed for dizziness. Patient not taking: Reported on 01/13/2021 04/02/20   KRita Ohara MD  methocarbamol (ROBAXIN) 500 MG tablet Take 1 tablet (500 mg total) by mouth every 8 (eight) hours as needed for muscle spasms. Patient not taking: Reported on 01/13/2021 07/15/20   LDenita Lung MD  Misc Natural Products ([facility] LLC[facility]INC/VIT C/IMMUNE MT) Use as directed in the mouth or throat.    [provider]  oseltamivir (TAMIFLU) 75 MG capsule Take 1 capsule (75 mg total) by mouth 2 (two) times daily. 03/02/22   Tysinger, DCamelia Eng PA-C  Probiotic Product (PRO-BIOTIC BLEND PO) Take  by mouth.    [provider]  promethazine-dextromethorphan (PROMETHAZINE-DM) 6.25-15 MG/5ML syrup Take 5 mLs by mouth 4 (four) times daily as needed for cough. 03/02/22   Tysinger, Camelia Eng, PA-C  promethazine-dextromethorphan (PROMETHAZINE-DM) 6.25-15 MG/5ML syrup Take 5 mLs by mouth 4 (four) times daily as needed for cough. 03/12/22   Tysinger, Camelia Eng, PA-C  Specialty Vitamins Products [facility]) CAPS Take by mouth.    [provider]  UPNEEQ 0.1 % SOLN  03/12/20   [provider]  zolpidem (AMBIEN) 5 MG tablet Take 1 tablet (5 mg total) by mouth at bedtime. 11/26/21   Denita Lung, MD      Allergies    Hydrocodone,  Codeine, Doxycycline, and Oxycodone hcl    Review of Systems   Review of Systems  Genitourinary:  Positive for flank pain.    Physical Exam Updated Vital Signs BP (!) 146/73   Pulse 77   Temp 98 F (36.7 C) (Oral)   Resp 17   SpO2 99%  Physical Exam Vitals and nursing note reviewed.  Constitutional:      General: She is not in acute distress.    Appearance: She is well-developed. She is not diaphoretic.  HENT:     Head: Normocephalic and atraumatic.  Eyes:     Pupils: Pupils are equal, round, and reactive to light.  Cardiovascular:     Rate and Rhythm: Normal rate and regular rhythm.     Heart sounds: No murmur heard.    No friction rub. No gallop.  Pulmonary:     Effort: Pulmonary effort is normal.     Breath sounds: No wheezing or rales.  Abdominal:     General: There is no distension.     Palpations: Abdomen is soft.     Tenderness: There is no abdominal tenderness.  Musculoskeletal:        General: No tenderness.     Cervical back: Normal range of motion and neck supple.  Skin:    General: Skin is warm and dry.  Neurological:     Mental Status: She is alert and oriented to person, place, and time.  Psychiatric:        Behavior: Behavior normal.     ED Results / Procedures / Treatments   Labs (all labs ordered are listed, but only abnormal results are displayed) Labs Reviewed  URINALYSIS, ROUTINE W REFLEX MICROSCOPIC - Abnormal; Notable for the following components:      Result Value   Color, Urine AMBER (*)    APPearance CLOUDY (*)    Hgb urine dipstick LARGE (*)    Ketones, ur 15 (*)    Protein, ur 100 (*)    All other components within normal limits  URINALYSIS, MICROSCOPIC (REFLEX) - Abnormal; Notable for the following components:   Bacteria, UA MANY (*)    All other components within normal limits    EKG EKG Interpretation  Date/Time:  Saturday April 24 2022 18:45:01 EST Ventricular Rate:  75 PR Interval:  185 QRS Duration: 87 QT  Interval:  419 QTC Calculation: 468 R Axis:   48 Text Interpretation: Sinus rhythm Low voltage, precordial leads Anteroseptal infarct, old Nonspecific T abnormalities, lateral leads No significant change since last tracing Confirmed by Deno Etienne [id number]) on 04/24/2022 6:51:31 PM  Radiology CT Renal Stone Study  Result Date: 04/24/2022 CLINICAL DATA:  Left flank pain EXAM: CT ABDOMEN AND PELVIS WITHOUT CONTRAST TECHNIQUE: Multidetector CT imaging of the abdomen and pelvis was performed following  the standard protocol without IV contrast. RADIATION DOSE REDUCTION: This exam was performed according to the departmental dose-optimization program which includes automated exposure control, adjustment of the mA and/or kV according to patient size and/or use of iterative reconstruction technique. COMPARISON:  11/03/2015 FINDINGS: Lower chest: Clear. No pleural effusion or pericardial effusion. Small hiatal hernia. Hepatobiliary: Scattered subcentimeter hypodensities are too small to be characterize by CT. Status post cholecystectomy. No biliary dilatation. Pancreas: Unremarkable. No pancreatic ductal dilatation or surrounding inflammatory changes. Spleen: Normal in size without focal abnormality. Adrenals/Urinary Tract: No adrenal lesions. On the right there is no hydronephrosis. There is a lower pole 4 mm right-sided renal stone. On the left there is hydronephrosis and hydroureter with a mid left ureteral 5 mm stone. There is an intrarenal stone measuring about a mm on the left. Urinary bladder was nearly empty and otherwise unremarkable. Stomach/Bowel: Stomach is within normal limits. Appendix appears normal. No evidence of bowel wall thickening, distention, or inflammatory changes. There is sigmoid diverticulosis with no evidence of diverticulitis Vascular/Lymphatic: Aortic atherosclerosis. No enlarged abdominal or pelvic lymph nodes. Reproductive: Status post hysterectomy. No adnexal masses. Other: No abdominal  wall hernia or abnormality. No abdominopelvic ascites. Musculoskeletal: Thoracolumbosacral degenerative changes are noted. IMPRESSION: 1. Bilateral nephrolithiasis. 2. Left-sided hydronephrosis with a mid left ureteral stone. 3. Small hiatal hernia. 4. Diverticulosis. Electronically Signed   By: Sammie Bench M.D.   On: 04/24/2022 15:37    Procedures Procedures    Medications Ordered in ED Medications  morphine (PF) 4 MG/ML injection 4 mg (has no administration in time range)  ondansetron (ZOFRAN) injection 4 mg (has no administration in time range)  morphine (PF) 4 MG/ML injection 4 mg (4 mg Intravenous Given 04/24/22 1527)  ondansetron (ZOFRAN) injection 4 mg (4 mg Intravenous Given 04/24/22 1527)  sodium chloride 0.9 % bolus 1,000 mL (0 mLs Intravenous Stopped 04/24/22 1612)  morphine (PF) 4 MG/ML injection 4 mg (4 mg Intravenous Given 04/24/22 1616)  ondansetron (ZOFRAN) injection 4 mg (4 mg Intravenous Given 04/24/22 1613)  ketorolac (TORADOL) 15 MG/ML injection 15 mg (15 mg Intravenous Given 04/24/22 1614)  alum & mag hydroxide-simeth (MAALOX/MYLANTA) 200-200-20 MG/5ML suspension 30 mL (30 mLs Oral Given 04/24/22 1847)  cefTRIAXone (ROCEPHIN) 2 g in sodium chloride 0.9 % 100 mL IVPB ( Intravenous Restarted 04/24/22 1921)    ED Course/ Medical Decision Making/ A&P                             Medical Decision Making Amount and/or Complexity of Data Reviewed Labs: ordered. Radiology: ordered. ECG/medicine tests: ordered.  Risk OTC drugs. Prescription drug management.   72 yo F with a chief complaint of left flank pain.  Going on for about 2 hours.  Presenting like her kidney stones have in the past.  Will obtain a CT stone study.  Aggressively treat pain and nausea.  IV fluids.  UA.  Reassess.  Patient is feeling a bit better on repeat assessment.  She tells me that she has developed some epigastric burning after vomiting.  Will obtain a screening EKG give her a dose of  Maalox.  UA is resulted and despite being nitrite and leukocyte esterase negative and shows too numerous to count bacteria.  She has had no fevers here again endorses no obvious urinary symptoms.  Will discuss with urology.  I the case with Dr. Alyson Ingles, he agreed a trial of outpatient antibiotics and close follow-up in the  office was reasonable.  Patient's pain is improved.  EKG is nonischemic.  Will discharge home.  Urology follow-up.  7:35 PM:  I have discussed the diagnosis/risks/treatment options with the patient.  Evaluation and diagnostic testing in the emergency department does not suggest an emergent condition requiring admission or immediate intervention beyond what has been performed at this time.  They will follow up with Urology. We also discussed returning to the ED immediately if new or worsening sx occur. We discussed the sx which are most concerning (e.g., sudden worsening pain, fever, inability to tolerate by mouth) that necessitate immediate return. Medications administered to the patient during their visit and any new prescriptions provided to the patient are listed below.  Medications given during this visit Medications  morphine (PF) 4 MG/ML injection 4 mg (has no administration in time range)  ondansetron (ZOFRAN) injection 4 mg (has no administration in time range)  morphine (PF) 4 MG/ML injection 4 mg (4 mg Intravenous Given 04/24/22 1527)  ondansetron (ZOFRAN) injection 4 mg (4 mg Intravenous Given 04/24/22 1527)  sodium chloride 0.9 % bolus 1,000 mL (0 mLs Intravenous Stopped 04/24/22 1612)  morphine (PF) 4 MG/ML injection 4 mg (4 mg Intravenous Given 04/24/22 1616)  ondansetron (ZOFRAN) injection 4 mg (4 mg Intravenous Given 04/24/22 1613)  ketorolac (TORADOL) 15 MG/ML injection 15 mg (15 mg Intravenous Given 04/24/22 1614)  alum & mag hydroxide-simeth (MAALOX/MYLANTA) 200-200-20 MG/5ML suspension 30 mL (30 mLs Oral Given 04/24/22 1847)  cefTRIAXone (ROCEPHIN) 2 g in sodium  chloride 0.9 % 100 mL IVPB ( Intravenous Restarted 04/24/22 1921)     The patient appears reasonably screen and/or stabilized for discharge and I doubt any other medical condition or other [facility] requiring further screening, evaluation, or treatment in the ED at this time prior to discharge.          Final Clinical Impression(s) / ED Diagnoses Final diagnoses:  Kidney stone    Rx / DC Orders ED Discharge Orders          Ordered    ondansetron (ZOFRAN-ODT) 4 MG disintegrating tablet        04/24/22 1855    morphine (MSIR) 15 MG tablet  Every 4 hours PRN        04/24/22 1855    tamsulosin (FLOMAX) 0.4 MG CAPS capsule  Daily after supper        04/24/22 1855    cefpodoxime (VANTIN) 100 MG tablet  2 times daily        04/24/22 1855              Deno Etienne, DO 04/24/22 1935

## 2022-04-24 NOTE — ED Notes (Signed)
Reviewed AVS with patient, patient expressed understanding of directions, denies further questions at this time.

## 2022-04-24 NOTE — ED Triage Notes (Signed)
Left flank pain x 2 hours , obvious distress , Hx kidney stones . Nausea and emesis .

## 2022-04-26 ENCOUNTER — Telehealth: Payer: Self-pay | Admitting: Family Medicine

## 2022-04-26 NOTE — Telephone Encounter (Signed)
Anna Mckinney called and she had to go to the hospital for kidney stone, they advised she needs to go to a urologist, she is wanting your opinion but wouldn't schedule an appointment.

## 2022-04-28 ENCOUNTER — Ambulatory Visit (INDEPENDENT_AMBULATORY_CARE_PROVIDER_SITE_OTHER): Payer: PPO | Admitting: Medical

## 2022-04-28 VITALS — BP 140/70 | HR 57 | Temp 98.8°F | Resp 18 | Wt 150.0 lb

## 2022-04-28 DIAGNOSIS — N134 Hydroureter: Secondary | ICD-10-CM | POA: Diagnosis not present

## 2022-04-28 DIAGNOSIS — N200 Calculus of kidney: Secondary | ICD-10-CM

## 2022-04-28 DIAGNOSIS — M7989 Other specified soft tissue disorders: Secondary | ICD-10-CM

## 2022-04-28 DIAGNOSIS — N1330 Unspecified hydronephrosis: Secondary | ICD-10-CM | POA: Diagnosis not present

## 2022-04-28 LAB — BASIC METABOLIC PANEL
BUN/Creatinine Ratio: 21 (ref 12–28)
BUN: 14 mg/dL (ref 8–27)
CO2: 25 mmol/L (ref 20–29)
Calcium: 10.3 mg/dL (ref 8.7–10.3)
Chloride: 104 mmol/L (ref 96–106)
Creatinine, Ser: 0.66 mg/dL (ref 0.57–1.00)
Glucose: 98 mg/dL (ref 70–99)
Potassium: 3.8 mmol/L (ref 3.5–5.2)
Sodium: 138 mmol/L (ref 134–144)
eGFR: 94 mL/min/{1.73_m2} (ref >59)

## 2022-04-28 NOTE — Progress Notes (Signed)
Subjective:  Anna Mckinney is a 72 y.o. female who presents for Chief Complaint  Patient presents with   Facial Swelling    Complains of facial swelling and leg swelling x 3 days. Associated symptoms includes leg pain and redness. Seen in the ER on 04/24/2022 for kidney stones. Was prescribed new medications.      Here for hospital follow-up.  She went to emergency department on 04/24/2022 for kidney stone.  She had a lot of pain that day in the next few days but the pain today is not so bad.  She has not had to use Tylenol or anything today for pain.  She was given morphine when she used the first 2 days but she felt like she was having a reaction to that as she got some swelling in the face and legs but the leg swelling has continued.  She is taking tamsulosin.  She has not yet passed the stone.  She had stones in both kidneys.  She had some blood in the urine the first couple days but not in the last day or 2  She notes leg swelling x 4 days.  No leg pain but the legs do feel tight.  No fever body aches or chills.  No chest pain or palpitations, no tongue swelling, no rash.  No other aggravating or relieving factors.    No other c/o.  Past Medical History:  Diagnosis Date   Barrett's esophagus     [place] (Dr. Ferdinand Lango)   Blood transfusion without reported diagnosis    Cataract    bilateral-removed   Gastritis    GERD (gastroesophageal reflux disease)    Hemorrhoid    Hyperplastic colon polyp 11/23/2010   Dr Virgel Bouquet   Insomnia    Internal prolapsed hemorrhoids s/p THD hemorrhoidal ligation/pexy 10/16/2012   Osteopenia    PONV (postoperative nausea and vomiting)    PT'S HEART RATE DOWN TO 40 WITH HER LAST SURGERY - SHE REMEMBERS BEING ASKED TO WAKE UP SO HEART RATE WOULD GO UP - SCARED HER.   Rectal prolapse    Sleep apnea 2015   mild, pt denies 01/02/19   Ulcer of esophagus 10/08/2009   Dr Virgel Bouquet   Current Outpatient Medications on File Prior to Visit   Medication Sig Dispense Refill   albuterol (VENTOLIN) 2 MG/5ML syrup Take 7.5 mLs (3 mg total) by mouth 3 (three) times daily. prn 120 mL 1   Ascorbic Acid (VITAMIN C ER PO) Take by mouth.     azithromycin (ZITHROMAX) 250 MG tablet 2 tablets day 1, then 1 tablet days 2-4 6 tablet 0   azithromycin (ZITHROMAX) 250 MG tablet 2 tablets day 1, then 1 tablet days 2-4 6 tablet 0   cefpodoxime (VANTIN) 100 MG tablet Take 1 tablet (100 mg total) by mouth 2 (two) times daily for 10 days. 20 tablet 0   celecoxib (CELEBREX) 200 MG capsule Take 1 capsule (200 mg total) by mouth 2 (two) times daily. 20 capsule 0   Cholecalciferol (VITAMIN D3) 5000 UNITS TABS Take 1 tablet by mouth daily.     diclofenac (VOLTAREN) 75 MG EC tablet Take 1 tablet (75 mg total) by mouth 2 (two) times daily. 30 tablet 0   esomeprazole (NEXIUM) 40 MG capsule Take 1 capsule (40 mg total) by mouth as needed. 90 capsule 3   meclizine (ANTIVERT) 12.5 MG tablet Take 1 tablet (12.5 mg total) by mouth 3 (three) times daily as needed for dizziness.  20 tablet 0   methocarbamol (ROBAXIN) 500 MG tablet Take 1 tablet (500 mg total) by mouth every 8 (eight) hours as needed for muscle spasms. 20 tablet 0   Misc Natural Products (ELDERBERRY ZINC/VIT C/IMMUNE MT) Use as directed in the mouth or throat.     morphine (MSIR) 15 MG tablet Take 0.5 tablets (7.5 mg total) by mouth every 4 (four) hours as needed for severe pain. 5 tablet 0   ondansetron (ZOFRAN-ODT) 4 MG disintegrating tablet 29m ODT q4 hours prn nausea/vomit 20 tablet 0   oseltamivir (TAMIFLU) 75 MG capsule Take 1 capsule (75 mg total) by mouth 2 (two) times daily. 10 capsule 0   Probiotic Product (PRO-BIOTIC BLEND PO) Take by mouth.     promethazine-dextromethorphan (PROMETHAZINE-DM) 6.25-15 MG/5ML syrup Take 5 mLs by mouth 4 (four) times daily as needed for cough. 120 mL 0   promethazine-dextromethorphan (PROMETHAZINE-DM) 6.25-15 MG/5ML syrup Take 5 mLs by mouth 4 (four) times daily as  needed for cough. 120 mL 0   Specialty Vitamins Products ([facility] CAPS Take by mouth.     tamsulosin (FLOMAX) 0.4 MG CAPS capsule Take 1 capsule (0.4 mg total) by mouth daily after supper. 30 capsule 0   UPNEEQ 0.1 % SOLN      zolpidem (AMBIEN) 5 MG tablet Take 1 tablet (5 mg total) by mouth at bedtime. 90 tablet 1   albuterol (VENTOLIN HFA) 108 (90 Base) MCG/ACT inhaler Inhale 2 puffs into the lungs every 6 (six) hours as needed for wheezing or shortness of breath. (Patient not taking: Reported on 04/28/2022) 8 g 0   benzonatate (TESSALON) 200 MG capsule Take 1 capsule (200 mg total) by mouth 3 (three) times daily as needed for cough. (Patient not taking: Reported on 04/28/2022) 30 capsule 0   No current facility-administered medications on file prior to visit.     The following portions of the patient's history were reviewed and updated as appropriate: allergies, current medications, past family history, past medical history, past social history, past surgical history and problem list.  ROS Otherwise as in subjective above  Objective: BP (!) 140/70 (BP Location: Right Arm, Cuff Size: Normal)   Pulse (!) 57   Temp 98.8 F (37.1 C) (Tympanic)   Resp 18   Wt 150 lb (68 kg)   SpO2 97% Comment: room air  BMI 25.75 kg/m   General appearance: alert, no distress, well developed, well nourished There is slight subtle puffiness of both cheeks but no other obvious facial swelling or angioedema HEENT: normocephalic, sclerae anicteric, conjunctiva pink and moist, TMs pearly, nares patent, no discharge or erythema, pharynx normal Oral cavity: MMM, no lesions Neck: supple, no lymphadenopathy, no thyromegaly, no masses Heart: RRR, normal S1, S2, no murmurs Lungs: CTA bilaterally, no wheezes, rhonchi, or rales Abdomen: +bs, soft, non tender, non distended, no masses, no hepatomegaly, no splenomegaly Back nontender Pulses: 2+ radial pulses, 2+ pedal pulses, normal cap refill Ext: Bilateral  lower legs with 1+ nonpitting edema, there is some tightness to the legs but nontender   Assessment: Encounter Diagnoses  Name Primary?   Hydroureter Yes   Hydronephrosis, unspecified hydronephrosis type    Renal stone    Leg swelling      Plan: We discussed her symptoms and concerns.  I reviewed the emergency department notes and CT scan.  We will check a BMP today STAT.  We discussed possible causes of the leg swelling.  Her urine output is pretty good, she has not had  prolonged sedentary activity last few days, no cardiac symptoms.      I called and spoke to [facility] urology about her case and symptoms and scan findings.  They will work her in for a visit tomorrow at 2 PM with Daine Gravel, NP.    She may have had some swelling with the morphine as she does not tolerate other narcotic pain medications.  There is very subtle swelling in the face at this point but the leg swelling is her main concern today.  Continue to hydrate throughout the day to avoid dehydration.  If urine output significantly decreases or if pain is significantly worse overnight, could go to the emergency department.  Otherwise follow-up with urology tomorrow   Preslyn was seen today for facial swelling.  Diagnoses and all orders for this visit:  Hydroureter -     Cancel: Basic metabolic panel -     Basic metabolic panel  Hydronephrosis, unspecified hydronephrosis type -     Cancel: Basic metabolic panel -     Basic metabolic panel  Renal stone -     Cancel: Basic metabolic panel -     Basic metabolic panel  Leg swelling    Follow up: Tomorrow with urology

## 2022-04-28 NOTE — Progress Notes (Signed)
Thankfully the electrolytes and kidney was normal.  Follow-up tomorrow with urology as planned

## 2022-04-29 DIAGNOSIS — N132 Hydronephrosis with renal and ureteral calculous obstruction: Secondary | ICD-10-CM | POA: Diagnosis not present

## 2022-05-01 ENCOUNTER — Other Ambulatory Visit: Payer: Self-pay | Admitting: Urology

## 2022-05-05 ENCOUNTER — Telehealth: Payer: PPO | Admitting: Family Medicine

## 2022-05-06 ENCOUNTER — Other Ambulatory Visit: Payer: Self-pay | Admitting: Urology

## 2022-05-06 NOTE — Progress Notes (Signed)
Please place orders for PAT appointment scheduled 05/07/22.

## 2022-05-06 NOTE — Patient Instructions (Addendum)
SURGICAL WAITING ROOM VISITATION  Patients having surgery or a procedure may have no more than 2 support people in the waiting area - these visitors may rotate.    Children under the age of 72 must have an adult with them who is not the patient.  Due to an increase in RSV and influenza rates and associated hospitalizations, children ages 2 and under may not visit patients in [place].  If the patient needs to stay at the hospital during part of their recovery, the visitor guidelines for inpatient rooms apply. Pre-op nurse will coordinate an appropriate time for 1 support person to accompany patient in pre-op.  This support person may not rotate.    Please refer to the [facility] website for the visitor guidelines for Inpatients (after your surgery is over and you are in a regular room).    Your procedure is scheduled on: 05/11/22   Report to [facility] Main Entrance    Report to admitting at 8:00 AM   Call this number if you have problems the morning of surgery [phone]   Do not eat food or drink liquids :After Midnight.          If you have questions, please contact your surgeon's office.   FOLLOW BOWEL PREP AND ANY ADDITIONAL PRE OP INSTRUCTIONS YOU RECEIVED FROM YOUR SURGEON'S OFFICE!!!     Oral Hygiene is also important to reduce your risk of infection.                                    Remember - BRUSH YOUR TEETH THE MORNING OF SURGERY WITH YOUR REGULAR TOOTHPASTE  DENTURES WILL BE REMOVED PRIOR TO SURGERY PLEASE DO NOT APPLY "Poly grip" OR ADHESIVES!!!   Take these medicines the morning of surgery with A SIP OF WATER: Inhalers, Meclizine, Zofran                              You may not have any metal on your body including hair pins, jewelry, and body piercing             Do not wear make-up, lotions, powders, perfumes, or deodorant  Do not wear nail polish including gel and S&S, artificial/acrylic nails, or any other type of covering on  natural nails including finger and toenails. If you have artificial nails, gel coating, etc. that needs to be removed by a nail salon please have this removed prior to surgery or surgery may need to be canceled/ delayed if the surgeon/ anesthesia feels like they are unable to be safely monitored.   Do not shave  48 hours prior to surgery.    Do not bring valuables to the hospital. [place].   Contacts, glasses, dentures or bridgework may not be worn into surgery.  DO NOT [place]. PHARMACY WILL DISPENSE MEDICATIONS LISTED ON YOUR MEDICATION LIST TO YOU DURING YOUR ADMISSION [place]!    Patients discharged on the day of surgery will not be allowed to drive home.  Someone NEEDS to stay with you for the first 24 hours after anesthesia.              Please read over the following fact sheets you  were given: IF YOU HAVE QUESTIONS ABOUT YOUR PRE-OP INSTRUCTIONS PLEASE CALL [name]    If you received a COVID test during your pre-op visit  it is requested that you wear a mask when out in public, stay away from anyone that may not be feeling well and notify your surgeon if you develop symptoms. If you test positive for Covid or have been in contact with anyone that has tested positive in the last 10 days please notify you surgeon.    [place] - Preparing for Surgery Before surgery, you can play an important role.  Because skin is not sterile, your skin needs to be as free of germs as possible.  You can reduce the number of germs on your skin by washing with CHG (chlorahexidine gluconate) soap before surgery.  CHG is an antiseptic cleaner which kills germs and bonds with the skin to continue killing germs even after washing. Please DO NOT use if you have an allergy to CHG or antibacterial soaps.  If your skin becomes reddened/irritated stop using the CHG and inform your nurse when you arrive at  Short Stay. Do not shave (including legs and underarms) for at least 48 hours prior to the first CHG shower.  You may shave your face/neck.  Please follow these instructions carefully:  1.  Shower with CHG Soap the night before surgery and the  morning of surgery.  2.  If you choose to wash your hair, wash your hair first as usual with your normal  shampoo.  3.  After you shampoo, rinse your hair and body thoroughly to remove the shampoo.                             4.  Use CHG as you would any other liquid soap.  You can apply chg directly to the skin and wash.  Gently with a scrungie or clean washcloth.  5.  Apply the CHG Soap to your body ONLY FROM THE NECK DOWN.   Do   not use on face/ open                           Wound or open sores. Avoid contact with eyes, ears mouth and   genitals (private parts).                       Wash face,  Genitals (private parts) with your normal soap.             6.  Wash thoroughly, paying special attention to the area where your    surgery  will be performed.  7.  Thoroughly rinse your body with warm water from the neck down.  8.  DO NOT shower/wash with your normal soap after using and rinsing off the CHG Soap.                9.  Pat yourself dry with a clean towel.            10.  Wear clean pajamas.            11.  Place clean sheets on your bed the night of your first shower and do not  sleep with pets. Day of Surgery : Do not apply any lotions/deodorants the morning of surgery.  Please wear clean clothes to the hospital/surgery center.  FAILURE TO FOLLOW THESE INSTRUCTIONS MAY  RESULT IN THE CANCELLATION OF YOUR SURGERY  PATIENT SIGNATURE_________________________________  NURSE SIGNATURE__________________________________  ________________________________________________________________________

## 2022-05-06 NOTE — Progress Notes (Addendum)
COVID Vaccine Completed: yes  Date of COVID positive in last 90 days: no  PCP - Jill Alexanders, MD Cardiologist - n/a  Chest x-ray - n/a EKG - 04/27/22 Epic Stress Test - n/a ECHO - n/a Cardiac Cath - n/a Pacemaker/ICD device last checked: n/a Spinal Cord Stimulator: n/a  Bowel Prep - no  Sleep Study - yes, negative per pt CPAP -   Fasting Blood Sugar - n/a Checks Blood Sugar _____ times a day  Last dose of GLP1 agonist-  N/A GLP1 instructions:  N/A   Last dose of SGLT-2 inhibitors-  N/A SGLT-2 instructions: N/A   Blood Thinner Instructions: n/a Aspirin Instructions: Last Dose:  Activity level: Can go up a flight of stairs and perform activities of daily living without stopping and without symptoms of chest pain or shortness of breath.   Anesthesia review: HR dropped during prolapse surgery  Patient denies shortness of breath, fever, cough and chest pain at PAT appointment  Patient verbalized understanding of instructions that were given to them at the PAT appointment. Patient was also instructed that they will need to review over the PAT instructions again at home before surgery.

## 2022-05-07 ENCOUNTER — Encounter (HOSPITAL_COMMUNITY)
Admission: RE | Admit: 2022-05-07 | Discharge: 2022-05-07 | Disposition: A | Discharge status: Home / Self Care | Payer: PPO | Source: Ambulatory Visit | Attending: Urology | Admitting: Urology

## 2022-05-07 ENCOUNTER — Encounter (HOSPITAL_COMMUNITY): Payer: Self-pay

## 2022-05-07 VITALS — BP 155/69 | HR 57 | Temp 97.7°F | Resp 16 | Ht 64.0 in | Wt 146.0 lb

## 2022-05-07 DIAGNOSIS — Z01818 Encounter for other preprocedural examination: Secondary | ICD-10-CM | POA: Insufficient documentation

## 2022-05-07 HISTORY — DX: Personal history of other diseases of the digestive system: Z87.19

## 2022-05-07 LAB — CBC
HCT: 43.1 % (ref 36.0–46.0)
Hemoglobin: 14.6 g/dL (ref 12.0–15.0)
MCH: 30.2 pg (ref 26.0–34.0)
MCHC: 33.9 g/dL (ref 30.0–36.0)
MCV: 89 fL (ref 80.0–100.0)
Platelets: 223 10*3/uL (ref 150–400)
RBC: 4.84 MIL/uL (ref 3.87–5.11)
RDW: 13.2 % (ref 11.5–15.5)
WBC: 5.5 10*3/uL (ref 4.0–10.5)
nRBC: 0 % (ref 0.0–0.2)

## 2022-05-07 NOTE — H&P (View-Only) (Signed)
CC/HPI: cc: Left hydronephrosis secondary to calculus   HPI: [name] is a 72-year-old female who has a past medical history of urolithiasis. She reports that she passed a stone 1 time a few years ago. She was seen in the ER on 03/24/2022 due to concerns of left flank pain. A CT scan was ordered which showed a 4 to 5 mm left ureteral stone causing mild to moderate hydronephrosis. She followed up with her PCP yesterday due to concerns of bilateral leg swelling and vaginal pain and discomfort associated with gross hematuria. Her renal function was drawn and her creatinine was completely normal at 0.66. She presents today for evaluation. She has not seen a stone pass. She reports frequency and dribbling with urination. She has some slight left-sided flank discomfort but describes it as mild and not severe. 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     ALLERGIES: Codeine    MEDICATIONS: Ambien     GU PSH: None   NON-GU PSH: Hysterectomy Remove Gallbladder     GU PMH: None     PMH Notes: HTX of cacncer   NON-GU PMH: Acute gastric ulcer with hemorrhage GERD    FAMILY HISTORY: No Family History    SOCIAL HISTORY: Marital Status: Married Preferred Language: English; Ethnicity: Not Hispanic Or Latino; Race: White Current Smoking Status: Patient does not smoke anymore. Has not smoked since 04/08/2002.   Tobacco Use Assessment Completed: Used Tobacco in last 30 days? Does not use smokeless tobacco. Has never drank.  Drinks 2 caffeinated drinks per day.    REVIEW OF SYSTEMS:    GU Review Female:   Patient reports frequent urination, hard to postpone urination, and get up at night to urinate. Patient denies burning /pain with urination, leakage of urine, stream starts and stops, trouble starting your stream, have to strain to urinate, and being pregnant.  Gastrointestinal (Upper):   Patient denies nausea, vomiting, and indigestion/ heartburn.  Gastrointestinal  (Lower):   Patient denies diarrhea and constipation.  Constitutional:   Patient denies fatigue, fever, weight loss, and night sweats.  Skin:   Patient denies skin rash/ lesion and itching.  Eyes:   Patient denies blurred vision and double vision.  Ears/ Nose/ Throat:   Patient denies sore throat and sinus problems.  Hematologic/Lymphatic:   Patient reports easy bruising. Patient denies swollen glands.  Cardiovascular:   Patient reports leg swelling. Patient denies chest pains.  Respiratory:   Patient denies cough and shortness of breath.  Endocrine:   Patient denies excessive thirst.  Musculoskeletal:   Patient denies back pain and joint pain.  Neurological:   Patient denies headaches and dizziness.  Psychologic:   Patient denies depression and anxiety.   VITAL SIGNS:      04/29/2022 01:52 PM  Weight 143 lb / 64.86 kg  Height 63 in / 160.02 cm  Temperature 97.2 F / 36.2 C  BMI 25.3 kg/m   MULTI-SYSTEM PHYSICAL EXAMINATION:    Constitutional: Well-nourished. No physical deformities. Normally developed. Good grooming.  Respiratory: No labored breathing, no use of accessory muscles.   Cardiovascular: Normal temperature, normal extremity pulses, no swelling, no varicosities.  Skin: No paleness, no jaundice, no cyanosis. No lesion, no ulcer, no rash.  Neurologic / Psychiatric: Oriented to time, oriented to place, oriented to person. No depression, no anxiety, no agitation.  Gastrointestinal: No mass, no tenderness, no rigidity, non obese abdomen.     Complexity of Data:  Source Of History:    Patient  Records Review:   Previous Hospital Records, Previous Patient Records  Urine Test Review:   Urinalysis  X-Ray Review: KUB: Reviewed Films. Reviewed Report. Discussed With Patient.  C.T. Abdomen/Pelvis: Reviewed Films. Reviewed Report. CLINICAL DATA: Left flank pain   EXAM:  CT ABDOMEN AND PELVIS WITHOUT CONTRAST   TECHNIQUE:  Multidetector CT imaging of the abdomen and pelvis was  performed  following the standard protocol without IV contrast.   RADIATION DOSE REDUCTION: This exam was performed according to the  departmental dose-optimization program which includes automated  exposure control, adjustment of the mA and/or kV according to  patient size and/or use of iterative reconstruction technique.   COMPARISON: 11/03/2015   FINDINGS:  Lower chest: Clear. No pleural effusion or pericardial effusion.  Small hiatal hernia.   Hepatobiliary: Scattered subcentimeter hypodensities are too small  to be characterize by CT. Status post cholecystectomy. No biliary  dilatation.   Pancreas: Unremarkable. No pancreatic ductal dilatation or  surrounding inflammatory changes.   Spleen: Normal in size without focal abnormality.   Adrenals/Urinary Tract: No adrenal lesions. On the right there is no  hydronephrosis. There is a lower pole 4 mm right-sided renal stone.  On the left there is hydronephrosis and hydroureter with a mid left  ureteral 5 mm stone. There is an intrarenal stone measuring about a  mm on the left. Urinary bladder was nearly empty and otherwise  unremarkable.   Stomach/Bowel: Stomach is within normal limits. Appendix appears  normal. No evidence of bowel wall thickening, distention, or  inflammatory changes. There is sigmoid diverticulosis with no  evidence of diverticulitis   Vascular/Lymphatic: Aortic atherosclerosis. No enlarged abdominal or  pelvic lymph nodes.   Reproductive: Status post hysterectomy. No adnexal masses.   Other: No abdominal wall hernia or abnormality. No abdominopelvic  ascites.   Musculoskeletal: Thoracolumbosacral degenerative changes are noted.   IMPRESSION:  1. Bilateral nephrolithiasis.  2. Left-sided hydronephrosis with a mid left ureteral stone.  3. Small hiatal hernia.  4. Diverticulosis.      04/29/22  Urinalysis  Urine Appearance Slightly Cloudy   Urine Color Yellow   Urine Glucose Neg mg/dL  Urine  Bilirubin Neg mg/dL  Urine Ketones Neg mg/dL  Urine Specific Gravity 1.025   Urine Blood Neg ery/uL  Urine pH <=5.0   Urine Protein Neg mg/dL  Urine Urobilinogen 0.2 mg/dL  Urine Nitrites Neg   Urine Leukocyte Esterase Neg leu/uL  Urine WBC/hpf 0 - 5/hpf   Urine RBC/hpf NS (Not Seen)   Urine Epithelial Cells 0 - 5/hpf   Urine Bacteria NS (Not Seen)   Urine Mucous Not Present   Urine Yeast NS (Not Seen)   Urine Trichomonas Not Present   Urine Cystals NS (Not Seen)   Urine Casts NS (Not Seen)   Urine Sperm Not Present    PROCEDURES:         KUB - [id number]  A single view of the abdomen is obtained. Bilateral renal shadows are visualized and appear clear. There are no appreciable opacities along either ureter. There is a few small phleboliths noted within the pelvic inlet.      . Patient confirmed No Neulasta OnPro Device.          PVR Ultrasound - [id number]  Scanned Volume: 0 cc         Urinalysis w/Scope Dipstick Dipstick Cont'd Micro  Color: Yellow Bilirubin: Neg mg/dL WBC/hpf: 0 - 5/hpf  Appearance: Slightly Cloudy Ketones: Neg mg/dL RBC/hpf: NS (Not Seen)    Specific Gravity: 1.025 Blood: Neg ery/uL Bacteria: NS (Not Seen)  pH: <=5.0 Protein: Neg mg/dL Cystals: NS (Not Seen)  Glucose: Neg mg/dL Urobilinogen: 0.2 mg/dL Casts: NS (Not Seen)    Nitrites: Neg Trichomonas: Not Present    Leukocyte Esterase: Neg leu/uL Mucous: Not Present      Epithelial Cells: 0 - 5/hpf      Yeast: NS (Not Seen)      Sperm: Not Present    ASSESSMENT:      ICD-10 Details  1 GU:   Ureteral obstruction secondary to calculous - N13.2 Left, Acute, Uncomplicated   PLAN:           Orders X-Rays: KUB          Schedule X-Rays: 1 Week - Renal Ultrasound (Limited) - left please  Return Visit/Planned Activity: 1-2 Weeks - Office Visit, Renal Ultrasound (Limited)             Note: left          Document Letter(s):  Created for Patient: Clinical Summary         Notes:   I am unable to  appreciate her stone on KUB imaging. It was also not appreciable on scout. We discussed options including definitive stone intervention and continuing with medical expulsive therapy. For the time being, she would like to continue with the tamsulosin and increased water intake. She was given a strainer. She will strain her urine and bring her stone for analysis should she passed the stone in the next 1 to 2 weeks. Otherwise, she will follow-up in 1 to 2 weeks for a left-sided renal ultrasound and discussion of stone intervention.   

## 2022-05-07 NOTE — H&P (Signed)
CC/HPI: cc: Left hydronephrosis secondary to calculus   HPI: Anna Mckinney is a 72 year old female who has a past medical history of urolithiasis. She reports that she passed a stone 1 time a few years ago. She was seen in the ER on 03/24/2022 due to concerns of left flank pain. A CT scan was ordered which showed a 4 to 5 mm left ureteral stone causing mild to moderate hydronephrosis. She followed up with her PCP yesterday due to concerns of bilateral leg swelling and vaginal pain and discomfort associated with gross hematuria. Her renal function was drawn and her creatinine was completely normal at 0.66. She presents today for evaluation. She has not seen a stone pass. She reports frequency and dribbling with urination. She has some slight left-sided flank discomfort but describes it as mild and not severe. 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     ALLERGIES: Codeine    MEDICATIONS: Ambien     GU PSH: None   NON-GU PSH: Hysterectomy Remove Gallbladder     GU PMH: None     PMH Notes: HTX of cacncer   NON-GU PMH: Acute gastric ulcer with hemorrhage GERD    FAMILY HISTORY: No Family History    SOCIAL HISTORY: Marital Status: Married Preferred Language: English; Ethnicity: Not Hispanic Or Latino; Race: White Current Smoking Status: Patient does not smoke anymore. Has not smoked since 04/08/2002.   Tobacco Use Assessment Completed: Used Tobacco in last 30 days? Does not use smokeless tobacco. Has never drank.  Drinks 2 caffeinated drinks per day.    REVIEW OF SYSTEMS:    GU Review Female:   Patient reports frequent urination, hard to postpone urination, and get up at night to urinate. Patient denies burning /pain with urination, leakage of urine, stream starts and stops, trouble starting your stream, have to strain to urinate, and being pregnant.  Gastrointestinal (Upper):   Patient denies nausea, vomiting, and indigestion/ heartburn.  Gastrointestinal  (Lower):   Patient denies diarrhea and constipation.  Constitutional:   Patient denies fatigue, fever, weight loss, and night sweats.  Skin:   Patient denies skin rash/ lesion and itching.  Eyes:   Patient denies blurred vision and double vision.  Ears/ Nose/ Throat:   Patient denies sore throat and sinus problems.  Hematologic/Lymphatic:   Patient reports easy bruising. Patient denies swollen glands.  Cardiovascular:   Patient reports leg swelling. Patient denies chest pains.  Respiratory:   Patient denies cough and shortness of breath.  Endocrine:   Patient denies excessive thirst.  Musculoskeletal:   Patient denies back pain and joint pain.  Neurological:   Patient denies headaches and dizziness.  Psychologic:   Patient denies depression and anxiety.   VITAL SIGNS:      04/29/2022 01:52 PM  Weight 143 lb / 64.86 kg  Height 63 in / 160.02 cm  Temperature 97.2 F / 36.2 C  BMI 25.3 kg/m   MULTI-SYSTEM PHYSICAL EXAMINATION:    Constitutional: Well-nourished. No physical deformities. Normally developed. Good grooming.  Respiratory: No labored breathing, no use of accessory muscles.   Cardiovascular: Normal temperature, normal extremity pulses, no swelling, no varicosities.  Skin: No paleness, no jaundice, no cyanosis. No lesion, no ulcer, no rash.  Neurologic / Psychiatric: Oriented to time, oriented to place, oriented to person. No depression, no anxiety, no agitation.  Gastrointestinal: No mass, no tenderness, no rigidity, non obese abdomen.     Complexity of Data:  Source Of History:  Patient  Records Review:   Previous Hospital Records, Previous Patient Records  Urine Test Review:   Urinalysis  X-Ray Review: KUB: Reviewed Films. Reviewed Report. Discussed With Patient.  C.T. Abdomen/Pelvis: Reviewed Films. Reviewed Report. CLINICAL DATA: Left flank pain   EXAM:  CT ABDOMEN AND PELVIS WITHOUT CONTRAST   TECHNIQUE:  Multidetector CT imaging of the abdomen and pelvis was  performed  following the standard protocol without IV contrast.   RADIATION DOSE REDUCTION: This exam was performed according to the  departmental dose-optimization program which includes automated  exposure control, adjustment of the mA and/or kV according to  patient size and/or use of iterative reconstruction technique.   COMPARISON: 11/03/2015   FINDINGS:  Lower chest: Clear. No pleural effusion or pericardial effusion.  Small hiatal hernia.   Hepatobiliary: Scattered subcentimeter hypodensities are too small  to be characterize by CT. Status post cholecystectomy. No biliary  dilatation.   Pancreas: Unremarkable. No pancreatic ductal dilatation or  surrounding inflammatory changes.   Spleen: Normal in size without focal abnormality.   Adrenals/Urinary Tract: No adrenal lesions. On the right there is no  hydronephrosis. There is a lower pole 4 mm right-sided renal stone.  On the left there is hydronephrosis and hydroureter with a mid left  ureteral 5 mm stone. There is an intrarenal stone measuring about a  mm on the left. Urinary bladder was nearly empty and otherwise  unremarkable.   Stomach/Bowel: Stomach is within normal limits. Appendix appears  normal. No evidence of bowel wall thickening, distention, or  inflammatory changes. There is sigmoid diverticulosis with no  evidence of diverticulitis   Vascular/Lymphatic: Aortic atherosclerosis. No enlarged abdominal or  pelvic lymph nodes.   Reproductive: Status post hysterectomy. No adnexal masses.   Other: No abdominal wall hernia or abnormality. No abdominopelvic  ascites.   Musculoskeletal: Thoracolumbosacral degenerative changes are noted.   IMPRESSION:  1. Bilateral nephrolithiasis.  2. Left-sided hydronephrosis with a mid left ureteral stone.  3. Small hiatal hernia.  4. Diverticulosis.      04/29/22  Urinalysis  Urine Appearance Slightly Cloudy   Urine Color Yellow   Urine Glucose Neg mg/dL  Urine  Bilirubin Neg mg/dL  Urine Ketones Neg mg/dL  Urine Specific Gravity 1.025   Urine Blood Neg ery/uL  Urine pH <=5.0   Urine Protein Neg mg/dL  Urine Urobilinogen 0.2 mg/dL  Urine Nitrites Neg   Urine Leukocyte Esterase Neg leu/uL  Urine WBC/hpf 0 - 5/hpf   Urine RBC/hpf NS (Not Seen)   Urine Epithelial Cells 0 - 5/hpf   Urine Bacteria NS (Not Seen)   Urine Mucous Not Present   Urine Yeast NS (Not Seen)   Urine Trichomonas Not Present   Urine Cystals NS (Not Seen)   Urine Casts NS (Not Seen)   Urine Sperm Not Present    PROCEDURES:         KUB [id number]  A single view of the abdomen is obtained. Bilateral renal shadows are visualized and appear clear. There are no appreciable opacities along either ureter. There is a few small phleboliths noted within the pelvic inlet.      . Patient confirmed No Neulasta OnPro Device.          PVR Ultrasound - [id number]  Scanned Volume: 0 cc         Urinalysis w/Scope Dipstick Dipstick Cont'd Micro  Color: Yellow Bilirubin: Neg mg/dL WBC/hpf: 0 - 5/hpf  Appearance: Slightly Cloudy Ketones: Neg mg/dL RBC/hpf: NS (Not Seen)  Specific Gravity: 1.025 Blood: Neg ery/uL Bacteria: NS (Not Seen)  pH: <=5.0 Protein: Neg mg/dL Cystals: NS (Not Seen)  Glucose: Neg mg/dL Urobilinogen: 0.2 mg/dL Casts: NS (Not Seen)    Nitrites: Neg Trichomonas: Not Present    Leukocyte Esterase: Neg leu/uL Mucous: Not Present      Epithelial Cells: 0 - 5/hpf      Yeast: NS (Not Seen)      Sperm: Not Present    ASSESSMENT:      ICD-10 Details  1 GU:   Ureteral obstruction secondary to calculous - N13.2 Left, Acute, Uncomplicated   PLAN:           Orders X-Rays: KUB          Schedule X-Rays: 1 Week - Renal Ultrasound (Limited) - left please  Return Visit/Planned Activity: 1-2 Weeks - Office Visit, Renal Ultrasound (Limited)             Note: left          Document Letter(s):  Created for Patient: Clinical Summary         Notes:   I am unable to  appreciate her stone on KUB imaging. It was also not appreciable on scout. We discussed options including definitive stone intervention and continuing with medical expulsive therapy. For the time being, she would like to continue with the tamsulosin and increased water intake. She was given a strainer. She will strain her urine and bring her stone for analysis should she passed the stone in the next 1 to 2 weeks. Otherwise, she will follow-up in 1 to 2 weeks for a left-sided renal ultrasound and discussion of stone intervention.

## 2022-05-11 ENCOUNTER — Ambulatory Visit (HOSPITAL_BASED_OUTPATIENT_CLINIC_OR_DEPARTMENT_OTHER): Payer: PPO | Admitting: Certified Registered Nurse Anesthetist

## 2022-05-11 ENCOUNTER — Encounter (HOSPITAL_COMMUNITY)
Admission: RE | Disposition: A | Discharge status: Home / Self Care | Payer: Self-pay | Source: Home / Self Care | Attending: Urology

## 2022-05-11 ENCOUNTER — Ambulatory Visit (HOSPITAL_COMMUNITY)
Admission: RE | Admit: 2022-05-11 | Discharge: 2022-05-11 | Disposition: A | Discharge status: Home / Self Care | Payer: PPO | Attending: Urology | Admitting: Urology

## 2022-05-11 ENCOUNTER — Ambulatory Visit (HOSPITAL_COMMUNITY): Payer: PPO | Admitting: Certified Registered Nurse Anesthetist

## 2022-05-11 ENCOUNTER — Encounter (HOSPITAL_COMMUNITY): Payer: Self-pay | Admitting: Urology

## 2022-05-11 ENCOUNTER — Ambulatory Visit (HOSPITAL_COMMUNITY): Payer: PPO

## 2022-05-11 DIAGNOSIS — K449 Diaphragmatic hernia without obstruction or gangrene: Secondary | ICD-10-CM

## 2022-05-11 DIAGNOSIS — Z87891 Personal history of nicotine dependence: Secondary | ICD-10-CM | POA: Insufficient documentation

## 2022-05-11 DIAGNOSIS — G4730 Sleep apnea, unspecified: Secondary | ICD-10-CM

## 2022-05-11 DIAGNOSIS — K219 Gastro-esophageal reflux disease without esophagitis: Secondary | ICD-10-CM | POA: Insufficient documentation

## 2022-05-11 DIAGNOSIS — Z8719 Personal history of other diseases of the digestive system: Secondary | ICD-10-CM | POA: Insufficient documentation

## 2022-05-11 DIAGNOSIS — N132 Hydronephrosis with renal and ureteral calculous obstruction: Secondary | ICD-10-CM | POA: Diagnosis not present

## 2022-05-11 DIAGNOSIS — Z8711 Personal history of peptic ulcer disease: Secondary | ICD-10-CM | POA: Diagnosis not present

## 2022-05-11 DIAGNOSIS — N201 Calculus of ureter: Secondary | ICD-10-CM | POA: Diagnosis not present

## 2022-05-11 DIAGNOSIS — Z01818 Encounter for other preprocedural examination: Secondary | ICD-10-CM

## 2022-05-11 HISTORY — PX: CYSTOSCOPY/URETEROSCOPY/HOLMIUM LASER/STENT PLACEMENT: SHX6546

## 2022-05-11 SURGERY — CYSTOSCOPY/URETEROSCOPY/HOLMIUM LASER/STENT PLACEMENT
Anesthesia: General | Site: Urethra | Laterality: Left

## 2022-05-11 MED ORDER — ONDANSETRON HCL 4 MG/2ML IJ SOLN
INTRAMUSCULAR | Status: AC
  Filled 2022-05-11: qty 2

## 2022-05-11 MED ORDER — LIDOCAINE 2% (20 MG/ML) 5 ML SYRINGE
INTRAMUSCULAR | Status: DC | PRN
  Administered 2022-05-11: 100 mg via INTRAVENOUS

## 2022-05-11 MED ORDER — PROPOFOL 10 MG/ML IV BOLUS
INTRAVENOUS | Status: DC | PRN
  Administered 2022-05-11: 200 mg via INTRAVENOUS

## 2022-05-11 MED ORDER — ORAL CARE MOUTH RINSE: 15.0000 mL | Freq: Once | OROMUCOSAL | Status: AC

## 2022-05-11 MED ORDER — FENTANYL CITRATE PF 50 MCG/ML IJ SOSY: 25.0000 ug | PREFILLED_SYRINGE | INTRAMUSCULAR | Status: DC | PRN

## 2022-05-11 MED ORDER — ONDANSETRON HCL 4 MG/2ML IJ SOLN
INTRAMUSCULAR | Status: DC | PRN
  Administered 2022-05-11: 4 mg via INTRAVENOUS

## 2022-05-11 MED ORDER — IOHEXOL 300 MG/ML  SOLN
INTRAMUSCULAR | Status: DC | PRN
  Administered 2022-05-11: 18 mL via URETHRAL

## 2022-05-11 MED ORDER — DEXAMETHASONE SODIUM PHOSPHATE 10 MG/ML IJ SOLN
INTRAMUSCULAR | Status: AC
  Filled 2022-05-11: qty 1

## 2022-05-11 MED ORDER — FENTANYL CITRATE (PF) 100 MCG/2ML IJ SOLN
INTRAMUSCULAR | Status: AC
  Filled 2022-05-11: qty 2

## 2022-05-11 MED ORDER — ACETAMINOPHEN 325 MG PO TABS: 325.0000 mg | ORAL_TABLET | ORAL | Status: DC | PRN

## 2022-05-11 MED ORDER — OXYCODONE HCL 5 MG/5ML PO SOLN: 5.0000 mg | Freq: Once | ORAL | Status: DC | PRN

## 2022-05-11 MED ORDER — PROPOFOL 500 MG/50ML IV EMUL
INTRAVENOUS | Status: DC | PRN
  Administered 2022-05-11: 175 ug/kg/min via INTRAVENOUS

## 2022-05-11 MED ORDER — CEFAZOLIN SODIUM-DEXTROSE 2-4 GM/100ML-% IV SOLN
2.0000 g | INTRAVENOUS | Status: AC
  Administered 2022-05-11: 2 g via INTRAVENOUS
  Filled 2022-05-11: qty 100

## 2022-05-11 MED ORDER — MEPERIDINE HCL 50 MG/ML IJ SOLN: 6.2500 mg | INTRAMUSCULAR | Status: DC | PRN

## 2022-05-11 MED ORDER — FENTANYL CITRATE (PF) 100 MCG/2ML IJ SOLN
INTRAMUSCULAR | Status: DC | PRN
  Administered 2022-05-11: 50 ug via INTRAVENOUS
  Administered 2022-05-11 (×2): 25 ug via INTRAVENOUS

## 2022-05-11 MED ORDER — LACTATED RINGERS IV SOLN: INTRAVENOUS | Status: DC

## 2022-05-11 MED ORDER — DEXAMETHASONE SODIUM PHOSPHATE 10 MG/ML IJ SOLN
INTRAMUSCULAR | Status: DC | PRN
  Administered 2022-05-11: 5 mg via INTRAVENOUS

## 2022-05-11 MED ORDER — OXYCODONE HCL 5 MG PO TABS: 5.0000 mg | ORAL_TABLET | Freq: Once | ORAL | Status: DC | PRN

## 2022-05-11 MED ORDER — SODIUM CHLORIDE 0.9 % IR SOLN
Status: DC | PRN
  Administered 2022-05-11: 3000 mL

## 2022-05-11 MED ORDER — TAMSULOSIN HCL 0.4 MG PO CAPS: 0.4000 mg | ORAL_CAPSULE | Freq: Every day | ORAL | 0 refills | Status: DC

## 2022-05-11 MED ORDER — LIDOCAINE HCL (PF) 2 % IJ SOLN
INTRAMUSCULAR | Status: AC
  Filled 2022-05-11: qty 5

## 2022-05-11 MED ORDER — CHLORHEXIDINE GLUCONATE 0.12 % MT SOLN
15.0000 mL | Freq: Once | OROMUCOSAL | Status: AC
  Administered 2022-05-11: 15 mL via OROMUCOSAL

## 2022-05-11 MED ORDER — EPHEDRINE 5 MG/ML INJ
INTRAVENOUS | Status: AC
  Filled 2022-05-11: qty 10

## 2022-05-11 MED ORDER — ONDANSETRON HCL 4 MG/2ML IJ SOLN: 4.0000 mg | Freq: Once | INTRAMUSCULAR | Status: DC | PRN

## 2022-05-11 MED ORDER — EPHEDRINE SULFATE-NACL 50-0.9 MG/10ML-% IV SOSY
PREFILLED_SYRINGE | INTRAVENOUS | Status: DC | PRN
  Administered 2022-05-11: 7.5 mg via INTRAVENOUS

## 2022-05-11 MED ORDER — ACETAMINOPHEN 160 MG/5ML PO SOLN: 325.0000 mg | ORAL | Status: DC | PRN

## 2022-05-11 SURGICAL SUPPLY — 19 items
BAG URO CATCHER STRL LF (MISCELLANEOUS) ×2 IMPLANT
BASKET ZERO TIP NITINOL 2.4FR (BASKET) IMPLANT
BSKT STON RTRVL ZERO TP 2.4FR (BASKET)
CATH URETL OPEN 5X70 (CATHETERS) ×2 IMPLANT
CLOTH BEACON ORANGE TIMEOUT ST (SAFETY) ×2 IMPLANT
DRSG TEGADERM 2-3/8X2-3/4 SM (GAUZE/BANDAGES/DRESSINGS) IMPLANT
EXTRACTOR STONE 1.7FRX115CM (UROLOGICAL SUPPLIES) IMPLANT
GLOVE BIO SURGEON STRL SZ 6.5 (GLOVE) ×2 IMPLANT
GOWN STRL REUS W/ TWL LRG LVL3 (GOWN DISPOSABLE) ×2 IMPLANT
GOWN STRL REUS W/TWL LRG LVL3 (GOWN DISPOSABLE) ×1
GUIDEWIRE STR DUAL SENSOR (WIRE) ×2 IMPLANT
KIT TURNOVER KIT A (KITS) IMPLANT
MANIFOLD NEPTUNE II (INSTRUMENTS) ×2 IMPLANT
PACK CYSTO (CUSTOM PROCEDURE TRAY) ×2 IMPLANT
SHEATH NAVIGATOR HD 11/13X28 (SHEATH) IMPLANT
SHEATH NAVIGATOR HD 11/13X36 (SHEATH) IMPLANT
STENT URET 6FRX24 CONTOUR (STENTS) IMPLANT
TUBING CONNECTING 10 (TUBING) ×2 IMPLANT
TUBING UROLOGY SET (TUBING) ×2 IMPLANT

## 2022-05-11 NOTE — Anesthesia Preprocedure Evaluation (Addendum)
Anesthesia Evaluation  Patient identified by MRN, date of birth, ID band Patient awake    Reviewed: Allergy & Precautions, H&P , NPO status , Patient's Chart, lab work & pertinent test results  History of Anesthesia Complications (+) PONV and history of anesthetic complications  Airway Mallampati: I  TM Distance: >3 FB Neck ROM: Full    Dental no notable dental hx. (+) Teeth Intact, Dental Advisory Given, Caps   Pulmonary neg pulmonary ROS, sleep apnea , former smoker   Pulmonary exam normal breath sounds clear to auscultation       Cardiovascular Exercise Tolerance: Good negative cardio ROS Normal cardiovascular exam Rhythm:Regular Rate:Normal     Neuro/Psych negative neurological ROS  negative psych ROS   GI/Hepatic negative GI ROS, Neg liver ROS, hiatal hernia, PUD,GERD  ,,  Endo/Other  negative endocrine ROS    Renal/GU negative Renal ROS  negative genitourinary   Musculoskeletal negative musculoskeletal ROS (+)    Abdominal   Peds negative pediatric ROS (+)  Hematology negative hematology ROS (+)   Anesthesia Other Findings   Reproductive/Obstetrics negative OB ROS                             Anesthesia Physical Anesthesia Plan  ASA: 3  Anesthesia Plan: General   Post-op Pain Management: Minimal or no pain anticipated   Induction: Intravenous  PONV Risk Score and Plan: 4 or greater and Ondansetron, Treatment may vary due to age or medical condition and TIVA  Airway Management Planned: Oral ETT and LMA  Additional Equipment: None  Intra-op Plan:   Post-operative Plan: Extubation in OR  Informed Consent: I have reviewed the patients History and Physical, chart, labs and discussed the procedure including the risks, benefits and alternatives for the proposed anesthesia with the patient or authorized representative who has indicated his/her understanding and acceptance.        Plan Discussed with: CRNA and Anesthesiologist  Anesthesia Plan Comments: ( )       Anesthesia Quick Evaluation

## 2022-05-11 NOTE — Anesthesia Procedure Notes (Signed)
Procedure Name: LMA Insertion Date/Time: 05/11/2022 9:31 AM  Performed by: West Pugh, CRNAPre-anesthesia Checklist: Patient identified, Emergency Drugs available, Suction available, Patient being monitored and Timeout performed Patient Re-evaluated:Patient Re-evaluated prior to induction Oxygen Delivery Method: Circle system utilized Preoxygenation: Pre-oxygenation with 100% oxygen Induction Type: IV induction Ventilation: Mask ventilation without difficulty LMA: LMA inserted Number of attempts: 1 Placement Confirmation: positive ETCO2 and breath sounds checked- equal and bilateral Tube secured with: Tape Dental Injury: Teeth and Oropharynx as per pre-operative assessment

## 2022-05-11 NOTE — Anesthesia Postprocedure Evaluation (Signed)
Anesthesia Post Note  Patient: Anna Mckinney  Procedure(s) Performed: CYSTOSCOPY, LEFT URETEROSCOPY LEFT RETROGRADE PYELOGRAM, AND LEFT URETERAL STENT PLACEMENT (Left: Urethra)     Patient location during evaluation: PACU Anesthesia Type: General Level of consciousness: awake and alert Pain management: pain level controlled Vital Signs Assessment: post-procedure vital signs reviewed and stable Respiratory status: spontaneous breathing, nonlabored ventilation, respiratory function stable and patient connected to nasal cannula oxygen Cardiovascular status: blood pressure returned to baseline and stable Postop Assessment: no apparent nausea or vomiting Anesthetic complications: no   No notable events documented.  Last Vitals:  Vitals:   05/11/22 1015 05/11/22 1030  BP: 139/82 (!) 146/81  Pulse: 61 62  Resp:    Temp:    SpO2: 97% 98%    Last Pain:  Vitals:   05/11/22 1030  TempSrc:   PainSc: 0-No pain                 Moncia Annas

## 2022-05-11 NOTE — Discharge Instructions (Addendum)
Post stent placement instructions   Definitions:  Ureter: The duct that transports urine from the kidney to the bladder. Stent: A plastic hollow tube that is placed into the ureter, from the kidney to the bladder to prevent the ureter from swelling shut.  General instructions:  Despite the fact that no skin incisions were used, the area around the ureter and bladder is raw and irritated. The stent is a foreign body which can further irritate the bladder wall. This irritation is manifested by increased frequency of urination, both day and night, and by an increase in the urge to urinate. In some, the urge to urinate is present almost always. Sometimes the urge is strong enough that you may not be able to stop your self from urinating. This can often be controlled with medication but does not occur in everyone. A stent can safely be left in place for 3 months or greater.  You may see some blood in your urine while the stent is in place and a few days afterward. Do not be alarmed, even if the urine is clear for a while. Get off your feet and drink lots of fluids until clearing occurs. If you start to pass clots or don't improve, call us.  Diet:  You may return to your normal diet immediately. Because of the raw surface of your bladder, alcohol, spicy foods, foods high in acid and drinks with caffeine may cause irritation or frequency and should be used in moderation. To keep your urine flowing freely and avoid constipation, drink plenty of fluids during the day (8-10 glasses). Tip: Avoid cranberry juice because it is very acidic.  Activity:  Your physical activity doesn't need to be restricted. However, if you are very active, you may see some blood in the urine. We suggest that you reduce your activity under the circumstances until the bleeding has stopped.  Bowels:  It is important to keep your bowels regular during the postoperative period. Straining with bowel movements can cause bleeding. A  bowel movement every other day is reasonable. Use a mild laxative if needed, such as milk of magnesia 2-3 tablespoons, or 2 Dulcolax tablets. Call if you continue to have problems. If you had been taking narcotics for pain, before, during or after your surgery, you may be constipated. Take a laxative if necessary.  Medication:  You should resume your pre-surgery medications unless told not to. In addition you may be given an antibiotic to prevent or treat infection. Antibiotics are not always necessary. All medication should be taken as prescribed until the bottles are finished unless you are having an unusual reaction to one of the drugs.  AZO - over the counter and can help with burning with urination Tamsulosin can help with stent discomfort Tramadol can help with severe pain Problems you should report to [place]:  a. Fever greater than 101F. b. Heavy bleeding, or clots (see notes above about blood in urine). c. Inability to urinate. d. Drug reactions (hives, rash, nausea, vomiting, diarrhea). e. Severe burning or pain with urination that is not improving.  You will be contacted with your next surgery date.

## 2022-05-11 NOTE — Transfer of Care (Signed)
Immediate Anesthesia Transfer of Care Note  Patient: Anna Mckinney  Procedure(s) Performed: CYSTOSCOPY, LEFT URETEROSCOPY LEFT RETROGRADE PYELOGRAM, AND LEFT URETERAL STENT PLACEMENT (Left: Urethra)  Patient Location: PACU  Anesthesia Type:General  Level of Consciousness: awake and patient cooperative  Airway & Oxygen Therapy: Patient Spontanous Breathing and Patient connected to nasal cannula oxygen  Post-op Assessment: Report given to RN and Post -op Vital signs reviewed and stable  Post vital signs: Reviewed and stable  Last Vitals:  Vitals Value Taken Time  BP 119/76 05/11/22 1008  Temp    Pulse 69 05/11/22 1008  Resp    SpO2 97 % 05/11/22 1008  Vitals shown include unvalidated device data.  Last Pain:  Vitals:   05/11/22 0802  TempSrc:   PainSc: 0-No pain      Patients Stated Pain Goal: 5 ([id number])  Complications: No notable events documented.

## 2022-05-11 NOTE — Progress Notes (Signed)
Lucien returned pt's call. PT was home but had questions about her procedure. RN gave her basic information about pt's stent and told pt to call MD's office with any follow-up info. Pt was very pleasant and appreciative.

## 2022-05-11 NOTE — Op Note (Signed)
Preoperative diagnosis: left ureteral calculus  Postoperative diagnosis: left ureteral calculus  Procedure:  Cystoscopy left diagnostic ureteroscopy left 66F x 24 ureteral stent placement -no tether left retrograde pyelography with interpretation  Surgeon: Jacalyn Lefevre, MD  Anesthesia: General  Complications: None  Intraoperative findings:  Normal urethra Bilateral orthotropic ureteral orifices left retrograde pyelography demonstrated hydronephrosis to distal ureter consistent with obstructing ureteral calculus  Bladder mucosa normal without masses   EBL: Minimal  Specimens: None  Disposition of specimens: [facility] for stone analysis  Indication: Anna Mckinney is a 72 y.o.   patient with a  left ureteral stone and associated left symptoms. After reviewing the management options for treatment, the patient elected to proceed with the above surgical procedure(s). We have discussed the potential benefits and risks of the procedure, side effects of the proposed treatment, the likelihood of the patient achieving the goals of the procedure, and any potential problems that might occur during the procedure or recuperation. Informed consent has been obtained.   Description of procedure:  The patient was taken to the operating room and general anesthesia was induced.  The patient was placed in the dorsal lithotomy position, prepped and draped in the usual sterile fashion, and preoperative antibiotics were administered. A preoperative time-out was performed.   Cystourethroscopy was performed.  The patient's urethra was examined and was normal.  The bladder was then systematically examined in its entirety. There was no evidence for any bladder tumors, stones, or other mucosal pathology.    Attention then turned to the left ureteral orifice and a ureteral catheter was used to intubate the ureteral orifice.  Omnipaque contrast was injected through the ureteral  catheter and a retrograde pyelogram was performed with findings as dictated above.  A 0.38 sensor guidewire was then advanced up the left ureter into the renal pelvis under fluoroscopic guidance. The 4.5 Fr semirigid ureteroscope was then advanced into the ureter next to the guidewire.  The distal ureter was extremely narrow and a second sensor wire was placed through the ureteroscope in order to help guide the ureteroscope more proximally.  This was unsuccessful due to the narrow caliber of the ureter.  After several attempts the decision was made to remove the ureteroscope and proceed with stent placement.   The wire was then backloaded through the cystoscope and a ureteral stent was advance over the wire using Seldinger technique.  The stent was positioned appropriately under fluoroscopic and cystoscopic guidance.  The wire was then removed with an adequate stent curl noted in the renal pelvis as well as in the bladder.  The bladder was then emptied and the procedure ended.  The patient appeared to tolerate the procedure well and without complications.  The patient was able to be awakened and transferred to the recovery unit in satisfactory condition.   Disposition: No tether was left on the stent. We will plan to return to OR in 2-3 weeks.

## 2022-05-11 NOTE — Interval H&P Note (Signed)
History and Physical Interval Note:  05/11/2022 9:05 AM  Anna Mckinney  has presented today for surgery, with the diagnosis of LEFT URETERAL STONE.  The various methods of treatment have been discussed with the patient and family. After consideration of risks, benefits and other options for treatment, the patient has consented to  Procedure(s) with comments: CYSTOSCOPY, LEFT URETEROSCOPY LEFT RETROGRADE PYELOGRAM, HOLMIUM LASER LITHOTRIPSY, AND LEFT URETERAL STENT PLACEMENT (Left) - 47 MINUTES as a surgical intervention.  The patient's history has been reviewed, patient examined, no change in status, stable for surgery.  I have reviewed the patient's chart and labs.  Questions were answered to the patient's satisfaction.     Danyka Merlin D Kaseem Vastine

## 2022-05-12 ENCOUNTER — Encounter (HOSPITAL_COMMUNITY): Payer: Self-pay | Admitting: Urology

## 2022-05-13 ENCOUNTER — Other Ambulatory Visit: Payer: Self-pay | Admitting: Urology

## 2022-05-18 ENCOUNTER — Encounter (HOSPITAL_COMMUNITY): Payer: Self-pay | Admitting: Urology

## 2022-05-18 ENCOUNTER — Other Ambulatory Visit: Payer: Self-pay

## 2022-05-18 ENCOUNTER — Encounter (HOSPITAL_COMMUNITY): Payer: Self-pay

## 2022-05-18 DIAGNOSIS — G4700 Insomnia, unspecified: Secondary | ICD-10-CM

## 2022-05-18 MED ORDER — ZOLPIDEM TARTRATE 5 MG PO TABS: 5.0000 mg | ORAL_TABLET | Freq: Every day | ORAL | 1 refills | Status: DC

## 2022-05-18 NOTE — Progress Notes (Signed)
Spoke to patient to give updated information for surgery on 05-25-22.  No change in medical history since last surgery.  Patient was advised to arrive at 12:45 and check in at admitting.  Reminded no solid food after midnight and that she can have clear liquids from midnight until 9:00 AM the day of surgery.  Patient's sister will be picking her up after surgery.  All questions answered and patient stated understanding.

## 2022-05-18 NOTE — Patient Instructions (Signed)
SURGICAL WAITING ROOM VISITATION Patients having surgery or a procedure may have no more than 2 support people in the waiting area - these visitors may rotate.    If the patient needs to stay at the hospital during part of their recovery, the visitor guidelines for inpatient rooms apply. Pre-op nurse will coordinate an appropriate time for 1 support person to accompany patient in pre-op.  This support person may not rotate.    Please refer to the [facility] website for the visitor guidelines for Inpatients (after your surgery is over and you are in a regular room).   Due to an increase in RSV and influenza rates and associated hospitalizations, children ages 39 and under may not visit patients in [place].     Your procedure is scheduled on: 05-25-22   Report to [facility] Main Entrance    Report to admitting at 12:45 PM   Call this number if you have problems the morning of surgery [phone]   Do not eat food :After Midnight.   After Midnight you may have the following liquids until 9:00 AM DAY OF SURGERY  Water Non-Citrus Juices (without pulp, NO RED) Carbonated Beverages Black Coffee (NO MILK/CREAM OR CREAMERS, sugar ok)  Clear Tea (NO MILK/CREAM OR CREAMERS, sugar ok) regular and decaf                             Plain Jell-O (NO RED)                                           Fruit ices (not with fruit pulp, NO RED)                                     Popsicles (NO RED)                                                               Sports drinks like Gatorade (NO RED)                 If you have questions, please contact your surgeon's office.   FOLLOW ANY ADDITIONAL PRE OP INSTRUCTIONS YOU RECEIVED FROM YOUR SURGEON'S OFFICE!!!     Oral Hygiene is also important to reduce your risk of infection.                                    Remember - BRUSH YOUR TEETH THE MORNING OF SURGERY WITH YOUR REGULAR TOOTHPASTE   Do NOT smoke after Midnight   Take  these medicines the morning of surgery with A SIP OF WATER: Esomeprazole Tamsulosin Okay to use Albuterol inhaler Tylenol if needed Hydrocodone if needed Ondansetron if needed                               You may not have any metal on your body including hair pins, jewelry, and body piercing  Do not wear make-up, lotions, powders, perfumes or deodorant  Do not wear nail polish including gel and S&S, artificial/acrylic nails, or any other type of covering on natural nails including finger and toenails. If you have artificial nails, gel coating, etc. that needs to be removed by a nail salon please have this removed prior to surgery or surgery may need to be canceled/ delayed if the surgeon/ anesthesia feels like they are unable to be safely monitored.   Do not shave  48 hours prior to surgery.    Do not bring valuables to the hospital. [place].   Contacts, dentures or bridgework may not be worn into surgery.  DO NOT [place]. PHARMACY WILL DISPENSE MEDICATIONS LISTED ON YOUR MEDICATION LIST TO YOU DURING YOUR ADMISSION [place]!    Patients discharged on the day of surgery will not be allowed to drive home.  Someone NEEDS to stay with you for the first 24 hours after anesthesia.              Please read over the following fact sheets you were given: IF [place] [phone]  If you received a COVID test during your pre-op visit  it is requested that you wear a mask when out in public, stay away from anyone that may not be feeling well and notify your surgeon if you develop symptoms. If you test positive for Covid or have been in contact with anyone that has tested positive in the last 10 days please notify you surgeon.  [place] - Preparing for Surgery Before surgery, you can play an important role.  Because skin is not sterile, your skin  needs to be as free of germs as possible.  You can reduce the number of germs on your skin by washing with CHG (chlorahexidine gluconate) soap before surgery.  CHG is an antiseptic cleaner which kills germs and bonds with the skin to continue killing germs even after washing. Please DO NOT use if you have an allergy to CHG or antibacterial soaps.  If your skin becomes reddened/irritated stop using the CHG and inform your nurse when you arrive at Short Stay. Do not shave (including legs and underarms) for at least 48 hours prior to the first CHG shower.  You may shave your face/neck.  Please follow these instructions carefully:  1.  Shower with CHG Soap the night before surgery and the  morning of surgery.  2.  If you choose to wash your hair, wash your hair first as usual with your normal  shampoo.  3.  After you shampoo, rinse your hair and body thoroughly to remove the shampoo.                             4.  Use CHG as you would any other liquid soap.  You can apply chg directly to the skin and wash.  Gently with a scrungie or clean washcloth.  5.  Apply the CHG Soap to your body ONLY FROM THE NECK DOWN.   Do   not use on face/ open                           Wound or open sores. Avoid contact with eyes, ears mouth and   genitals (private parts).  Wash face,  Genitals (private parts) with your normal soap.             6.  Wash thoroughly, paying special attention to the area where your    surgery  will be performed.  7.  Thoroughly rinse your body with warm water from the neck down.  8.  DO NOT shower/wash with your normal soap after using and rinsing off the CHG Soap.                9.  Pat yourself dry with a clean towel.            10.  Wear clean pajamas.            11.  Place clean sheets on your bed the night of your first shower and do not  sleep with pets. Day of Surgery : Do not apply any lotions/deodorants the morning of surgery.  Please wear clean clothes to the  hospital/surgery center.  FAILURE TO FOLLOW THESE INSTRUCTIONS MAY RESULT IN THE CANCELLATION OF YOUR SURGERY  PATIENT SIGNATURE_________________________________  NURSE SIGNATURE__________________________________  ________________________________________________________________________

## 2022-05-18 NOTE — Telephone Encounter (Signed)
Patient requesting a refill 

## 2022-05-25 ENCOUNTER — Encounter (HOSPITAL_COMMUNITY)
Admission: RE | Disposition: A | Discharge status: Home / Self Care | Payer: Self-pay | Source: Home / Self Care | Attending: Urology

## 2022-05-25 ENCOUNTER — Ambulatory Visit (HOSPITAL_BASED_OUTPATIENT_CLINIC_OR_DEPARTMENT_OTHER): Payer: PPO | Admitting: Anesthesiology

## 2022-05-25 ENCOUNTER — Ambulatory Visit (HOSPITAL_COMMUNITY): Payer: PPO | Admitting: Anesthesiology

## 2022-05-25 ENCOUNTER — Ambulatory Visit (HOSPITAL_COMMUNITY)
Admission: RE | Admit: 2022-05-25 | Discharge: 2022-05-25 | Disposition: A | Discharge status: Home / Self Care | Payer: PPO | Attending: Urology | Admitting: Urology

## 2022-05-25 ENCOUNTER — Encounter (HOSPITAL_COMMUNITY): Payer: Self-pay | Admitting: Urology

## 2022-05-25 ENCOUNTER — Ambulatory Visit (HOSPITAL_COMMUNITY): Payer: PPO

## 2022-05-25 DIAGNOSIS — K279 Peptic ulcer, site unspecified, unspecified as acute or chronic, without hemorrhage or perforation: Secondary | ICD-10-CM

## 2022-05-25 DIAGNOSIS — Z87442 Personal history of urinary calculi: Secondary | ICD-10-CM | POA: Insufficient documentation

## 2022-05-25 DIAGNOSIS — K219 Gastro-esophageal reflux disease without esophagitis: Secondary | ICD-10-CM | POA: Diagnosis not present

## 2022-05-25 DIAGNOSIS — N289 Disorder of kidney and ureter, unspecified: Secondary | ICD-10-CM

## 2022-05-25 DIAGNOSIS — K449 Diaphragmatic hernia without obstruction or gangrene: Secondary | ICD-10-CM | POA: Diagnosis not present

## 2022-05-25 DIAGNOSIS — G8929 Other chronic pain: Secondary | ICD-10-CM | POA: Diagnosis not present

## 2022-05-25 DIAGNOSIS — N201 Calculus of ureter: Secondary | ICD-10-CM

## 2022-05-25 DIAGNOSIS — N132 Hydronephrosis with renal and ureteral calculous obstruction: Secondary | ICD-10-CM | POA: Diagnosis not present

## 2022-05-25 DIAGNOSIS — Z87891 Personal history of nicotine dependence: Secondary | ICD-10-CM | POA: Insufficient documentation

## 2022-05-25 DIAGNOSIS — G4730 Sleep apnea, unspecified: Secondary | ICD-10-CM | POA: Insufficient documentation

## 2022-05-25 DIAGNOSIS — M549 Dorsalgia, unspecified: Secondary | ICD-10-CM | POA: Insufficient documentation

## 2022-05-25 DIAGNOSIS — M810 Age-related osteoporosis without current pathological fracture: Secondary | ICD-10-CM | POA: Insufficient documentation

## 2022-05-25 HISTORY — DX: Personal history of urinary calculi: Z87.442

## 2022-05-25 HISTORY — PX: CYSTOSCOPY/URETEROSCOPY/HOLMIUM LASER/STENT PLACEMENT: SHX6546

## 2022-05-25 LAB — BASIC METABOLIC PANEL
Anion gap: 7 (ref 5–15)
BUN: 12 mg/dL (ref 8–23)
CO2: 22 mmol/L (ref 22–32)
Calcium: 9.3 mg/dL (ref 8.9–10.3)
Chloride: 108 mmol/L (ref 98–111)
Creatinine, Ser: 0.64 mg/dL (ref 0.44–1.00)
GFR, Estimated: >60 mL/min (ref >60)
Glucose, Bld: 104 mg/dL — ABNORMAL HIGH (ref 70–99)
Potassium: 3.8 mmol/L (ref 3.5–5.1)
Sodium: 137 mmol/L (ref 135–145)

## 2022-05-25 SURGERY — CYSTOSCOPY/URETEROSCOPY/HOLMIUM LASER/STENT PLACEMENT
Anesthesia: General | Laterality: Left

## 2022-05-25 MED ORDER — DEXAMETHASONE SODIUM PHOSPHATE 10 MG/ML IJ SOLN
INTRAMUSCULAR | Status: DC | PRN
  Administered 2022-05-25: 10 mg via INTRAVENOUS

## 2022-05-25 MED ORDER — CIPROFLOXACIN HCL 500 MG PO TABS: 500.0000 mg | ORAL_TABLET | Freq: Once | ORAL | 0 refills | Status: AC

## 2022-05-25 MED ORDER — FENTANYL CITRATE PF 50 MCG/ML IJ SOSY: 25.0000 ug | PREFILLED_SYRINGE | INTRAMUSCULAR | Status: DC | PRN

## 2022-05-25 MED ORDER — PROPOFOL 1000 MG/100ML IV EMUL
INTRAVENOUS | Status: AC
  Filled 2022-05-25: qty 100

## 2022-05-25 MED ORDER — PROPOFOL 10 MG/ML IV BOLUS
INTRAVENOUS | Status: DC | PRN
  Administered 2022-05-25: 120 mg via INTRAVENOUS

## 2022-05-25 MED ORDER — ONDANSETRON HCL 4 MG/2ML IJ SOLN: 4.0000 mg | Freq: Once | INTRAMUSCULAR | Status: DC | PRN

## 2022-05-25 MED ORDER — DEXAMETHASONE SODIUM PHOSPHATE 10 MG/ML IJ SOLN
INTRAMUSCULAR | Status: AC
  Filled 2022-05-25: qty 1

## 2022-05-25 MED ORDER — LIDOCAINE HCL (PF) 2 % IJ SOLN
INTRAMUSCULAR | Status: AC
  Filled 2022-05-25: qty 5

## 2022-05-25 MED ORDER — ONDANSETRON HCL 4 MG/2ML IJ SOLN
INTRAMUSCULAR | Status: AC
  Filled 2022-05-25: qty 2

## 2022-05-25 MED ORDER — FENTANYL CITRATE (PF) 250 MCG/5ML IJ SOLN
INTRAMUSCULAR | Status: DC | PRN
  Administered 2022-05-25 (×2): 50 ug via INTRAVENOUS

## 2022-05-25 MED ORDER — ORAL CARE MOUTH RINSE: 15.0000 mL | Freq: Once | OROMUCOSAL | Status: AC

## 2022-05-25 MED ORDER — LIDOCAINE 2% (20 MG/ML) 5 ML SYRINGE
INTRAMUSCULAR | Status: DC | PRN
  Administered 2022-05-25: 60 mg via INTRAVENOUS

## 2022-05-25 MED ORDER — ONDANSETRON HCL 4 MG/2ML IJ SOLN
INTRAMUSCULAR | Status: DC | PRN
  Administered 2022-05-25: 4 mg via INTRAVENOUS

## 2022-05-25 MED ORDER — PROPOFOL 500 MG/50ML IV EMUL
INTRAVENOUS | Status: DC | PRN
  Administered 2022-05-25: 175 ug/kg/min via INTRAVENOUS

## 2022-05-25 MED ORDER — CHLORHEXIDINE GLUCONATE 0.12 % MT SOLN
15.0000 mL | Freq: Once | OROMUCOSAL | Status: AC
  Administered 2022-05-25: 15 mL via OROMUCOSAL

## 2022-05-25 MED ORDER — SODIUM CHLORIDE 0.9 % IR SOLN
Status: DC | PRN
  Administered 2022-05-25: 6000 mL

## 2022-05-25 MED ORDER — LACTATED RINGERS IV SOLN: INTRAVENOUS | Status: DC

## 2022-05-25 MED ORDER — CEFAZOLIN SODIUM-DEXTROSE 2-4 GM/100ML-% IV SOLN
2.0000 g | INTRAVENOUS | Status: AC
  Administered 2022-05-25: 2 g via INTRAVENOUS
  Filled 2022-05-25: qty 100

## 2022-05-25 MED ORDER — AMISULPRIDE (ANTIEMETIC) 5 MG/2ML IV SOLN: 10.0000 mg | Freq: Once | INTRAVENOUS | Status: DC | PRN

## 2022-05-25 MED ORDER — FENTANYL CITRATE (PF) 100 MCG/2ML IJ SOLN
INTRAMUSCULAR | Status: AC
  Filled 2022-05-25: qty 2

## 2022-05-25 SURGICAL SUPPLY — 20 items
BAG URO CATCHER STRL LF (MISCELLANEOUS) ×2 IMPLANT
BASKET ZERO TIP NITINOL 2.4FR (BASKET) IMPLANT
BSKT STON RTRVL ZERO TP 2.4FR (BASKET) ×1
CATH URETL OPEN 5X70 (CATHETERS) ×2 IMPLANT
CLOTH BEACON ORANGE TIMEOUT ST (SAFETY) ×2 IMPLANT
DRSG TEGADERM 2-3/8X2-3/4 SM (GAUZE/BANDAGES/DRESSINGS) IMPLANT
EXTRACTOR STONE 1.7FRX115CM (UROLOGICAL SUPPLIES) IMPLANT
GLOVE BIO SURGEON STRL SZ 6.5 (GLOVE) ×2 IMPLANT
GOWN STRL REUS W/ TWL LRG LVL3 (GOWN DISPOSABLE) ×2 IMPLANT
GOWN STRL REUS W/TWL LRG LVL3 (GOWN DISPOSABLE) ×1
GUIDEWIRE STR DUAL SENSOR (WIRE) ×2 IMPLANT
KIT TURNOVER KIT A (KITS) IMPLANT
MANIFOLD NEPTUNE II (INSTRUMENTS) ×2 IMPLANT
PACK CYSTO (CUSTOM PROCEDURE TRAY) ×2 IMPLANT
SHEATH NAVIGATOR HD 11/13X28 (SHEATH) IMPLANT
SHEATH NAVIGATOR HD 11/13X36 (SHEATH) IMPLANT
STENT URET 6FRX24 CONTOUR (STENTS) IMPLANT
TRACTIP FLEXIVA PULSE ID 200 (Laser) IMPLANT
TUBING CONNECTING 10 (TUBING) ×2 IMPLANT
TUBING UROLOGY SET (TUBING) ×2 IMPLANT

## 2022-05-25 NOTE — Interval H&P Note (Signed)
History and Physical Interval Note: Patient underwent previous left ureteral stent placement due to narrow ureter and now returns for staged procedure.  05/25/2022 1:57 PM  Anna Mckinney  has presented today for surgery, with the diagnosis of LEFT URETERAL STONE.  The various methods of treatment have been discussed with the patient and family. After consideration of risks, benefits and other options for treatment, the patient has consented to  Procedure(s): CYSTOSCOPY, LEFT URETEROSCOPY, LEFT RETROGRADE PYELOGRAM, HOLMIUM LASER LITHOTRIPSY, LEFT URETERAL STENT EXCHANGE (Left) as a surgical intervention.  The patient's history has been reviewed, patient examined, no change in status, stable for surgery.  I have reviewed the patient's chart and labs.  Questions were answered to the patient's satisfaction.     [name] D Khadir Roam

## 2022-05-25 NOTE — Anesthesia Postprocedure Evaluation (Signed)
Anesthesia Post Note  Patient: Anna Mckinney  Procedure(s) Performed: CYSTOSCOPY, LEFT URETEROSCOPY, HOLMIUM LASER LITHOTRIPSY, LEFT URETERAL STENT EXCHANGE (Left)     Patient location during evaluation: PACU Anesthesia Type: General Level of consciousness: awake and alert and oriented Pain management: pain level controlled Vital Signs Assessment: post-procedure vital signs reviewed and stable Respiratory status: spontaneous breathing, nonlabored ventilation and respiratory function stable Cardiovascular status: blood pressure returned to baseline and stable Postop Assessment: no apparent nausea or vomiting Anesthetic complications: no   No notable events documented.  Last Vitals:  Vitals:   05/25/22 1615 05/25/22 1630  BP: (!) 147/79 (!) 140/83  Pulse: 68 61  Resp: 14 20  Temp:    SpO2: 95% 97%    Last Pain:  Vitals:   05/25/22 1630  TempSrc:   PainSc: 0-No pain                 [name] A.

## 2022-05-25 NOTE — Discharge Instructions (Addendum)
DISCHARGE INSTRUCTIONS FOR KIDNEY STONE/URETERAL STENT   MEDICATIONS:  1. Resume all your other meds from home  2. AZO over the counter can help with the burning/stinging when you urinate. 3. Oxycodone is for moderate/severe pain, otherwise taking up to 1000 mg every 6 hours of plainTylenol will help treat your pain.   4. Take Cipro one hour prior to removal of your stent.    ACTIVITY:  1. No strenuous activity x 1week  2. No driving while on narcotic pain medications  3. Drink plenty of water  4. Continue to walk at home - you can still get blood clots when you are at home, so keep active, but don't over do it.  5. May return to work/school tomorrow or when you feel ready   **Please bring stone fragment to postop appointment for analysis  BATHING:  1. You can shower and we recommend daily showers  2. You have a string coming from your urethra: The stent string is attached to your ureteral stent. Do not pull on this.   SIGNS/SYMPTOMS TO CALL:  Please call us if you have a fever greater than 101.5, uncontrolled nausea/vomiting, uncontrolled pain, dizziness, unable to urinate, bloody urine, chest pain, shortness of breath, leg swelling, leg pain, redness around wound, drainage from wound, or any other concerns or questions.   You can reach [place] at [phone].   FOLLOW-UP:  1. You have a string attached to your stent, you may remove it on Friday, March 22. To do this, pull the stringsuntil the stent is completely removed. You may feel an odd sensation in your back.

## 2022-05-25 NOTE — Anesthesia Preprocedure Evaluation (Addendum)
Anesthesia Evaluation  Patient identified by MRN, date of birth, ID band Patient awake    Reviewed: Allergy & Precautions, H&P , NPO status , Patient's Chart, lab work & pertinent test results, reviewed documented beta blocker date and time   History of Anesthesia Complications (+) PONV and history of anesthetic complications  Airway Mallampati: I  TM Distance: >3 FB Neck ROM: Full    Dental no notable dental hx. (+) Teeth Intact, Dental Advisory Given, Caps   Pulmonary sleep apnea , former smoker   Pulmonary exam normal breath sounds clear to auscultation       Cardiovascular Exercise Tolerance: Good negative cardio ROS Normal cardiovascular exam Rhythm:Regular Rate:Normal     Neuro/Psych negative neurological ROS  negative psych ROS   GI/Hepatic negative GI ROS, Neg liver ROS, hiatal hernia, PUD,GERD  Medicated,,  Endo/Other  negative endocrine ROS    Renal/GU negative Renal ROSLeft ureteral Calculus  negative genitourinary   Musculoskeletal negative musculoskeletal ROS (+)  Osteoporosis Chronic back pain   Abdominal   Peds negative pediatric ROS (+)  Hematology negative hematology ROS (+)   Anesthesia Other Findings   Reproductive/Obstetrics negative OB ROS                              Anesthesia Physical Anesthesia Plan  ASA: 3  Anesthesia Plan: General   Post-op Pain Management: Minimal or no pain anticipated   Induction: Intravenous  PONV Risk Score and Plan: 4 or greater and Ondansetron, Treatment may vary due to age or medical condition, TIVA and Propofol infusion  Airway Management Planned: LMA  Additional Equipment: None  Intra-op Plan:   Post-operative Plan: Extubation in OR  Informed Consent: I have reviewed the patients History and Physical, chart, labs and discussed the procedure including the risks, benefits and alternatives for the proposed anesthesia  with the patient or authorized representative who has indicated his/her understanding and acceptance.     Dental advisory given  Plan Discussed with: CRNA and Anesthesiologist  Anesthesia Plan Comments: ( )        Anesthesia Quick Evaluation

## 2022-05-25 NOTE — Transfer of Care (Signed)
Immediate Anesthesia Transfer of Care Note  Patient: Anna Mckinney  Procedure(s) Performed: CYSTOSCOPY, LEFT URETEROSCOPY, HOLMIUM LASER LITHOTRIPSY, LEFT URETERAL STENT EXCHANGE (Left)  Patient Location: PACU  Anesthesia Type:General  Level of Consciousness: sedated  Airway & Oxygen Therapy: Patient Spontanous Breathing and Patient connected to face mask oxygen  Post-op Assessment: Report given to RN and Post -op Vital signs reviewed and stable  Post vital signs: Reviewed and stable  Last Vitals:  Vitals Value Taken Time  BP    Temp    Pulse 83 05/25/22 1604  Resp 13 05/25/22 1604  SpO2 96 % 05/25/22 1604  Vitals shown include unvalidated device data.  Last Pain:  Vitals:   05/25/22 1310  TempSrc:   PainSc: 3       Patients Stated Pain Goal: 3 ([id number])  Complications: No notable events documented.

## 2022-05-25 NOTE — Op Note (Signed)
Preoperative diagnosis: left ureteral calculus  Postoperative diagnosis: left ureteral calculus  Procedure:  Cystoscopy left ureteroscopy, laser lithotripsy, basket stone extraction left 6F x 24cm ureteral stent exchange - with tether    Surgeon: Jacalyn Lefevre, MD  Anesthesia: General  Complications: None  Intraoperative findings:  Normal urethra Bladder mucosa normal without masses  5 mm left mid ureteral calculus  EBL: Minimal  Specimens: left ureteral calculus give to pt  Disposition of specimens: [facility] for stone analysis  Indication: Anna Mckinney is a 72 y.o.   patient with a 59mm left ureteral stone and associated left symptoms who underwent left ureteral stent placement approximately 2 weeks ago and now returns for definitive stone removal. After reviewing the management options for treatment, the patient elected to proceed with the above surgical procedure(s). We have discussed the potential benefits and risks of the procedure, side effects of the proposed treatment, the likelihood of the patient achieving the goals of the procedure, and any potential problems that might occur during the procedure or recuperation. Informed consent has been obtained.   Description of procedure:  The patient was taken to the operating room and general anesthesia was induced.  The patient was placed in the dorsal lithotomy position, prepped and draped in the usual sterile fashion, and preoperative antibiotics were administered. A preoperative time-out was performed.   Cystourethroscopy was performed.  The patient's urethra was examined and was normal. The bladder was then systematically examined in its entirety. There was no evidence for any bladder tumors, stones, or other mucosal pathology.    Attention then turned to the left ureteral orifice and the existing ureteral stent was grasped with a grasper and brought to the urethral meatus.  It was then cannulated  the sensor wire advanced acute fluoroscopic guidance.  The stent was examined, deemed to be intact and discarded.  Next an semirigid ureteroscope was advanced alongside the wire until the stone was encountered.     The stone was then fragmented with the 242 micron holmium laser fiber.   All stones were then removed from the ureter with a 0 tip basket.  Reinspection of the ureter revealed no remaining visible stones or fragments.   The wire was then backloaded through the cystoscope and a ureteral stent was advance over the wire using Seldinger technique.  The stent was positioned appropriately under fluoroscopic and cystoscopic guidance.  The wire was then removed with an adequate stent curl noted in the renal pelvis as well as in the bladder.  The bladder was then emptied and the procedure ended.  The patient appeared to tolerate the procedure well and without complications.  The patient was able to be awakened and transferred to the recovery unit in satisfactory condition.   Disposition: The tether of the stent was left on and tucked inside the patient's vagina.  Instructions for removing the stent have been provided to the patient.

## 2022-05-25 NOTE — Anesthesia Procedure Notes (Signed)
Procedure Name: LMA Insertion Date/Time: 05/25/2022 3:29 PM  Performed by: Jenne Campus, CRNAPre-anesthesia Checklist: Patient identified, Emergency Drugs available, Suction available and Patient being monitored Patient Re-evaluated:Patient Re-evaluated prior to induction Oxygen Delivery Method: Circle System Utilized Preoxygenation: Pre-oxygenation with 100% oxygen Induction Type: IV induction Ventilation: Mask ventilation without difficulty LMA: LMA inserted LMA Size: 4.0 Number of attempts: 1 Placement Confirmation: positive ETCO2 and breath sounds checked- equal and bilateral Tube secured with: Tape Dental Injury: Teeth and Oropharynx as per pre-operative assessment

## 2022-05-26 ENCOUNTER — Ambulatory Visit
Admission: RE | Admit: 2022-05-26 | Discharge: 2022-05-26 | Disposition: A | Discharge status: Home / Self Care | Payer: PPO | Source: Ambulatory Visit | Attending: Family Medicine | Admitting: Family Medicine

## 2022-05-26 ENCOUNTER — Encounter (HOSPITAL_COMMUNITY): Payer: Self-pay | Admitting: Urology

## 2022-05-26 DIAGNOSIS — Z1231 Encounter for screening mammogram for malignant neoplasm of breast: Secondary | ICD-10-CM

## 2022-05-28 ENCOUNTER — Telehealth: Payer: Self-pay

## 2022-05-28 ENCOUNTER — Other Ambulatory Visit: Payer: Self-pay | Admitting: Family Medicine

## 2022-05-28 DIAGNOSIS — R928 Other abnormal and inconclusive findings on diagnostic imaging of breast: Secondary | ICD-10-CM

## 2022-05-28 NOTE — Telephone Encounter (Signed)
Pt called. Said she was returning a call but there's not a note. She said it may have been about her mammogram. I advised her I would send back a note in case it was you who called her.

## 2022-06-10 ENCOUNTER — Ambulatory Visit
Admission: RE | Admit: 2022-06-10 | Discharge: 2022-06-10 | Disposition: A | Discharge status: Home / Self Care | Payer: PPO | Source: Ambulatory Visit | Attending: Family Medicine | Admitting: Family Medicine

## 2022-06-10 ENCOUNTER — Ambulatory Visit: Payer: PPO

## 2022-06-10 DIAGNOSIS — R922 Inconclusive mammogram: Secondary | ICD-10-CM | POA: Diagnosis not present

## 2022-06-10 DIAGNOSIS — R928 Other abnormal and inconclusive findings on diagnostic imaging of breast: Secondary | ICD-10-CM

## 2022-11-02 ENCOUNTER — Encounter: Payer: Self-pay | Admitting: Family Medicine

## 2022-11-02 ENCOUNTER — Ambulatory Visit (INDEPENDENT_AMBULATORY_CARE_PROVIDER_SITE_OTHER): Payer: PPO | Admitting: Family Medicine

## 2022-11-02 VITALS — BP 130/70 | HR 60 | Temp 98.8°F | Ht 64.0 in | Wt 149.0 lb

## 2022-11-02 DIAGNOSIS — R3989 Other symptoms and signs involving the genitourinary system: Secondary | ICD-10-CM | POA: Diagnosis not present

## 2022-11-02 DIAGNOSIS — N3000 Acute cystitis without hematuria: Secondary | ICD-10-CM | POA: Diagnosis not present

## 2022-11-02 LAB — POCT URINALYSIS DIP (PROADVANTAGE DEVICE)
Bilirubin, UA: NEGATIVE
Blood, UA: NEGATIVE
Glucose, UA: NEGATIVE mg/dL
Ketones, POC UA: NEGATIVE mg/dL
Nitrite, UA: NEGATIVE
Protein Ur, POC: NEGATIVE mg/dL
Specific Gravity, Urine: 1.02
Urobilinogen, Ur: 0.2
pH, UA: 6.5 (ref 5.0–8.0)

## 2022-11-02 MED ORDER — SULFAMETHOXAZOLE-TRIMETHOPRIM 800-160 MG PO TABS: 1.0000 | ORAL_TABLET | Freq: Two times a day (BID) | ORAL | 0 refills | Status: DC

## 2022-11-02 NOTE — Progress Notes (Signed)
---+++++++++++--------------------------+++++++++++++++++++++++++++++++++++++++++++++++++    Subjective:    Patient ID: Anna Mckinney, female    DOB: [birth year], 72 y.o.   MRN: [id number]  HPI She complains of a 2-week history of lower abdominal pressure, urgency, frequency.  The pressure is relieved when she urinates.  No fever or chills.  She would also like a refill on her Ambien.   Review of Systems     Objective:    Physical Exam Alert and in no distress.  Urinalysis did show white cells however the microscopic was contaminated.       Assessment & Plan:  Acute cystitis without hematuria - Plan: sulfamethoxazole-trimethoprim (BACTRIM DS) 800-160 MG tablet  Sensation of pressure in bladder area - Plan: POCT Urinalysis DIP (Proadvantage Device) Also recommend Azo Standard to help with the symptoms.

## 2022-11-02 NOTE — Patient Instructions (Signed)
use Azo Standard for your symptoms and make sure you take all the antibiotic.

## 2022-11-15 ENCOUNTER — Telehealth (INDEPENDENT_AMBULATORY_CARE_PROVIDER_SITE_OTHER): Payer: PPO | Admitting: Medical

## 2022-11-15 ENCOUNTER — Encounter: Payer: Self-pay | Admitting: Medical

## 2022-11-15 VITALS — Temp 101.0°F | Ht 63.5 in | Wt 142.0 lb

## 2022-11-15 DIAGNOSIS — R051 Acute cough: Secondary | ICD-10-CM

## 2022-11-15 DIAGNOSIS — J988 Other specified respiratory disorders: Secondary | ICD-10-CM

## 2022-11-15 DIAGNOSIS — R5383 Other fatigue: Secondary | ICD-10-CM | POA: Diagnosis not present

## 2022-11-15 MED ORDER — AZITHROMYCIN 250 MG PO TABS: ORAL_TABLET | ORAL | 0 refills | Status: DC

## 2022-11-15 NOTE — Progress Notes (Signed)
Subjective:     Patient ID: Anna Mckinney, female   DOB: 1951/02/20, 72 y.o.   MRN: [id number]  This visit type was conducted due to national recommendations for restrictions regarding the COVID-19 Pandemic (e.g. social distancing) in an effort to limit this patient's exposure and mitigate transmission in our community.  Due to their co-morbid illnesses, this patient is at least at moderate risk for complications without adequate follow up.  This format is felt to be most appropriate for this patient at this time.    Documentation for virtual audio and video telecommunications through [facility] encounter:  The patient was located at home. The provider was located in the office. The patient did consent to this visit and is aware of possible charges through their insurance for this visit.  The other persons participating in this telemedicine service were none. Time spent on call was 20 minutes and in review of previous records 20 minutes total.  This virtual service is not related to other E/M service within previous 7 days.   HPI Chief Complaint  Patient presents with   Cough    VIRTUAL cough,congestion and fever that started Sunday.   Virtual consult for illness.  She had her husband and daughter all are sick currently.  She started getting sick 2 days ago.  She notes throat clogged up with congestion, irritated throat like sandpaper, stuffy in the head, cough, fatigue, body aches and chills, headache and not sleeping well.  No shortness of breath or wheezing.  No nausea vomiting or diarrhea.  She feels like she is hydrating pretty well.  Using Tylenol for symptoms.  Her husband started with the initial symptoms a week ago but he never vaginalis got sick.  No COVID test so far.  No other symptoms. No other aggravating or relieving factors. No other complaint.   Past Medical History:  Diagnosis Date   Barrett's esophagus     [place] (Dr. Noe Gens)   Blood transfusion without  reported diagnosis    Cataract    bilateral-removed   Gastritis    GERD (gastroesophageal reflux disease)    Hemorrhoid    History of hiatal hernia    History of kidney stones    Hyperplastic colon polyp 11/23/2010   Dr Nance Pew   Insomnia    Internal prolapsed hemorrhoids s/p THD hemorrhoidal ligation/pexy 10/16/2012   Osteopenia    PONV (postoperative nausea and vomiting)    PT'S HEART RATE DOWN TO 40 WITH HER LAST SURGERY - SHE REMEMBERS BEING ASKED TO WAKE UP SO HEART RATE WOULD GO UP - SCARED HER.   Rectal prolapse    Sleep apnea 2015   mild, pt denies 01/02/19   Ulcer of esophagus 10/08/2009   Dr Nance Pew   Current Outpatient Medications on File Prior to Visit  Medication Sig Dispense Refill   acetaminophen (TYLENOL) 500 MG tablet Take 500-1,000 mg by mouth every 6 (six) hours as needed for moderate pain.     Ascorbic Acid (VITAMIN C ER PO) Take 1 tablet by mouth daily.     Cholecalciferol (VITAMIN D3) 5000 UNITS TABS Take 1 tablet by mouth daily.     MAGNESIUM PO Take 1 tablet by mouth daily.     Misc Natural Products (ELDERBERRY ZINC/VIT C/IMMUNE MT) Take 1 tablet by mouth daily.     Probiotic Product (PRO-BIOTIC BLEND PO) Take 1 capsule by mouth daily.     zolpidem (AMBIEN) 5 MG tablet Take 1 tablet (5 mg total) by  mouth at bedtime. 90 tablet 1   albuterol (VENTOLIN) 2 MG/5ML syrup Take 7.5 mLs (3 mg total) by mouth 3 (three) times daily. prn (Patient not taking: Reported on 11/02/2022) 120 mL 1   celecoxib (CELEBREX) 200 MG capsule Take 1 capsule (200 mg total) by mouth 2 (two) times daily. (Patient not taking: Reported on 11/02/2022) 20 capsule 0   esomeprazole (NEXIUM) 40 MG capsule Take 1 capsule (40 mg total) by mouth as needed. (Patient not taking: Reported on 11/02/2022) 90 capsule 3   No current facility-administered medications on file prior to visit.     Review of Systems As in subjective    Objective:   Physical Exam Due to coronavirus pandemic stay  at home measures, patient visit was virtual and they were not examined in person.   Gen: Somewhat ill-appearing, no acute distress No labored breathing or wheezing     Assessment:     Encounter Diagnoses  Name Primary?   Acute cough Yes   Respiratory tract infection    Other fatigue        Plan:     We discussed symptoms and concerns, limitations of virtual consult.  I asked her to do a home COVID test.  She will do this and we will call her back  We called back about 20 minutes later and she noted that she and her daughter she do both negative for COVID.  They have been sick the shortest period of time  Offered chest x-ray and flu swabs and COVID swabs here but she declines.  At this point continue to hydrate, rest, can use Mucinex with thicker mucus or Benadryl for runny nose and clear mucus, begin medication below and she can use some of the Promethazine DM called out for her daughter as well.  If much worse or not improving over the next 48 to 72 hours then get reevaluated   Nabiha was seen today for cough.  Diagnoses and all orders for this visit:  Acute cough  Respiratory tract infection  Other fatigue  Other orders -     azithromycin (ZITHROMAX) 250 MG tablet; 2 tablets day 1, then 1 tablet days 2-4  F/u prn

## 2022-11-16 ENCOUNTER — Ambulatory Visit (INDEPENDENT_AMBULATORY_CARE_PROVIDER_SITE_OTHER): Payer: PPO

## 2022-11-16 DIAGNOSIS — Z0000 Encounter for general adult medical examination without abnormal findings: Secondary | ICD-10-CM | POA: Diagnosis not present

## 2022-11-16 NOTE — Progress Notes (Signed)
Subjective:   Anna Mckinney is a 72 y.o. female who presents for Medicare Annual (Subsequent) preventive examination.  Visit Complete: Virtual  I connected with  Anna Mckinney on 11/16/22 by a audio enabled telemedicine application and verified that I am speaking with the correct person using two identifiers.  Patient Location: Home  Provider Location: Office/Clinic  I discussed the limitations of evaluation and management by telemedicine. The patient expressed understanding and agreed to proceed.  Vital Signs: Unable to obtain new vitals due to this being a telehealth visit.  Review of Systems     Cardiac Risk Factors include: advanced age (>51men, >72 women)     Objective:    Today's Vitals   There is no height or weight on file to calculate BMI.     11/16/2022    3:35 PM 05/25/2022    1:08 PM 05/07/2022    8:49 AM 04/24/2022    3:11 PM 02/04/2020    2:13 PM 12/02/2016    9:55 AM 12/02/2015    8:57 AM  Advanced Directives  Does Patient Have a Medical Advance Directive? No No No No No No No  Would patient like information on creating a medical advance directive?   No - Patient declined  Yes (ED - Information included in AVS) Yes (MAU/Ambulatory/Procedural Areas - Information given) Yes - Educational materials given    Current Medications (verified) Outpatient Encounter Medications as of 11/16/2022  Medication Sig   acetaminophen (TYLENOL) 500 MG tablet Take 500-1,000 mg by mouth every 6 (six) hours as needed for moderate pain.   Ascorbic Acid (VITAMIN C ER PO) Take 1 tablet by mouth daily.   Cholecalciferol (VITAMIN D3) 5000 UNITS TABS Take 1 tablet by mouth daily.   MAGNESIUM PO Take 1 tablet by mouth daily.   Misc Natural Products (ELDERBERRY ZINC/VIT C/IMMUNE MT) Take 1 tablet by mouth daily.   Probiotic Product (PRO-BIOTIC BLEND PO) Take 1 capsule by mouth daily.   zolpidem (AMBIEN) 5 MG tablet Take 1 tablet (5 mg total) by mouth at bedtime.   albuterol  (VENTOLIN) 2 MG/5ML syrup Take 7.5 mLs (3 mg total) by mouth 3 (three) times daily. prn (Patient not taking: Reported on 11/16/2022)   azithromycin (ZITHROMAX) 250 MG tablet 2 tablets day 1, then 1 tablet days 2-4 (Patient not taking: Reported on 11/16/2022)   celecoxib (CELEBREX) 200 MG capsule Take 1 capsule (200 mg total) by mouth 2 (two) times daily. (Patient not taking: Reported on 11/02/2022)   esomeprazole (NEXIUM) 40 MG capsule Take 1 capsule (40 mg total) by mouth as needed. (Patient not taking: Reported on 11/02/2022)   No facility-administered encounter medications on file as of 11/16/2022.    Allergies (verified) Hydrocodone, Codeine, Doxycycline, and Oxycodone hcl   History: Past Medical History:  Diagnosis Date   Barrett's esophagus     [place] (Dr. Noe Gens)   Blood transfusion without reported diagnosis    Cataract    bilateral-removed   Gastritis    GERD (gastroesophageal reflux disease)    Hemorrhoid    History of hiatal hernia    History of kidney stones    Hyperplastic colon polyp 11/23/2010   Dr Nance Pew   Insomnia    Internal prolapsed hemorrhoids s/p THD hemorrhoidal ligation/pexy 10/16/2012   Osteopenia    PONV (postoperative nausea and vomiting)    PT'S HEART RATE DOWN TO 40 WITH HER LAST SURGERY - SHE REMEMBERS BEING ASKED TO WAKE UP SO HEART RATE WOULD GO  UP - SCARED HER.   Rectal prolapse    Sleep apnea 2015   mild, pt denies 01/02/19   Ulcer of esophagus 10/08/2009   Dr Nance Pew   Past Surgical History:  Procedure Laterality Date   ABDOMINAL HYSTERECTOMY  1984   1 ovary remains; removed for "cancer cells"   CATARACT EXTRACTION Bilateral end of 2015 & early 2016   Dr. Delaney Meigs   CHOLECYSTECTOMY  2010   COLONOSCOPY     COLONOSCOPY W/ BIOPSIES     CYSTOSCOPY/URETEROSCOPY/HOLMIUM LASER/STENT PLACEMENT Left 05/11/2022   Procedure: CYSTOSCOPY, LEFT URETEROSCOPY LEFT RETROGRADE PYELOGRAM, AND LEFT URETERAL STENT PLACEMENT;  Surgeon: Noel Christmas, MD;  Location: [facility];  Service: Urology;  Laterality: Left;  60 MINUTES   CYSTOSCOPY/URETEROSCOPY/HOLMIUM LASER/STENT PLACEMENT Left 05/25/2022   Procedure: CYSTOSCOPY, LEFT URETEROSCOPY, HOLMIUM LASER LITHOTRIPSY, LEFT URETERAL STENT EXCHANGE;  Surgeon: Noel Christmas, MD;  Location: [facility];  Service: Urology;  Laterality: Left;   ESOPHAGOGASTRODUODENOSCOPY     EVALUATION UNDER ANESTHESIA WITH HEMORRHOIDECTOMY N/A 01/25/2013   Procedure: EXAM UNDER ANESTHESIA WITH HEMORRHOIDECTOMY;  Surgeon: Ardeth Sportsman, MD;  Location: [facility];  Service: General;  Laterality: N/A;   posterolateral external hemorrhoidactomy  10/20/2012   left   SKIN BIOPSY Left    see chart.  Skin biopsy rontal let scalp, pt denies   TRANSANAL HEMORRHOIDAL DEARTERIALIZATION  10/20/2012   hemorrhoidal ligation and pexy.   Family History  Problem Relation Age of Onset   Breast cancer Mother 15   Cancer Mother        breast cancer   COPD Father    Heart disease Father        CHF   Atrial fibrillation Father    Esophageal cancer Father    Angelman syndrome Daughter    Seizures Daughter    Thyroid disease Sister    Diabetes Sister        diet controlled   Arthritis Sister        rheumatoid   Hyperlipidemia Brother    Colon cancer Sister 69   Cancer Paternal Grandmother        stomach   Stomach cancer Paternal Grandmother    Social History   Socioeconomic History   Marital status: Married    Spouse name: Not on file   Number of children: 2   Years of education: Not on file   Highest education level: Not on file  Occupational History   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  Tobacco Use   Smoking status: Former    Current packs/day: 0.00    Average packs/day: 0.3 packs/day for 20.0 years (6.0 ttl pk-yrs)    Types: Cigarettes    Start date: 01/24/1989    Quit date: 01/24/2009    Years since quitting: 13.8   Smokeless tobacco: Never  Vaping Use   Vaping status: Never Used  Substance  and Sexual Activity   Alcohol use: No    Alcohol/week: 0.0 standard drinks of alcohol   Drug use: No   Sexual activity: Not Currently  Other Topics Concern   Not on file  Social History Narrative   Lives with husband, daughter (with Angelman Lorenza [place] is her caregiver) and 1 dog (boxer)   Social Determinants of Health   Financial Resource Strain: Low Risk  (11/16/2022)   Overall Financial Resource Strain (CARDIA)    Difficulty of Paying Living Expenses: Not hard at all  Food Insecurity: No Food Insecurity (11/16/2022)   Hunger Vital Sign  Worried About Programme researcher, broadcasting/film/video in the Last Year: Never true    Ran Out of Food in the Last Year: Never true  Transportation Needs: No Transportation Needs (11/16/2022)   PRAPARE - Administrator, Civil Service (Medical): No    Lack of Transportation (Non-Medical): No  Physical Activity: Inactive (11/16/2022)   Exercise Vital Sign    Days of Exercise per Week: 0 days    Minutes of Exercise per Session: 0 min  Stress: No Stress Concern Present (11/16/2022)   [employer] of Occupational Health - Occupational Stress Questionnaire    Feeling of Stress : Only a little  Social Connections: Moderately Integrated (11/16/2022)   Social Connection and Isolation Panel [NHANES]    Frequency of Communication with Friends and Family: More than three times a week    Frequency of Social Gatherings with Friends and Family: Twice a week    Attends Religious Services: More than 4 times per year    Active Member of [employer] or Organizations: No    Attends Engineer, structural: Never    Marital Status: Married    Tobacco Counseling Counseling given: Not Answered   Clinical Intake:  Pre-visit preparation completed: Yes  Pain : No/denies pain     Nutritional Risks: None Diabetes: No  How often do you need to have someone help you when you read instructions, pamphlets, or other written materials from your doctor or pharmacy?:  1 - Never  Interpreter Needed?: No  Information entered by :: NAllen LPN   Activities of Daily Living    11/16/2022    3:27 PM 05/07/2022    8:51 AM  In your present state of health, do you have any difficulty performing the following activities:  Hearing? 0   Vision? 1   Comment bad right eye   Difficulty concentrating or making decisions? 0   Walking or climbing stairs? 0   Dressing or bathing? 0   Doing errands, shopping? 0 0  Preparing Food and eating ? N   Using the Toilet? N   In the past six months, have you accidently leaked urine? N   Do you have problems with loss of bowel control? N   Managing your Medications? N   Managing your Finances? N   Housekeeping or managing your Housekeeping? N     Patient Care Team: Ronnald Nian, MD as PCP - General (Family Medicine) Sharrell Ku, MD as Consulting Physician (Gastroenterology)  Indicate any recent Medical Services you may have received from other than [facility] providers in the past year (date may be approximate).     Assessment:   This is a routine wellness examination for Anna Mckinney.  Hearing/Vision screen Hearing Screening - Comments:: Denies hearing issues Vision Screening - Comments:: Regular eye exams, [place]   Goals Addressed             This Visit's Progress    Patient Stated       11/16/2022, stay healthy       Depression Screen    11/16/2022    3:37 PM 11/26/2021   10:59 AM 02/13/2021    9:02 AM 02/04/2020    2:14 PM 12/05/2017    1:42 PM 12/02/2016    9:15 AM 12/02/2015    8:22 AM  PHQ 2/9 Scores  PHQ - 2 Score 0 0 0 0 0 0 0  PHQ- 9 Score 3          Fall Risk  11/16/2022    3:36 PM 02/13/2021    9:02 AM 02/04/2020    2:13 PM 01/31/2019    9:38 AM 12/05/2017    1:42 PM  Fall Risk   Falls in the past year? 0 0 0 0 No  Comment    Emmi Telephone Survey: data to providers prior to load   Number falls in past yr: 0 0     Injury with Fall? 0 0     Risk for fall due to : Medication  side effect No Fall Risks     Follow up Falls prevention discussed;Falls evaluation completed Falls evaluation completed       MEDICARE RISK AT HOME: Medicare Risk at Home Any stairs in or around the home?: Yes (has a ramp) If so, are there any without handrails?: No Home free of loose throw rugs in walkways, pet beds, electrical cords, etc?: Yes Adequate lighting in your home to reduce risk of falls?: Yes Life alert?: No Use of a cane, walker or w/c?: No Grab bars in the bathroom?: Yes Shower chair or bench in shower?: Yes Elevated toilet seat or a handicapped toilet?: Yes  TIMED UP AND GO:  Was the test performed?  No    Cognitive Function:        11/16/2022    3:39 PM  6CIT Screen  What Year? 0 points  What month? 0 points  What time? 0 points  Count back from 20 0 points  Months in reverse 0 points  Repeat phrase 0 points  Total Score 0 points    Immunizations Immunization History  Administered Date(s) Administered   Fluad Quad(high Dose 65+) 12/25/2018, 12/13/2019, 01/01/2021, 01/01/2022   Influenza Split 01/12/2011, 12/17/2011   Influenza Whole 01/06/2007, 11/27/2007, 11/27/2009   Influenza, High Dose Seasonal PF 12/02/2016, 12/22/2017   Influenza,inj,Quad PF,6+ Mos 12/14/2012, 01/03/2014, 11/18/2014, 12/02/2015   PFIZER(Purple Top)SARS-COV-2 Vaccination 05/16/2019, 06/08/2019   Pneumococcal Conjugate-13 12/02/2016   Pneumococcal Polysaccharide-23 07/09/2005   Td 07/09/2005   Tdap 07/29/2011   Zoster, Live 10/01/2013    TDAP status: Due, Education has been provided regarding the importance of this vaccine. Advised may receive this vaccine at local pharmacy or Health Dept. Aware to provide a copy of the vaccination record if obtained from local pharmacy or Health Dept. Verbalized acceptance and understanding.  Flu Vaccine status: Due, Education has been provided regarding the importance of this vaccine. Advised may receive this vaccine at local pharmacy or  Health Dept. Aware to provide a copy of the vaccination record if obtained from local pharmacy or Health Dept. Verbalized acceptance and understanding.  Pneumococcal vaccine status: Up to date  Covid-19 vaccine status: Completed vaccines  Qualifies for Shingles Vaccine? Yes   Zostavax completed Yes   Shingrix Completed?: No.    Education has been provided regarding the importance of this vaccine. Patient has been advised to call insurance company to determine out of pocket expense if they have not yet received this vaccine. Advised may also receive vaccine at local pharmacy or Health Dept. Verbalized acceptance and understanding.  Screening Tests Health Maintenance  Topic Date Due   Zoster Vaccines- Shingrix (1 of 2) 08/11/2001   DTaP/Tdap/Td (3 - Td or Tdap) 07/28/2021   INFLUENZA VACCINE  10/07/2022   Medicare Annual Wellness (AWV)  11/16/2023   MAMMOGRAM  05/25/2024   Colonoscopy  01/01/2026   DEXA SCAN  Completed   Hepatitis C Screening  Completed   HPV VACCINES  Aged Out   Pneumonia Vaccine  70+ Years old  Discontinued   COVID-19 Vaccine  Discontinued    Health Maintenance  Health Maintenance Due  Topic Date Due   Zoster Vaccines- Shingrix (1 of 2) 08/11/2001   DTaP/Tdap/Td (3 - Td or Tdap) 07/28/2021   INFLUENZA VACCINE  10/07/2022    Colorectal cancer screening: Type of screening: Colonoscopy. Completed 01/02/2019. Repeat every 7 years  Mammogram status: Completed 05/26/2022. Repeat every year  Bone Density status: Completed 02/13/2020.   Lung Cancer Screening: (Low Dose CT Chest recommended if Age 72-80 years, 20 pack-year currently smoking OR have quit w/in 15years.) does not qualify.   Lung Cancer Screening Referral: no  Additional Screening:  Hepatitis C Screening: does qualify; Completed 12/02/2015  Vision Screening: Recommended annual ophthalmology exams for early detection of glaucoma and other disorders of the eye. Is the patient up to date with their  annual eye exam?  Yes  Who is the provider or what is the name of the office in which the patient attends annual eye exams? Dr. Delaney Meigs If pt is not established with a provider, would they like to be referred to a provider to establish care? No .   Dental Screening: Recommended annual dental exams for proper oral hygiene  Diabetic Foot Exam: n/a  Community Resource Referral / Chronic Care Management: CRR required this visit?  No   CCM required this visit?  No     Plan:     I have personally reviewed and noted the following in the patient's chart:   Medical and social history Use of alcohol, tobacco or illicit drugs  Current medications and supplements including opioid prescriptions. Patient is not currently taking opioid prescriptions. Functional ability and status Nutritional status Physical activity Advanced directives List of other physicians Hospitalizations, surgeries, and ER visits in previous 12 months Vitals Screenings to include cognitive, depression, and falls Referrals and appointments  In addition, I have reviewed and discussed with patient certain preventive protocols, quality metrics, and best practice recommendations. A written personalized care plan for preventive services as well as general preventive health recommendations were provided to patient.     Barb Merino, LPN   1/61/0960   After Visit Summary: (Pick Up) Due to this being a telephonic visit, with patients personalized plan was offered to patient and patient has requested to Pick up at office.  Nurse Notes: none

## 2022-11-16 NOTE — Patient Instructions (Signed)
Anna Mckinney , Thank you for taking time to come for your Medicare Wellness Visit. I appreciate your ongoing commitment to your health goals. Please review the following plan we discussed and let me know if I can assist you in the future.   Referrals/Orders/Follow-Ups/Clinician Recommendations: none  This is a list of the screening recommended for you and due dates:  Health Maintenance  Topic Date Due   Zoster (Shingles) Vaccine (1 of 2) 08/11/2001   DTaP/Tdap/Td vaccine (3 - Td or Tdap) 07/28/2021   Flu Shot  10/07/2022   Medicare Annual Wellness Visit  11/16/2023   Mammogram  05/25/2024   Colon Cancer Screening  01/01/2026   DEXA scan (bone density measurement)  Completed   Hepatitis C Screening  Completed   HPV Vaccine  Aged Out   Pneumonia Vaccine  Discontinued   COVID-19 Vaccine  Discontinued    Advanced directives: (ACP Link)Information on Advanced Care Planning can be found at [facility] [place] Advance Health Care Directives Advance Health Care Directives ([email or web address]   Next Medicare Annual Wellness Visit scheduled for next year: Yes  Insert Preventive Care attachment Insert FALL PREVENTION attachment if needed

## 2022-11-22 ENCOUNTER — Other Ambulatory Visit: Payer: Self-pay | Admitting: Family Medicine

## 2022-11-22 DIAGNOSIS — G4700 Insomnia, unspecified: Secondary | ICD-10-CM

## 2022-12-03 ENCOUNTER — Encounter: Payer: PPO | Admitting: Family Medicine

## 2022-12-07 ENCOUNTER — Ambulatory Visit (INDEPENDENT_AMBULATORY_CARE_PROVIDER_SITE_OTHER): Payer: PPO | Admitting: Family Medicine

## 2022-12-07 ENCOUNTER — Encounter: Payer: Self-pay | Admitting: Family Medicine

## 2022-12-07 VITALS — BP 130/82 | HR 52 | Ht 63.0 in | Wt 147.8 lb

## 2022-12-07 DIAGNOSIS — F5104 Psychophysiologic insomnia: Secondary | ICD-10-CM

## 2022-12-07 DIAGNOSIS — Z23 Encounter for immunization: Secondary | ICD-10-CM

## 2022-12-07 DIAGNOSIS — Z860100 Personal history of colon polyps, unspecified: Secondary | ICD-10-CM | POA: Diagnosis not present

## 2022-12-07 DIAGNOSIS — Z87442 Personal history of urinary calculi: Secondary | ICD-10-CM | POA: Diagnosis not present

## 2022-12-07 DIAGNOSIS — K2270 Barrett's esophagus without dysplasia: Secondary | ICD-10-CM

## 2022-12-07 DIAGNOSIS — E785 Hyperlipidemia, unspecified: Secondary | ICD-10-CM | POA: Diagnosis not present

## 2022-12-07 DIAGNOSIS — K219 Gastro-esophageal reflux disease without esophagitis: Secondary | ICD-10-CM

## 2022-12-07 DIAGNOSIS — Z0000 Encounter for general adult medical examination without abnormal findings: Secondary | ICD-10-CM

## 2022-12-07 LAB — LIPID PANEL

## 2022-12-07 NOTE — Progress Notes (Signed)
Complete physical exam  Patient: Anna Mckinney   DOB: 1950/08/03   72 y.o. Female  MRN: [id number]  Subjective:    Chief Complaint  Patient presents with   Weight Loss    Pills over injection    Immunizations    Flu    Urinary Frequency    Pressure when she sits. Wakes up during the night.     Anna Mckinney is a 72 y.o. female who presents today for a complete physical exam. She reports consuming a general diet.  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  She generally feels well. She reports sleeping well.  She does use Ambien regularly due to her daughter having Angelman syndrome and her needed to be available for her but be rested in between times.  She does not have additional problems to discuss today.  She does have a history of kidney stones.  She also has a history of polyps but did have a normal colonoscopy in 2020 she does have reflux disease but presently is having no difficulty with that.  Her marriage and home life are going well.  She and her husband take excellent care of their child with Angelman syndrome.  She does have a history of osteoporosis but is not interested in taking any medication.  She also complains of urinary frequency but this seems to be mainly position related.   Most recent fall risk assessment:    12/07/2022   10:58 AM  Fall Risk   Falls in the past year? 0  Number falls in past yr: 0  Injury with Fall? 0     Most recent depression screenings:    12/07/2022   10:58 AM 11/16/2022    3:37 PM  PHQ 2/9 Scores  PHQ - 2 Score 0 0  PHQ- 9 Score  3    Vision:Not within last year  and Dental: No current dental problems and Last dental visit: April 2024    Patient Care Team: Ronnald Nian, MD as PCP - General (Family Medicine) Sharrell Ku, MD as Consulting Physician (Gastroenterology)   Outpatient Medications Prior to Visit  Medication Sig   Ascorbic Acid (VITAMIN C ER PO) Take 1 tablet by mouth daily.    Cholecalciferol (VITAMIN D3) 5000 UNITS TABS Take 1 tablet by mouth daily.   MAGNESIUM PO Take 1 tablet by mouth daily.   Misc Natural Products (ELDERBERRY ZINC/VIT C/IMMUNE MT) Take 1 tablet by mouth daily.   Probiotic Product (PRO-BIOTIC BLEND PO) Take 1 capsule by mouth daily.   zolpidem (AMBIEN) 5 MG tablet TAKE 1 TABLET(5 MG) BY MOUTH AT BEDTIME   acetaminophen (TYLENOL) 500 MG tablet Take 500-1,000 mg by mouth every 6 (six) hours as needed for moderate pain. (Patient not taking: Reported on 12/07/2022)   albuterol (VENTOLIN) 2 MG/5ML syrup Take 7.5 mLs (3 mg total) by mouth 3 (three) times daily. prn (Patient not taking: Reported on 11/16/2022)   azithromycin (ZITHROMAX) 250 MG tablet 2 tablets day 1, then 1 tablet days 2-4 (Patient not taking: Reported on 11/16/2022)   celecoxib (CELEBREX) 200 MG capsule Take 1 capsule (200 mg total) by mouth 2 (two) times daily. (Patient not taking: Reported on 11/02/2022)   esomeprazole (NEXIUM) 40 MG capsule Take 1 capsule (40 mg total) by mouth as needed. (Patient not taking: Reported on 11/02/2022)   No facility-administered medications prior to visit.    Review of Systems  All other systems reviewed and are negative.  Objective:       Physical Exam  Alert and in no distress. Tympanic membranes and canals are normal. Pharyngeal area is normal. Neck is supple without adenopathy or thyromegaly. Cardiac exam shows a regular sinus rhythm without murmurs or gallops. Lungs are clear to auscultation.      Assessment & Plan:    Routine general medical examination at a health care facility - Plan: CBC with Differential/Platelet, Comprehensive metabolic panel, Lipid panel  Psychophysiological insomnia  Hx of colonic polyps  History of renal stone  Gastroesophageal reflux disease without esophagitis  Dyslipidemia  Barrett's esophagus without dysplasia  Immunization History  Administered Date(s) Administered   Fluad Quad(high Dose  65+) 12/25/2018, 12/13/2019, 01/01/2021, 01/01/2022   Influenza Split 01/12/2011, 12/17/2011   Influenza Whole 01/06/2007, 11/27/2007, 11/27/2009   Influenza, High Dose Seasonal PF 12/02/2016, 12/22/2017   Influenza,inj,Quad PF,6+ Mos 12/14/2012, 01/03/2014, 11/18/2014, 12/02/2015   PFIZER(Purple Top)SARS-COV-2 Vaccination 05/16/2019, 06/08/2019   Pneumococcal Conjugate-13 12/02/2016   Pneumococcal Polysaccharide-23 07/09/2005   Td 07/09/2005   Tdap 07/29/2011   Zoster, Live 10/01/2013    Health Maintenance  Topic Date Due   Zoster Vaccines- Shingrix (1 of 2) 08/11/2001   DTaP/Tdap/Td (3 - Td or Tdap) 07/28/2021   INFLUENZA VACCINE  10/07/2022   Medicare Annual Wellness (AWV)  11/16/2023   MAMMOGRAM  05/25/2024   Colonoscopy  01/01/2026   DEXA SCAN  Completed   Hepatitis C Screening  Completed   HPV VACCINES  Aged Out   Pneumonia Vaccine 73+ Years old  Discontinued   COVID-19 Vaccine  Discontinued    Discussed health benefits of physical activity, and encouraged her to engage in regular exercise appropriate for her age and condition.  Also discussed weight loss in regard to making dietary changes and being as physically active as possible.  She was interested in medications and I explained that at this point she does not qualify for any GLP-1.  Problem List Items Addressed This Visit     Barrett's esophagus   Dyslipidemia   GERD (gastroesophageal reflux disease)   History of renal stone   Hx of colonic polyps   Insomnia   Other Visit Diagnoses     Routine general medical examination at a health care facility    -  Primary   Relevant Orders   CBC with Differential/Platelet   Comprehensive metabolic panel   Lipid panel      Discussed diet and exercise with her in detail but at this point do not think she will be able to make any major changes in her lifestyle.  Discussed the fact that she should call me if she think she is having a kidney stone to help mitigate the need  for her to go to the hospital.  I explained that I thought her bladder problem was more in relation to OAB and discussed the possible use of medicine but at this point she is going to wait.  Sharlot Gowda, MD

## 2022-12-07 NOTE — Addendum Note (Signed)
Addended byLawernce Pitts on: 12/07/2022 11:52 AM   Modules accepted: Orders

## 2022-12-08 LAB — LIPID PANEL
Cholesterol, Total: 215 mg/dL — ABNORMAL HIGH (ref 100–199)
HDL: 39 mg/dL — ABNORMAL LOW (ref >39)
LDL CALC COMMENT:: 5.5 ratio — ABNORMAL HIGH (ref 0.0–4.4)
LDL Chol Calc (NIH): 154 mg/dL — ABNORMAL HIGH (ref 0–99)
Triglycerides: 122 mg/dL (ref 0–149)
VLDL Cholesterol Cal: 22 mg/dL (ref 5–40)

## 2022-12-08 LAB — CBC WITH DIFFERENTIAL/PLATELET
Basophils Absolute: 0 10*3/uL (ref 0.0–0.2)
Basos: 1 %
EOS (ABSOLUTE): 0.1 10*3/uL (ref 0.0–0.4)
Eos: 1 %
Hematocrit: 46.9 % — ABNORMAL HIGH (ref 34.0–46.6)
Hemoglobin: 15.6 g/dL (ref 11.1–15.9)
Immature Grans (Abs): 0 10*3/uL (ref 0.0–0.1)
Immature Granulocytes: 0 %
Lymphocytes Absolute: 1.9 10*3/uL (ref 0.7–3.1)
Lymphs: 40 %
MCH: 29.4 pg (ref 26.6–33.0)
MCHC: 33.3 g/dL (ref 31.5–35.7)
MCV: 88 fL (ref 79–97)
Monocytes Absolute: 0.4 10*3/uL (ref 0.1–0.9)
Monocytes: 9 %
Neutrophils Absolute: 2.3 10*3/uL (ref 1.4–7.0)
Neutrophils: 49 %
Platelets: 244 10*3/uL (ref 150–450)
RBC: 5.31 x10E6/uL — ABNORMAL HIGH (ref 3.77–5.28)
RDW: 13.2 % (ref 11.7–15.4)
WBC: 4.7 10*3/uL (ref 3.4–10.8)

## 2022-12-08 LAB — COMPREHENSIVE METABOLIC PANEL
ALT: 16 IU/L (ref 0–32)
AST: 24 IU/L (ref 0–40)
Albumin: 4.3 g/dL (ref 3.8–4.8)
Alkaline Phosphatase: 67 IU/L (ref 44–121)
BUN/Creatinine Ratio: 16 (ref 12–28)
BUN: 11 mg/dL (ref 8–27)
Bilirubin Total: 0.4 mg/dL (ref 0.0–1.2)
CO2: 23 mmol/L (ref 20–29)
Calcium: 10 mg/dL (ref 8.7–10.3)
Chloride: 105 mmol/L (ref 96–106)
Creatinine, Ser: 0.67 mg/dL (ref 0.57–1.00)
Globulin, Total: 2.4 g/dL (ref 1.5–4.5)
Glucose: 106 mg/dL — ABNORMAL HIGH (ref 70–99)
Potassium: 4.5 mmol/L (ref 3.5–5.2)
Sodium: 140 mmol/L (ref 134–144)
Total Protein: 6.7 g/dL (ref 6.0–8.5)
eGFR: 93 mL/min/{1.73_m2} (ref >59)

## 2023-02-11 ENCOUNTER — Telehealth (INDEPENDENT_AMBULATORY_CARE_PROVIDER_SITE_OTHER): Payer: PPO | Admitting: Medical

## 2023-02-11 ENCOUNTER — Encounter: Payer: Self-pay | Admitting: Medical

## 2023-02-11 VITALS — Temp 100.0°F | Wt 147.0 lb

## 2023-02-11 DIAGNOSIS — J329 Chronic sinusitis, unspecified: Secondary | ICD-10-CM | POA: Diagnosis not present

## 2023-02-11 DIAGNOSIS — R110 Nausea: Secondary | ICD-10-CM

## 2023-02-11 DIAGNOSIS — R051 Acute cough: Secondary | ICD-10-CM | POA: Diagnosis not present

## 2023-02-11 DIAGNOSIS — J3489 Other specified disorders of nose and nasal sinuses: Secondary | ICD-10-CM | POA: Diagnosis not present

## 2023-02-11 MED ORDER — PROMETHAZINE-DM 6.25-15 MG/5ML PO SYRP: 5.0000 mL | ORAL_SOLUTION | Freq: Four times a day (QID) | ORAL | 0 refills | Status: DC | PRN

## 2023-02-11 MED ORDER — CEFUROXIME AXETIL 500 MG PO TABS: 500.0000 mg | ORAL_TABLET | Freq: Two times a day (BID) | ORAL | 0 refills | Status: AC

## 2023-02-11 NOTE — Progress Notes (Signed)
Subjective:     Patient ID: Anna Mckinney, female   DOB: [birth year], 72 y.o.   MRN: [id number]  This visit type was conducted due to national recommendations for restrictions regarding the COVID-19 Pandemic (e.g. social distancing) in an effort to limit this patient's exposure and mitigate transmission in our community.  Due to their co-morbid illnesses, this patient is at least at moderate risk for complications without adequate follow up.  This format is felt to be most appropriate for this patient at this time.    Documentation for virtual audio and video telecommunications through [place] encounter:  The patient was located at home. The provider was located in the office. The patient did consent to this visit and is aware of possible charges through their insurance for this visit.  The other persons participating in this telemedicine service were none. Time spent on call was 20 minutes and in review of previous records 20 minutes total.  This virtual service is not related to other E/M service within previous 7 days.   HPI Chief Complaint  Patient presents with   other   Virtual for illness.   She notes several day hx/o symptoms.  She notes lots of congestion, snot is green, sinus pressure, low grade fever.   Coughing.  Nauseated.  No vomiting or diarrhea.   No SOB or wheezing.  Sore throat, feels like sandpaper.  No sick contacts.  No body aches or chills.  Using tylenol and robitussin cough drops.  No other aggravating or relieving factors. No other complaint.  Past Medical History:  Diagnosis Date   Barrett's esophagus     [place] (Dr. Noe Gens)   Blood transfusion without reported diagnosis    Cataract    bilateral-removed   Gastritis    GERD (gastroesophageal reflux disease)    Hemorrhoid    History of hiatal hernia    History of kidney stones    Hyperplastic colon polyp 11/23/2010   Dr Nance Pew   Insomnia    Internal prolapsed hemorrhoids s/p THD  hemorrhoidal ligation/pexy 10/16/2012   Osteopenia    PONV (postoperative nausea and vomiting)    PT'S HEART RATE DOWN TO 40 WITH HER LAST SURGERY - SHE REMEMBERS BEING ASKED TO WAKE UP SO HEART RATE WOULD GO UP - SCARED HER.   Rectal prolapse    Sleep apnea 2015   mild, pt denies 01/02/19   Ulcer of esophagus 10/08/2009   Dr Nance Pew   Current Outpatient Medications on File Prior to Visit  Medication Sig Dispense Refill   acetaminophen (TYLENOL) 500 MG tablet Take 500-1,000 mg by mouth every 6 (six) hours as needed for moderate pain (pain score 4-6).     Ascorbic Acid (VITAMIN C ER PO) Take 1 tablet by mouth daily.     celecoxib (CELEBREX) 200 MG capsule Take 1 capsule (200 mg total) by mouth 2 (two) times daily. 20 capsule 0   Cholecalciferol (VITAMIN D3) 5000 UNITS TABS Take 1 tablet by mouth daily.     esomeprazole (NEXIUM) 40 MG capsule Take 1 capsule (40 mg total) by mouth as needed. 90 capsule 3   MAGNESIUM PO Take 1 tablet by mouth daily.     Misc Natural Products (ELDERBERRY ZINC/VIT C/IMMUNE MT) Take 1 tablet by mouth daily.     Probiotic Product (PRO-BIOTIC BLEND PO) Take 1 capsule by mouth daily.     zolpidem (AMBIEN) 5 MG tablet TAKE 1 TABLET(5 MG) BY MOUTH AT BEDTIME 90 tablet 1  albuterol (VENTOLIN) 2 MG/5ML syrup Take 7.5 mLs (3 mg total) by mouth 3 (three) times daily. prn (Patient not taking: Reported on 11/16/2022) 120 mL 1   No current facility-administered medications on file prior to visit.    Review of Systems As in subjective    Objective:   Physical Exam Due to coronavirus pandemic stay at home measures, patient visit was virtual and they were not examined in person.   Temp 100 F (37.8 C)   Wt 147 lb (66.7 kg)   BMI 26.04 kg/m   Gen: wd, wn, nad Coughing quite a bit No labored breathing      Assessment:     Encounter Diagnoses  Name Primary?   Purulent nasal discharge Yes   Sinusitis, unspecified chronicity, unspecified location    Acute  cough    Nausea        Plan:     We discussed symptoms and concerns.  Advise she continue the counter Robitussin, Tylenol, nasal saline flush, good hydration and rest.  Begin medications below.  If not much improved by early next week within the next 5 days then call or recheck.  If worse over the weekend then go to the hospital  Lauriana was seen today for other.  Diagnoses and all orders for this visit:  Purulent nasal discharge  Sinusitis, unspecified chronicity, unspecified location  Acute cough  Nausea  Other orders -     promethazine-dextromethorphan (PROMETHAZINE-DM) 6.25-15 MG/5ML syrup; Take 5 mLs by mouth 4 (four) times daily as needed for cough. -     cefUROXime (CEFTIN) 500 MG tablet; Take 1 tablet (500 mg total) by mouth 2 (two) times daily with a meal for 10 days.  F/u prn

## 2023-04-27 ENCOUNTER — Encounter: Payer: Self-pay | Admitting: Internal Medicine

## 2023-05-03 ENCOUNTER — Encounter: Payer: Self-pay | Admitting: Internal Medicine

## 2023-05-18 ENCOUNTER — Ambulatory Visit: Payer: Self-pay | Admitting: Family Medicine

## 2023-05-18 NOTE — Telephone Encounter (Signed)
  Chief Complaint: Cough-Productive Symptoms: Cough, Vomiting  Frequency: One Week  Pertinent Negatives: Patient denies chest pain, fever, dyspnea  Disposition: [] ED /[] Urgent Care (no appt availability in office) / [] Appointment(In office/virtual)/ []  [facility]/ [] Home Care/ [] Refused Recommended Disposition /[] [facility]/ [x]  Follow-up with PCP Additional Notes: EM is being triaged for a cough and vomiting. The patient denies a fever and only vomited once. The patient states she is coughing up white phlegm. Contacted the [facility] for further follow up, Shawna at [facility] opted to take control of the call to coordinate getting Anna Mckinney and Anna Mckinney in to be seen by a provider.   Reason for Disposition  [1] Continuous (nonstop) coughing interferes with work or school AND [2] no improvement using cough treatment per Care Advice  Answer Assessment - Initial Assessment Questions 1. ONSET: "When did the cough begin?"      One Week  2. SEVERITY: "How bad is the cough today?"      Constant  3. SPUTUM: "Describe the color of your sputum" (none, dry cough; clear, white, yellow, green)     White  4. HEMOPTYSIS: "Are you coughing up any blood?" If so ask: "How much?" (flecks, streaks, tablespoons, etc.)     None  5. DIFFICULTY BREATHING: "Are you having difficulty breathing?" If Yes, ask: "How bad is it?" (e.g., mild, moderate, severe)    - MILD: No SOB at rest, mild SOB with walking, speaks normally in sentences, can lie down, no retractions, pulse < 100.    - MODERATE: SOB at rest, SOB with minimal exertion and prefers to sit, cannot lie down flat, speaks in phrases, mild retractions, audible wheezing, pulse 100-120.    - SEVERE: Very SOB at rest, speaks in single words, struggling to breathe, sitting hunched forward, retractions, pulse > 120      None  6. FEVER: "Do you have a fever?" If Yes, ask: "What is your temperature, how was it measured,  and when did it start?"     Not measured  7. CARDIAC HISTORY: "Do you have any history of heart disease?" (e.g., heart attack, congestive heart failure)       No  8. LUNG HISTORY: "Do you have any history of lung disease?"  (e.g., pulmonary embolus, asthma, emphysema)     No  9. PE RISK FACTORS: "Do you have a history of blood clots?" (or: recent major surgery, recent prolonged travel, bedridden)     No  10. OTHER SYMPTOMS: "Do you have any other symptoms?" (e.g., runny nose, wheezing, chest pain)       Vomiting, Congestion, Loss of appetite  11. PREGNANCY: "Is there any chance you are pregnant?" "When was your last menstrual period?"       No and No  12. TRAVEL: "Have you traveled out of the country in the last month?" (e.g., travel history, exposures)       No  Protocols used: Cough - Acute Productive-A-AH

## 2023-05-18 NOTE — Telephone Encounter (Signed)
 Please schedule appt

## 2023-05-19 ENCOUNTER — Ambulatory Visit: Admitting: Medical

## 2023-05-19 VITALS — BP 120/84 | HR 80 | Temp 98.9°F | Resp 16 | Wt 144.6 lb

## 2023-05-19 DIAGNOSIS — J988 Other specified respiratory disorders: Secondary | ICD-10-CM | POA: Diagnosis not present

## 2023-05-19 DIAGNOSIS — R058 Other specified cough: Secondary | ICD-10-CM | POA: Diagnosis not present

## 2023-05-19 DIAGNOSIS — R051 Acute cough: Secondary | ICD-10-CM | POA: Diagnosis not present

## 2023-05-19 DIAGNOSIS — R918 Other nonspecific abnormal finding of lung field: Secondary | ICD-10-CM | POA: Diagnosis not present

## 2023-05-19 MED ORDER — ALBUTEROL SULFATE 2 MG/5ML PO SYRP: 3.0000 mg | ORAL_SOLUTION | Freq: Two times a day (BID) | ORAL | 0 refills | Status: DC

## 2023-05-19 MED ORDER — CEFUROXIME AXETIL 500 MG PO TABS: 500.0000 mg | ORAL_TABLET | Freq: Two times a day (BID) | ORAL | 0 refills | Status: AC

## 2023-05-19 MED ORDER — BENZONATATE 200 MG PO CAPS: 200.0000 mg | ORAL_CAPSULE | Freq: Three times a day (TID) | ORAL | 0 refills | Status: DC | PRN

## 2023-05-19 NOTE — Progress Notes (Signed)
 Subjective:  Scout Guyett is a 73 y.o. female who presents for Chief Complaint  Patient presents with   Cough    Cough and congestion fever since last Thursday     Here for cough and illness for about a week.  She notes cough, congestion, had some fever last week but that resolved.  Fever up to 102 last week.   Has had some flushing but no chills.  No sore throat, no head pressure.  Has been getting a lot of mucous up,  coughing a lot.  Hydrating well.  No ear pain.  Sometimes lying down gets some wheezing.  Using some robitussin DM.   No other aggravating or relieving factors.    No other c/o.  Past Medical History:  Diagnosis Date   Barrett's esophagus     [place] (Dr. Noe Gens)   Blood transfusion without reported diagnosis    Cataract    bilateral-removed   Gastritis    GERD (gastroesophageal reflux disease)    Hemorrhoid    History of hiatal hernia    History of kidney stones    Hyperplastic colon polyp 11/23/2010   Dr Nance Pew   Insomnia    Internal prolapsed hemorrhoids s/p THD hemorrhoidal ligation/pexy 10/16/2012   Osteopenia    PONV (postoperative nausea and vomiting)    PT'S HEART RATE DOWN TO 40 WITH HER LAST SURGERY - SHE REMEMBERS BEING ASKED TO WAKE UP SO HEART RATE WOULD GO UP - SCARED HER.   Rectal prolapse    Sleep apnea 2015   mild, pt denies 01/02/19   Ulcer of esophagus 10/08/2009   Dr Nance Pew   Current Outpatient Medications on File Prior to Visit  Medication Sig Dispense Refill   Ascorbic Acid (VITAMIN C ER PO) Take 1 tablet by mouth daily.     MAGNESIUM PO Take 1 tablet by mouth daily.     Misc Natural Products (ELDERBERRY ZINC/VIT C/IMMUNE MT) Take 1 tablet by mouth daily.     Probiotic Product (PRO-BIOTIC BLEND PO) Take 1 capsule by mouth daily.     zolpidem (AMBIEN) 5 MG tablet TAKE 1 TABLET(5 MG) BY MOUTH AT BEDTIME 90 tablet 1   acetaminophen (TYLENOL) 500 MG tablet Take 500-1,000 mg by mouth every 6 (six) hours as needed for  moderate pain (pain score 4-6). (Patient not taking: Reported on 05/19/2023)     esomeprazole (NEXIUM) 40 MG capsule Take 1 capsule (40 mg total) by mouth as needed. 90 capsule 3   No current facility-administered medications on file prior to visit.    The following portions of the patient's history were reviewed and updated as appropriate: allergies, current medications, past family history, past medical history, past social history, past surgical history and problem list.  ROS Otherwise as in subjective above  Objective: BP 120/84   Pulse 80   Temp 98.9 F (37.2 C)   Resp 16   Wt 144 lb 9.6 oz (65.6 kg)   SpO2 98%   BMI 25.61 kg/m   General appearance: alert, no distress, well developed, well nourished HEENT: normocephalic, sclerae anicteric, conjunctiva pink and moist, TMs pearly, nares with erythema, mucoid discharge, pharynx normal Oral cavity: MMM, no lesions Neck: supple, no lymphadenopathy, no thyromegaly, no masses Heart: RRR, normal S1, S2, no murmurs Lungs: Bronchial breath sounds, scattered rhonchi, no wheezes, no rales Pulses: 2+ radial pulses, 2+ pedal pulses, normal cap refill Ext: no edema   Assessment: Encounter Diagnoses  Name Primary?   Acute  cough Yes   Respiratory tract infection    Abnormal lung field    Cough productive of purulent sputum      Plan: Your symptoms suggest some bronchitis inflammation and some mucus in the lungs  Recommendations: Drink least 60 to 80 ounces water daily which will help get out some of the mucus Begin Ceftin antibiotic twice daily for 7 to 10 days You can use Tessalon Perle cough drops as needed I also sent some albuterol liquid.  Since you do not tolerate steroids very well such as prednisone, I think the albuterol did not help open up some of the air trapping and get out some of the mucus to help clear this up.  Consider doing the albuterol liquid twice daily for the next several days I would also do a little bit  of over-the-counter Mucinex or allergy pill.  If you have more runny nose, drainage down the back of throat or watery nose then use allergy pill such as Benadryl or Zyrtec for the next several nights.  If you have more thick mucus that seems trapped then try some plain Mucinex twice daily for the next several days You can use Tylenol as needed for fever not feeling well  Pete was seen today for cough.  Diagnoses and all orders for this visit:  Acute cough  Respiratory tract infection  Abnormal lung field  Cough productive of purulent sputum  Other orders -     benzonatate (TESSALON) 200 MG capsule; Take 1 capsule (200 mg total) by mouth 3 (three) times daily as needed for cough. -     cefUROXime (CEFTIN) 500 MG tablet; Take 1 tablet (500 mg total) by mouth 2 (two) times daily with a meal for 10 days. -     albuterol (VENTOLIN) 2 MG/5ML syrup; Take 7.5 mLs (3 mg total) by mouth in the morning and at bedtime. prn    Follow up: prn

## 2023-05-19 NOTE — Patient Instructions (Signed)
 Your symptoms suggest some bronchitis inflammation and some mucus in the lungs  Recommendations: Drink least 60 to 80 ounces water daily which will help get out some of the mucus Begin Ceftin antibiotic twice daily for 7 to 10 days You can use Tessalon Perle cough drops as needed I also sent some albuterol liquid.  Since you do not tolerate steroids very well such as prednisone, I think the albuterol did not help open up some of the air trapping and get out some of the mucus to help clear this up.  Consider doing the albuterol liquid twice daily for the next several days I would also do a little bit of over-the-counter Mucinex or allergy pill.  If you have more runny nose, drainage down the back of throat or watery nose then use allergy pill such as Benadryl or Zyrtec for the next several nights.  If you have more thick mucus that seems trapped then try some plain Mucinex twice daily for the next several days You can use Tylenol as needed for fever not feeling well

## 2023-05-20 ENCOUNTER — Other Ambulatory Visit: Payer: Self-pay | Admitting: Family Medicine

## 2023-05-20 DIAGNOSIS — G4700 Insomnia, unspecified: Secondary | ICD-10-CM

## 2023-05-20 NOTE — Telephone Encounter (Signed)
 Last apt 12/07/22

## 2023-05-23 ENCOUNTER — Other Ambulatory Visit: Payer: Self-pay | Admitting: Medical

## 2023-05-23 ENCOUNTER — Ambulatory Visit: Payer: Self-pay | Admitting: Family Medicine

## 2023-05-23 MED ORDER — PREDNISONE 20 MG PO TABS: ORAL_TABLET | ORAL | 0 refills | Status: DC

## 2023-05-23 MED ORDER — PROMETHAZINE-DM 6.25-15 MG/5ML PO SYRP: 5.0000 mL | ORAL_SOLUTION | Freq: Four times a day (QID) | ORAL | 0 refills | Status: DC | PRN

## 2023-05-23 NOTE — Telephone Encounter (Signed)
 Chief Complaint: persistent, dry cough seen on 05/19/23 Symptoms: cough Frequency: ongoing x 1-2 weeks Pertinent Negatives: Patient denies SOB, hemoptysis. Disposition: [] ED /[] Urgent Care (no appt availability in office) / [] Appointment(In office/virtual)/ []  [facility]/ [] Home Care/ [] Refused Recommended Disposition /[] [facility]/ [x]  Follow-up with PCP Additional Notes: Patient requesting the steroid that was discussed at last office visit 05/19/23. She states in the past steroids caused her to be shaky but she is willing to try them to help with her cough. Patient has been taking Ceftin, Tylenol, liquid Albuterol, honey and Muccinex. She states the cough feels like it is stuck. She states the provider, Tysinger PA, told her to call back if the cough did not improve and he would send in the steroid. She states she has been checking her pulse oximeter and states he oxygen levels but her HR elevates when coughing. Called [facility] and spoke with staff regarding patient request, state CRM message has already been routed.  Copied from CRM [id number]. Topic: Clinical - Medication Question >> May 23, 2023  2:25 PM Nyra Capes wrote: Reason for CRM:  Patient was seen on Thursday, was given new medication and advised to call in if cough is not working. She would like to try a steroid  medication that Dr. Susann Givens suggested     Patient ph# [phone] Reason for Disposition  [1] Continuous (nonstop) coughing interferes with work or school AND [2] no improvement using cough treatment per Care Advice  Answer Assessment - Initial Assessment Questions 1. ONSET: "When did the cough begin?"      1-2 weeks.  2. SEVERITY: "How bad is the cough today?"      Lingering, dry constant cough.  3. SPUTUM: "Describe the color of your sputum" (none, dry cough; clear, white, yellow, green)     Dry.  4. HEMOPTYSIS: "Are you coughing up any blood?" If so ask: "How much?" (flecks, streaks, tablespoons,  etc.)     Denies.  5. DIFFICULTY BREATHING: "Are you having difficulty breathing?" If Yes, ask: "How bad is it?" (e.g., mild, moderate, severe)    - MILD: No SOB at rest, mild SOB with walking, speaks normally in sentences, can lie down, no retractions, pulse < 100.    - MODERATE: SOB at rest, SOB with minimal exertion and prefers to sit, cannot lie down flat, speaks in phrases, mild retractions, audible wheezing, pulse 100-120.    - SEVERE: Very SOB at rest, speaks in single words, struggling to breathe, sitting hunched forward, retractions, pulse > 120      Denies.  6. FEVER: "Do you have a fever?" If Yes, ask: "What is your temperature, how was it measured, and when did it start?"     No fevers since office visit on 05/19/23.  7. CARDIAC HISTORY: "Do you have any history of heart disease?" (e.g., heart attack, congestive heart failure)      Denies.  8. LUNG HISTORY: "Do you have any history of lung disease?"  (e.g., pulmonary embolus, asthma, emphysema)     Denies.  9. PE RISK FACTORS: "Do you have a history of blood clots?" (or: recent major surgery, recent prolonged travel, bedridden)     Denies.  10. OTHER SYMPTOMS: "Do you have any other symptoms?" (e.g., runny nose, wheezing, chest pain)       Denies.  11. PREGNANCY: "Is there any chance you are pregnant?" "When was your last menstrual period?"       N/A.  12. TRAVEL: "Have you  traveled out of the country in the last month?" (e.g., travel history, exposures)       N/A.  Protocols used: Cough - Acute Non-Productive-A-AH

## 2023-05-23 NOTE — Telephone Encounter (Signed)
 Pt was notified.

## 2023-08-03 ENCOUNTER — Ambulatory Visit (INDEPENDENT_AMBULATORY_CARE_PROVIDER_SITE_OTHER): Admitting: Family Medicine

## 2023-08-03 ENCOUNTER — Encounter: Payer: Self-pay | Admitting: Family Medicine

## 2023-08-03 VITALS — BP 138/70 | HR 65 | Wt 145.4 lb

## 2023-08-03 DIAGNOSIS — R030 Elevated blood-pressure reading, without diagnosis of hypertension: Secondary | ICD-10-CM

## 2023-08-03 NOTE — Progress Notes (Signed)
   Subjective:    Patient ID: Anna Mckinney, female    DOB: [birth year], 73 y.o.   MRN: [id number]  HPI She states that last Thursday she was not feeling well with some slight chest tightness and twitching and checked her blood pressure which was 189/90.  She continue to monitor this and it did come down slightly.  She has concerns on what to do about the blood pressure.   Review of Systems     Objective:    Physical Exam Alert and in no distress.  Blood pressure is recorded.       Assessment & Plan:  Elevated blood pressure reading I explained that her blood pressure has returned to essentially normal and at this point no further intervention is necessary.  Explained the fact that the blood pressure can fluctuate and we would not make any changes unless it was a sustained elevated blood pressure for an extended period of time.  She was comfortable with that.

## 2023-11-15 ENCOUNTER — Other Ambulatory Visit: Payer: Self-pay | Admitting: Family Medicine

## 2023-11-15 DIAGNOSIS — G4700 Insomnia, unspecified: Secondary | ICD-10-CM

## 2023-11-22 ENCOUNTER — Ambulatory Visit (INDEPENDENT_AMBULATORY_CARE_PROVIDER_SITE_OTHER): Payer: PPO

## 2023-11-22 DIAGNOSIS — Z0000 Encounter for general adult medical examination without abnormal findings: Secondary | ICD-10-CM

## 2023-11-22 NOTE — Patient Instructions (Signed)
 [name],  Thank you for taking the time for your Medicare Wellness Visit. I appreciate your continued commitment to your health goals. Please review the care plan we discussed, and feel free to reach out if I can assist you further.  Medicare recommends these wellness visits once per year to help you and your care team stay ahead of potential health issues. These visits are designed to focus on prevention, allowing your provider to concentrate on managing your acute and chronic conditions during your regular appointments.  Please note that Annual Wellness Visits do not include a physical exam. Some assessments may be limited, especially if the visit was conducted virtually. If needed, we may recommend a separate in-person follow-up with your provider.  Ongoing Care Seeing your primary care provider every 3 to 6 months helps us  monitor your health and provide consistent, personalized care.   Referrals If a referral was made during today's visit and you haven't received any updates within two weeks, please contact the referred provider directly to check on the status.  Recommended Screenings:  Health Maintenance  Topic Date Due   Zoster (Shingles) Vaccine (1 of 2) 08/11/2001   DTaP/Tdap/Td vaccine (3 - Td or Tdap) 07/28/2021   Flu Shot  10/07/2023   Breast Cancer Screening  05/25/2024   Medicare Annual Wellness Visit  11/21/2024   Colon Cancer Screening  01/01/2026   DEXA scan (bone density measurement)  Completed   Hepatitis C Screening  Completed   HPV Vaccine  Aged Out   Meningitis B Vaccine  Aged Out   Pneumococcal Vaccine for age over 21  Discontinued   COVID-19 Vaccine  Discontinued       11/22/2023    3:00 PM  Advanced Directives  Does Patient Have a Medical Advance Directive? No   Advance Care Planning is important because it: Ensures you receive medical care that aligns with your values, goals, and preferences. Provides guidance to your family and loved ones, reducing  the emotional burden of decision-making during critical moments.  Vision: Annual vision screenings are recommended for early detection of glaucoma, cataracts, and diabetic retinopathy. These exams can also reveal signs of chronic conditions such as diabetes and high blood pressure.  Dental: Annual dental screenings help detect early signs of oral cancer, gum disease, and other conditions linked to overall health, including heart disease and diabetes.  Please see the attached documents for additional preventive care recommendations.

## 2023-11-22 NOTE — Progress Notes (Signed)
 Subjective:   Anna Mckinney is a 73 y.o. who presents for a Medicare Wellness preventive visit.  As a reminder, Annual Wellness Visits don't include a physical exam, and some assessments may be limited, especially if this visit is performed virtually. We may recommend an in-person follow-up visit with your provider if needed.  Visit Complete: Virtual I connected with  Anna Mckinney on 11/22/23 by a audio enabled telemedicine application and verified that I am speaking with the correct person using two identifiers.  Patient Location: Home  Provider Location: Office/Clinic  I discussed the limitations of evaluation and management by telemedicine. The patient expressed understanding and agreed to proceed.  Vital Signs: Because this visit was a virtual/telehealth visit, some criteria may be missing or patient reported. Any vitals not documented were not able to be obtained and vitals that have been documented are patient reported.  VideoError- Librarian, academic were attempted between this provider and patient, however failed, due to patient having technical difficulties OR patient did not have access to video capability.  We continued and completed visit with audio only.   Persons Participating in Visit: Patient.  AWV Questionnaire: No: Patient Medicare AWV questionnaire was not completed prior to this visit.  Cardiac Risk Factors include: advanced age (>5men, >63 women);dyslipidemia     Objective:    Today's Vitals   There is no height or weight on file to calculate BMI.     11/22/2023    3:00 PM 11/16/2022    3:35 PM 05/25/2022    1:08 PM 05/07/2022    8:49 AM 04/24/2022    3:11 PM 02/04/2020    2:13 PM 12/02/2016    9:55 AM  Advanced Directives  Does Patient Have a Medical Advance Directive? No No No No No No No   Would patient like information on creating a medical advance directive?    No - Patient declined  Yes (ED - Information  included in AVS) Yes (MAU/Ambulatory/Procedural Areas - Information given)      Data saved with a previous flowsheet row definition    Current Medications (verified) Outpatient Encounter Medications as of 11/22/2023  Medication Sig   acetaminophen  (TYLENOL ) 500 MG tablet Take 500-1,000 mg by mouth every 6 (six) hours as needed for moderate pain (pain score 4-6).   Ascorbic Acid (VITAMIN C ER PO) Take 1 tablet by mouth daily.   esomeprazole  (NEXIUM ) 40 MG capsule Take 1 capsule (40 mg total) by mouth as needed.   MAGNESIUM  PO Take 1 tablet by mouth daily.   Misc Natural Products (ELDERBERRY ZINC/VIT C/IMMUNE MT) Take 1 tablet by mouth daily.   Probiotic Product (PRO-BIOTIC BLEND PO) Take 1 capsule by mouth daily.   promethazine -dextromethorphan (PROMETHAZINE -DM) 6.25-15 MG/5ML syrup Take 5 mLs by mouth 4 (four) times daily as needed for cough. (Patient not taking: Reported on 11/22/2023)   zolpidem  (AMBIEN ) 5 MG tablet TAKE 1 TABLET(5 MG) BY MOUTH AT BEDTIME   albuterol  (VENTOLIN ) 2 MG/5ML syrup Take 7.5 mLs (3 mg total) by mouth in the morning and at bedtime. prn (Patient not taking: Reported on 11/22/2023)   benzonatate  (TESSALON ) 200 MG capsule Take 1 capsule (200 mg total) by mouth 3 (three) times daily as needed for cough. (Patient not taking: Reported on 11/22/2023)   predniSONE  (DELTASONE ) 20 MG tablet 3 tablets today, 2 tablets tomorrow, 1 tablet the third day (Patient not taking: Reported on 11/22/2023)   No facility-administered encounter medications on file as of 11/22/2023.  Allergies (verified) Hydrocodone, Codeine, Doxycycline, and Oxycodone  hcl   History: Past Medical History:  Diagnosis Date   Barrett's esophagus     [place] (Dr. Zulema)   Blood transfusion without reported diagnosis    Cataract    bilateral-removed   Gastritis    GERD (gastroesophageal reflux disease)    Hemorrhoid    History of hiatal hernia    History of kidney stones    Hyperplastic colon  polyp 11/23/2010   Dr Lorel Zulema   Insomnia    Internal prolapsed hemorrhoids s/p THD hemorrhoidal ligation/pexy 10/16/2012   Osteopenia    PONV (postoperative nausea and vomiting)    PT'S HEART RATE DOWN TO 40 WITH HER LAST SURGERY - SHE REMEMBERS BEING ASKED TO WAKE UP SO HEART RATE WOULD GO UP - SCARED HER.   Rectal prolapse    Sleep apnea 2015   mild, pt denies 01/02/19   Ulcer of esophagus 10/08/2009   Dr lorel Zulema   Past Surgical History:  Procedure Laterality Date   ABDOMINAL HYSTERECTOMY  1984   1 ovary remains; removed for cancer cells   CATARACT EXTRACTION Bilateral end of 2015 & early 2016   Dr. Meridee   CHOLECYSTECTOMY  2010   COLONOSCOPY     COLONOSCOPY W/ BIOPSIES     CYSTOSCOPY/URETEROSCOPY/HOLMIUM LASER/STENT PLACEMENT Left 05/11/2022   Procedure: CYSTOSCOPY, LEFT URETEROSCOPY LEFT RETROGRADE PYELOGRAM, AND LEFT URETERAL STENT PLACEMENT;  Surgeon: Elisabeth Valli BIRCH, MD;  Location: [facility];  Service: Urology;  Laterality: Left;  60 MINUTES   CYSTOSCOPY/URETEROSCOPY/HOLMIUM LASER/STENT PLACEMENT Left 05/25/2022   Procedure: CYSTOSCOPY, LEFT URETEROSCOPY, HOLMIUM LASER LITHOTRIPSY, LEFT URETERAL STENT EXCHANGE;  Surgeon: Elisabeth Valli BIRCH, MD;  Location: [facility];  Service: Urology;  Laterality: Left;   ESOPHAGOGASTRODUODENOSCOPY     EVALUATION UNDER ANESTHESIA WITH HEMORRHOIDECTOMY N/A 01/25/2013   Procedure: EXAM UNDER ANESTHESIA WITH HEMORRHOIDECTOMY;  Surgeon: Elspeth KYM Schultze, MD;  Location: [facility];  Service: General;  Laterality: N/A;   posterolateral external hemorrhoidactomy  10/20/2012   left   SKIN BIOPSY Left    see chart.  Skin biopsy rontal let scalp, pt denies   TRANSANAL HEMORRHOIDAL DEARTERIALIZATION  10/20/2012   hemorrhoidal ligation and pexy.   Family History  Problem Relation Age of Onset   Breast cancer Mother 5   Cancer Mother        breast cancer   COPD Father    Heart disease Father        CHF   Atrial fibrillation Father     Esophageal cancer Father    Angelman syndrome Daughter    Seizures Daughter    Thyroid  disease Sister    Diabetes Sister        diet controlled   Arthritis Sister        rheumatoid   Hyperlipidemia Brother    Colon cancer Sister 48   Cancer Paternal Grandmother        stomach   Stomach cancer Paternal Grandmother    Social History   Socioeconomic History   Marital status: Married    Spouse name: Not on file   Number of children: 2   Years of education: Not on file   Highest education level: Not on file  Occupational History   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  Tobacco Use   Smoking status: Former    Current packs/day: 0.00    Average packs/day: 0.3 packs/day for 20.0 years (6.0 ttl pk-yrs)    Types: Cigarettes    Start  date: 01/24/1989    Quit date: 01/24/2009    Years since quitting: 14.8   Smokeless tobacco: Never  Vaping Use   Vaping status: Never Used  Substance and Sexual Activity   Alcohol use: No    Alcohol/week: 0.0 standard drinks of alcohol   Drug use: No   Sexual activity: Not Currently  Other Topics Concern   Not on file  Social History Narrative   Lives with husband, daughter (with Angelman Hank is her caregiver) and 1 dog (boxer)   Social Drivers of Corporate investment banker Strain: Low Risk  (11/22/2023)   Overall Financial Resource Strain (CARDIA)    Difficulty of Paying Living Expenses: Not hard at all  Food Insecurity: No Food Insecurity (11/22/2023)   Hunger Vital Sign    Worried About Running Out of Food in the Last Year: Never true    Ran Out of Food in the Last Year: Never true  Transportation Needs: No Transportation Needs (11/22/2023)   PRAPARE - Administrator, Civil Service (Medical): No    Lack of Transportation (Non-Medical): No  Physical Activity: Inactive (11/22/2023)   Exercise Vital Sign    Days of Exercise per Week: 0 days    Minutes of Exercise per Session: 0 min  Stress: No Stress Concern Present  (11/22/2023)   [employer] of Occupational Health - Occupational Stress Questionnaire    Feeling of Stress: Not at all  Social Connections: Moderately Integrated (11/22/2023)   Social Connection and Isolation Panel    Frequency of Communication with Friends and Family: More than three times a week    Frequency of Social Gatherings with Friends and Family: Once a week    Attends Religious Services: More than 4 times per year    Active Member of [employer] or Organizations: No    Attends Banker Meetings: Never    Marital Status: Married    Tobacco Counseling Counseling given: Not Answered    Clinical Intake:  Pre-visit preparation completed: Yes  Pain : No/denies pain     Nutritional Risks: None Diabetes: No  Lab Results  Component Value Date   HGBA1C 5.2 12/02/2016     How often do you need to have someone help you when you read instructions, pamphlets, or other written materials from your doctor or pharmacy?: 1 - Never  Interpreter Needed?: No  Information entered by :: NAllen LPN   Activities of Daily Living     11/22/2023    2:53 PM  In your present state of health, do you have any difficulty performing the following activities:  Hearing? 0  Vision? 1  Comment trouble with right eye scar tissue  Difficulty concentrating or making decisions? 0  Walking or climbing stairs? 0  Dressing or bathing? 0  Doing errands, shopping? 0  Preparing Food and eating ? N  Using the Toilet? N  In the past six months, have you accidently leaked urine? N  Do you have problems with loss of bowel control? N  Managing your Medications? N  Managing your Finances? N  Housekeeping or managing your Housekeeping? N    Patient Care Team: Joyce Norleen BROCKS, MD as PCP - General (Family Medicine) Luis Purchase, MD as Consulting Physician (Gastroenterology)  I have updated your Care Teams any recent Medical Services you may have received from other providers in the past  year.     Assessment:   This is a routine wellness examination for Anna Mckinney.  Hearing/Vision screen  Hearing Screening - Comments:: Denies hearing issues Vision Screening - Comments:: Regular eye exams, Dr. Meridee   Goals Addressed             This Visit's Progress    Patient Stated       11/22/2023, wants to lose weight, eating healthier       Depression Screen     11/22/2023    3:04 PM 12/07/2022   10:58 AM 11/16/2022    3:37 PM 11/26/2021   10:59 AM 02/13/2021    9:02 AM 02/04/2020    2:14 PM 12/05/2017    1:42 PM  PHQ 2/9 Scores  PHQ - 2 Score 0 0 0 0 0 0 0  PHQ- 9 Score 3  3        Fall Risk     11/22/2023    3:01 PM 12/07/2022   10:58 AM 11/16/2022    3:36 PM 02/13/2021    9:02 AM 02/04/2020    2:13 PM  Fall Risk   Falls in the past year? 0 0 0 0 0  Number falls in past yr: 0 0 0 0   Injury with Fall? 0 0 0 0   Risk for fall due to : Medication side effect  Medication side effect No Fall Risks   Follow up Falls evaluation completed;Falls prevention discussed  Falls prevention discussed;Falls evaluation completed Falls evaluation completed       Data saved with a previous flowsheet row definition    MEDICARE RISK AT HOME:  Medicare Risk at Home Any stairs in or around the home?: Yes (ramp) If so, are there any without handrails?: No Home free of loose throw rugs in walkways, pet beds, electrical cords, etc?: Yes Adequate lighting in your home to reduce risk of falls?: Yes Life alert?: No Use of a cane, walker or w/c?: No Grab bars in the bathroom?: Yes Shower chair or bench in shower?: Yes Elevated toilet seat or a handicapped toilet?: Yes  TIMED UP AND GO:  Was the test performed?  No  Cognitive Function: 6CIT completed        11/22/2023    3:06 PM 11/16/2022    3:39 PM  6CIT Screen  What Year? 0 points 0 points  What month? 0 points 0 points  What time? 0 points 0 points  Count back from 20 0 points 0 points  Months in reverse 0 points 0  points  Repeat phrase 0 points 0 points  Total Score 0 points 0 points    Immunizations Immunization History  Administered Date(s) Administered   Fluad Quad(high Dose 65+) 12/25/2018, 12/13/2019, 01/01/2021, 01/01/2022   Fluad Trivalent(High Dose 65+) 12/07/2022   INFLUENZA, HIGH DOSE SEASONAL PF 12/02/2016, 12/22/2017   Influenza Split 01/12/2011, 12/17/2011   Influenza Whole 01/06/2007, 11/27/2007, 11/27/2009   Influenza,inj,Quad PF,6+ Mos 12/14/2012, 01/03/2014, 11/18/2014, 12/02/2015   PFIZER(Purple Top)SARS-COV-2 Vaccination 05/16/2019, 06/08/2019   Pneumococcal Conjugate-13 12/02/2016   Pneumococcal Polysaccharide-23 07/09/2005   Td 07/09/2005   Tdap 07/29/2011   Zoster, Live 10/01/2013    Screening Tests Health Maintenance  Topic Date Due   Zoster Vaccines- Shingrix (1 of 2) 08/11/2001   DTaP/Tdap/Td (3 - Td or Tdap) 07/28/2021   Influenza Vaccine  10/07/2023   Mammogram  05/25/2024   Medicare Annual Wellness (AWV)  11/21/2024   Colonoscopy  01/01/2026   DEXA SCAN  Completed   Hepatitis C Screening  Completed   HPV VACCINES  Aged Out   Meningococcal B Vaccine  Aged  Out   Pneumococcal Vaccine: 50+ Years  Discontinued   COVID-19 Vaccine  Discontinued    Health Maintenance Items Addressed: Due for TDAP, flu and shingles vaccines.  Additional Screening:  Vision Screening: Recommended annual ophthalmology exams for early detection of glaucoma and other disorders of the eye. Is the patient up to date with their annual eye exam?  Yes  Who is the provider or what is the name of the office in which the patient attends annual eye exams? Dr. Meridee  Dental Screening: Recommended annual dental exams for proper oral hygiene  Community Resource Referral / Chronic Care Management: CRR required this visit?  No   CCM required this visit?  No   Plan:    I have personally reviewed and noted the following in the patient's chart:   Medical and social history Use  of alcohol, tobacco or illicit drugs  Current medications and supplements including opioid prescriptions. Patient is not currently taking opioid prescriptions. Functional ability and status Nutritional status Physical activity Advanced directives List of other physicians Hospitalizations, surgeries, and ER visits in previous 12 months Vitals Screenings to include cognitive, depression, and falls Referrals and appointments  In addition, I have reviewed and discussed with patient certain preventive protocols, quality metrics, and best practice recommendations. A written personalized care plan for preventive services as well as general preventive health recommendations were provided to patient.   Ardella FORBES Dawn, LPN   [id number]   After Visit Summary: (Pick Up) Due to this being a telephonic visit, with patients personalized plan was offered to patient and patient has requested to Pick up at office.  Notes: Nothing significant to report at this time.

## 2023-11-28 ENCOUNTER — Ambulatory Visit (INDEPENDENT_AMBULATORY_CARE_PROVIDER_SITE_OTHER): Admitting: Medical

## 2023-11-28 VITALS — BP 124/72 | HR 68 | Temp 99.7°F | Wt 145.0 lb

## 2023-11-28 DIAGNOSIS — U071 COVID-19: Secondary | ICD-10-CM | POA: Diagnosis not present

## 2023-11-28 DIAGNOSIS — R509 Fever, unspecified: Secondary | ICD-10-CM | POA: Diagnosis not present

## 2023-11-28 DIAGNOSIS — R051 Acute cough: Secondary | ICD-10-CM

## 2023-11-28 LAB — POC COVID19 BINAXNOW: SARS Coronavirus 2 Ag: POSITIVE — AB

## 2023-11-28 LAB — POCT INFLUENZA A/B
Influenza A, POC: NEGATIVE
Influenza B, POC: NEGATIVE

## 2023-11-28 MED ORDER — PAXLOVID (300/100) 20 X 150 MG & 10 X 100MG PO TBPK: 3.0000 | ORAL_TABLET | Freq: Two times a day (BID) | ORAL | 0 refills | Status: AC

## 2023-11-28 MED ORDER — PROMETHAZINE-DM 6.25-15 MG/5ML PO SYRP: 5.0000 mL | ORAL_SOLUTION | Freq: Four times a day (QID) | ORAL | 0 refills | Status: DC | PRN

## 2023-11-28 NOTE — Progress Notes (Signed)
 Subjective: Chief Complaint  Patient presents with   Acute Visit    Congestion, sore throat, diarrhea,    Here for 1 day history of subjective fever, sore throat, fatigue, body aches, some loose stool, cough.  No nausea, vomiting, shortness of breath, wheezing.  Her daughter has the same symptoms.  They are both being seen today. No other aggravating or relieving factors.  No other c/o.  The following portions of the patient's history were reviewed and updated as appropriate: allergies, current medications, past medical history, past social history and problem list.  ROS as in subjective   Past Medical History:  Diagnosis Date   Barrett's esophagus     [place] (Dr. Zulema)   Blood transfusion without reported diagnosis    Cataract    bilateral-removed   Gastritis    GERD (gastroesophageal reflux disease)    Hemorrhoid    History of hiatal hernia    History of kidney stones    Hyperplastic colon polyp 11/23/2010   Dr Lorel Zulema   Insomnia    Internal prolapsed hemorrhoids s/p THD hemorrhoidal ligation/pexy 10/16/2012   Osteopenia    PONV (postoperative nausea and vomiting)    PT'S HEART RATE DOWN TO 40 WITH HER LAST SURGERY - SHE REMEMBERS BEING ASKED TO WAKE UP SO HEART RATE WOULD GO UP - SCARED HER.   Rectal prolapse    Sleep apnea 2015   mild, pt denies 01/02/19   Ulcer of esophagus 10/08/2009   Dr lorel Zulema   Current Outpatient Medications on File Prior to Visit  Medication Sig Dispense Refill   acetaminophen  (TYLENOL ) 500 MG tablet Take 500-1,000 mg by mouth every 6 (six) hours as needed for moderate pain (pain score 4-6).     Ascorbic Acid (VITAMIN C ER PO) Take 1 tablet by mouth daily.     esomeprazole  (NEXIUM ) 40 MG capsule Take 1 capsule (40 mg total) by mouth as needed. 90 capsule 3   MAGNESIUM  PO Take 1 tablet by mouth daily.     Probiotic Product (PRO-BIOTIC BLEND PO) Take 1 capsule by mouth daily.     promethazine -dextromethorphan (PROMETHAZINE -DM)  6.25-15 MG/5ML syrup Take 5 mLs by mouth 4 (four) times daily as needed for cough. (Patient not taking: Reported on 11/28/2023) 120 mL 0   zolpidem  (AMBIEN ) 5 MG tablet TAKE 1 TABLET(5 MG) BY MOUTH AT BEDTIME 90 tablet 0   albuterol  (VENTOLIN ) 2 MG/5ML syrup Take 7.5 mLs (3 mg total) by mouth in the morning and at bedtime. prn (Patient not taking: Reported on 11/28/2023) 60 mL 0   benzonatate  (TESSALON ) 200 MG capsule Take 1 capsule (200 mg total) by mouth 3 (three) times daily as needed for cough. (Patient not taking: Reported on 11/22/2023) 30 capsule 0   Misc Natural Products (ELDERBERRY ZINC/VIT C/IMMUNE MT) Take 1 tablet by mouth daily.     No current facility-administered medications on file prior to visit.    Objective: BP 124/72   Pulse 68   Temp 99.7 F (37.6 C)   Wt 145 lb (65.8 kg)   SpO2 97%   BMI 25.69 kg/m    General: somewhat Ill-appearing, well-developed, well-nourished Skin: warm, dry HEENT: pharynx with mild erythema, no exudates, otherwise HENT unremarkable Neck: Supple, non tender, shotty cervical adenopathy Heart: Regular rate and rhythm, normal S1, S2, no murmurs Lungs: Clear to auscultation bilaterally, no wheezes, rales, rhonchi Extremities: Mild generalized tenderness   Assessment: Encounter Diagnoses  Name Primary?   Acute cough Yes  Fever, unspecified fever cause    COVID     Plan: COVID-positive.  We discussed symptoms, treatment recommendations.  Advised rest, continue good hydration, continue Tylenol  as needed for fever and malaise.  She can use the vitamin pack they have at home with Vit C, zinc and vitamin D .  We discussed Paxlovid , risks, benefits, treatment.  Cough syrup below as needed  We discussed symptoms that would prompt urgent recheck  Discussed period of quarantine.    Kani was seen today for acute visit.  Diagnoses and all orders for this visit:  Acute cough -     POC COVID-19 -     POCT Influenza A/B  Fever,  unspecified fever cause -     POC COVID-19 -     POCT Influenza A/B  COVID  Other orders -     promethazine -dextromethorphan (PROMETHAZINE -DM) 6.25-15 MG/5ML syrup; Take 5 mLs by mouth 4 (four) times daily as needed for cough. -     nirmatrelvir/ritonavir (PAXLOVID , 300/100,) 20 x 150 MG & 10 x 100MG  TBPK; Take 3 tablets by mouth in the morning and at bedtime for 5 days.    F/u prn

## 2024-01-02 ENCOUNTER — Ambulatory Visit

## 2024-01-02 DIAGNOSIS — Z23 Encounter for immunization: Secondary | ICD-10-CM

## 2024-01-05 ENCOUNTER — Ambulatory Visit: Payer: Self-pay

## 2024-01-05 NOTE — Telephone Encounter (Signed)
 FYI Only or Action Required?: FYI only for provider: appointment scheduled on 10/31.  Patient was last seen in primary care on 11/28/2023 by Bulah Alm RAMAN, PA-C.  Called Nurse Triage reporting Sore Throat.  Symptoms began several days ago.  Interventions attempted: Rest, hydration, or home remedies.  Symptoms are: gradually worsening.  Triage Disposition: See PCP When Office is Open (Within 3 Days)  Patient/caregiver understands and will follow disposition?: Yes  Copied from CRM #[id number]. Topic: Clinical - Red Word Triage >> Jan 05, 2024 11:14 AM Charlet HERO wrote: Red Word that prompted transfer to Nurse Triage: Patient is stating that she has pain from her throat to her ear and it is also spasm in her throat that cause her to cough. Lanlonde. Reason for Disposition  [1] Sore throat with cough/cold symptoms AND [2] present > 5 days  Answer Assessment - Initial Assessment Questions For the past few days patient has had sore, burning throat, mild non productive coughing makes it worse. Shooting pain up to her ear when she swallows. Denies exposure to strep, fever, CP, SOB, Dizziness. 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 ED/UC/Call back instructions given and understood.   1. ONSET: When did the throat start hurting? (Hours or days ago)      A couple days 2. SEVERITY: How bad is the sore throat? (Scale 1-10; mild, moderate or severe)     Close to a 10/10 when it hits but not constant  3. STREP EXPOSURE: Has there been any exposure to strep within the past week? If Yes, ask: What type of contact occurred?      denies 4.  VIRAL SYMPTOMS: Are there any symptoms of a cold, such as a runny nose, cough, hoarse voice or red eyes?      Mild cough  5. FEVER: Do you have a fever? If Yes, ask: What is your temperature, how was it measured, and when did it start?     denies 6. PUS ON THE TONSILS: Is there pus  on the tonsils in the back of your throat?     denies 7. OTHER SYMPTOMS: Do you have any other symptoms? (e.g., difficulty breathing, headache, rash)     Mild cough, pain refers to ear.  Protocols used: Sore Throat-A-AH

## 2024-01-06 ENCOUNTER — Ambulatory Visit (INDEPENDENT_AMBULATORY_CARE_PROVIDER_SITE_OTHER): Admitting: Medical

## 2024-01-06 VITALS — BP 120/68 | HR 68 | Temp 98.3°F | Wt 146.8 lb

## 2024-01-06 DIAGNOSIS — J029 Acute pharyngitis, unspecified: Secondary | ICD-10-CM

## 2024-01-06 DIAGNOSIS — H6992 Unspecified Eustachian tube disorder, left ear: Secondary | ICD-10-CM

## 2024-01-06 MED ORDER — OXYMETAZOLINE HCL 0.05 % NA SOLN: 1.0000 | Freq: Two times a day (BID) | NASAL | 0 refills | Status: DC

## 2024-01-06 MED ORDER — BENZONATATE 200 MG PO CAPS: 200.0000 mg | ORAL_CAPSULE | Freq: Three times a day (TID) | ORAL | 0 refills | Status: DC | PRN

## 2024-01-06 MED ORDER — CHLORPHENIRAMINE-PHENYLEPHRINE 4-10 MG PO TABS: 1.0000 | ORAL_TABLET | Freq: Three times a day (TID) | ORAL | 0 refills | Status: DC

## 2024-01-06 NOTE — Patient Instructions (Addendum)
 Symptoms suggest eustachian tube dysfunction, inner ear congestion and drainage causing irration of the throat   Recommendations: Begin Chlorpheniramine-Phenylephrine (Sudafed PE), 1 tablet twice daily or 3 times daily for the next 5-7 days.  This will help decongestant the inner ear This medication is over the counter, but I am sending as a prescription in the event insurance will pay for it Consider over the counter Afrin nasal spray once or twice daily only for 3 days or less  Use salt water gargles 3 times daily and warm fluids to help with throat irritation Drink at least 50-60 ounces of water daily Use a humidifier or hot steamy shower which can help

## 2024-01-06 NOTE — Progress Notes (Signed)
 Subjective:  Anna Mckinney is a 73 y.o. female who presents for Chief Complaint  Patient presents with   Acute Visit    Cough, throat hurts and pain goes up to ear     Anna Mckinney is a 73 year old female who presents with throat pain radiating to the ear.  She has been experiencing a burning sensation in her throat when swallowing, which radiates to her left ear and causes it to throb. This has been ongoing for three to four days. The pain is severe and leads to coughing and gagging episodes. No fever, nausea, vomiting, body aches, chills, or significant cough with yellow or green sputum.  She has been managing her symptoms with Tylenol  and salt water gargles. She drinks a lot of water daily and consumes two cups of coffee. She has a history of allergies to codeine, doxycycline, oxycodone , and hydrocodone, which cause nausea.  No other aggravating or relieving factors.    No other c/o.  The following portions of the patient's history were reviewed and updated as appropriate: allergies, current medications, past family history, past medical history, past social history, past surgical history and problem list.  ROS Otherwise as in subjective above   Objective: BP 120/68   Pulse 68   Temp 98.3 F (36.8 C)   Wt 146 lb 12.8 oz (66.6 kg)   SpO2 98%   BMI 26.00 kg/m   General appearance: alert, no distress, well developed, well nourished HEENT: normocephalic, sclerae anicteric, conjunctiva pink and moist, TMs pearly, nares patent, no discharge or erythema, pharynx normal, no TMJ tendnerss Oral cavity: MMM, no lesions Neck: supple, nontender, no lymphadenopathy, no thyromegaly, no masses, normal ROM Heart: RRR, normal S1, S2, no murmurs Lungs: CTA bilaterally, no wheezes, rhonchi, or rales Pulses: 2+ radial pulses, 2+ pedal pulses, normal cap refill Ext: no edema   Assessment: Encounter Diagnoses  Name Primary?   Eustachian tube dysfunction, left Yes   Sore throat       Plan: Exam essentially normal  Symptoms suggest eustachian tube dysfunction, inner ear congestion and drainage causing irration of the throat  Recommendations: Begin Chlorpheniramine-Phenylephrine (Sudafed PE), 1 tablet twice daily or 3 times daily for the next 5-7 days.  This will help decongestant the inner ear This medication is over the counter, but I am sending as a prescription in the event insurance will pay for it Consider over the counter Afrin nasal spray once or twice daily only for 3 days or less  Use salt water gargles 3 times daily and warm fluids to help with throat irritation Drink at least 50-60 ounces of water daily Use a humidifier or hot steamy shower which can help  Veeda was seen today for acute visit.  Diagnoses and all orders for this visit:  Eustachian tube dysfunction, left  Sore throat  Other orders -     Chlorpheniramine-Phenylephrine 4-10 MG tablet; Take 1 tablet by mouth every 8 (eight) hours. -     oxymetazoline (AFRIN NASAL SPRAY) 0.05 % nasal spray; Place 1 spray into both nostrils 2 (two) times daily. 3 days or less use -     benzonatate  (TESSALON ) 200 MG capsule; Take 1 capsule (200 mg total) by mouth 3 (three) times daily as needed for cough.    Follow up: prn

## 2024-01-11 ENCOUNTER — Ambulatory Visit: Payer: PPO | Admitting: Family Medicine

## 2024-01-11 ENCOUNTER — Encounter: Payer: Self-pay | Admitting: Family Medicine

## 2024-01-11 VITALS — BP 120/70 | HR 63 | Ht 62.0 in | Wt 145.6 lb

## 2024-01-11 DIAGNOSIS — G4700 Insomnia, unspecified: Secondary | ICD-10-CM | POA: Diagnosis not present

## 2024-01-11 DIAGNOSIS — R6882 Decreased libido: Secondary | ICD-10-CM | POA: Diagnosis not present

## 2024-01-11 DIAGNOSIS — Z0000 Encounter for general adult medical examination without abnormal findings: Secondary | ICD-10-CM | POA: Diagnosis not present

## 2024-01-11 LAB — LIPID PANEL

## 2024-01-11 MED ORDER — ZOLPIDEM TARTRATE 10 MG PO TABS: 10.0000 mg | ORAL_TABLET | Freq: Every evening | ORAL | 2 refills | Status: AC | PRN

## 2024-01-11 NOTE — Progress Notes (Signed)
 Name: Anna Mckinney   Date of Visit: 01/11/24   Date of last visit with me: Visit date not found   CHIEF COMPLAINT:  Chief Complaint  Patient presents with   Annual Exam    Cpe. Wants to talk having no sex drive.        HPI:  Discussed the use of AI scribe software for clinical note transcription with the patient, who gave verbal consent to proceed.  History of Present Illness   Anna Mckinney is a 73 year old female who presents with concerns about decreased libido and sleep disturbances.  She experiences a significant decrease in libido, describing it as 'like it's dead.' She has a history of hysterectomy at age 43 and is concerned about using treatments that might increase cancer risk, particularly estrogen replacement therapy, due to her family history of breast cancer. Her mother had breast cancer, diagnosed late, but she maintains regular mammograms. No vaginal dryness or pain during intercourse, as her husband uses a cream. She experiences hot flashes, which she associates with menopause, and is concerned about the impact of her symptoms on her sexual health.  She experiences sleep disturbances, exacerbated by caring for her daughter with Angelman syndrome, who has a sleep disorder. She currently takes 5 mg of Ambien  nightly but feels it is insufficient, as she often has to wake up to care for her daughter. She occasionally uses Tylenol  for back pain related to lifting her daughter.         OBJECTIVE:       01/11/2024   11:11 AM  Depression screen PHQ 2/9  Decreased Interest 0  Down, Depressed, Hopeless 0  PHQ - 2 Score 0     BP Readings from Last 3 Encounters:  01/11/24 120/70  01/06/24 120/68  11/28/23 124/72    BP 120/70   Pulse 63   Ht 5' 2 (1.575 m)   Wt 145 lb 9.6 oz (66 kg)   SpO2 97%   BMI 26.63 kg/m    Physical Exam          Physical Exam Constitutional:      Appearance: Normal appearance.  Cardiovascular:     Rate and  Rhythm: Normal rate.  Neurological:     General: No focal deficit present.     Mental Status: She is alert and oriented to person, place, and time. Mental status is at baseline.     ASSESSMENT/PLAN:   Assessment & Plan Routine general medical examination at a health care facility  Insomnia, unspecified type  Decreased libido without sexual dysfunction    Assessment and Plan    Adult Wellness Visit Visit focused on addressing specific health concerns, including insomnia and decreased libido, with discussion of treatment options. Regular mammograms maintained despite family history of breast cancer. - Performed basic labs. - Administer shingles vaccine on Monday or Thursday. -Comprehensive annual physical exam completed today. Reviewed interval history, current medical issues, medications, allergies, and preventive care needs. Addressed all patient questions and concerns. Discussed lifestyle factors including diet, exercise, sleep, and stress management. Reviewed recommended age-appropriate screenings, labs, and vaccinations. Counseling provided on healthy habits and routine health maintenance. Follow-up as indicated based on findings and results.   Insomnia Chronic insomnia worsened by caregiving. Current Ambien  5 mg insufficient. - Prescribed Ambien  10 mg nightly as needed. - Follow-up in two weeks to assess response.  Decreased libido Postmenopausal decreased libido without vaginal dryness or pain. Concerns about estrogen therapy due to family history  of breast cancer. Discussed testosterone  gel, not approved in [place]  for this indication. - Follow-up in two weeks to discuss further options and treatments.         Myron Stankovich A. Vita MD [facility]

## 2024-01-12 ENCOUNTER — Ambulatory Visit: Payer: Self-pay | Admitting: Family Medicine

## 2024-01-12 LAB — CBC WITH DIFFERENTIAL/PLATELET
Basophils Absolute: 0 x10E3/uL (ref 0.0–0.2)
Basos: 1 %
EOS (ABSOLUTE): 0.1 x10E3/uL (ref 0.0–0.4)
Eos: 2 %
Hematocrit: 44.8 % (ref 34.0–46.6)
Hemoglobin: 15.4 g/dL (ref 11.1–15.9)
Immature Grans (Abs): 0 x10E3/uL (ref 0.0–0.1)
Immature Granulocytes: 0 %
Lymphocytes Absolute: 2 x10E3/uL (ref 0.7–3.1)
Lymphs: 36 %
MCH: 29.7 pg (ref 26.6–33.0)
MCHC: 34.4 g/dL (ref 31.5–35.7)
MCV: 86 fL (ref 79–97)
Monocytes Absolute: 0.5 x10E3/uL (ref 0.1–0.9)
Monocytes: 9 %
Neutrophils Absolute: 2.9 x10E3/uL (ref 1.4–7.0)
Neutrophils: 52 %
Platelets: 252 x10E3/uL (ref 150–450)
RBC: 5.19 x10E6/uL (ref 3.77–5.28)
RDW: 13.5 % (ref 11.7–15.4)
WBC: 5.5 x10E3/uL (ref 3.4–10.8)

## 2024-01-12 LAB — COMPREHENSIVE METABOLIC PANEL WITH GFR
ALT: 20 IU/L (ref 0–32)
AST: 22 IU/L (ref 0–40)
Albumin: 4.3 g/dL (ref 3.8–4.8)
Alkaline Phosphatase: 82 IU/L (ref 49–135)
BUN/Creatinine Ratio: 20 (ref 12–28)
BUN: 14 mg/dL (ref 8–27)
Bilirubin Total: 0.5 mg/dL (ref 0.0–1.2)
CO2: 23 mmol/L (ref 20–29)
Calcium: 10.7 mg/dL — AB (ref 8.7–10.3)
Chloride: 103 mmol/L (ref 96–106)
Creatinine, Ser: 0.69 mg/dL (ref 0.57–1.00)
Globulin, Total: 2.4 g/dL (ref 1.5–4.5)
Glucose: 100 mg/dL — AB (ref 70–99)
Potassium: 4.2 mmol/L (ref 3.5–5.2)
Sodium: 139 mmol/L (ref 134–144)
Total Protein: 6.7 g/dL (ref 6.0–8.5)
eGFR: 92 mL/min/1.73 (ref >59)

## 2024-01-12 LAB — LIPID PANEL
Cholesterol, Total: 222 mg/dL — AB (ref 100–199)
HDL: 37 mg/dL — AB (ref >39)
LDL CALC COMMENT:: 6 ratio — AB (ref 0.0–4.4)
LDL Chol Calc (NIH): 145 mg/dL — AB (ref 0–99)
Triglycerides: 219 mg/dL — AB (ref 0–149)
VLDL Cholesterol Cal: 40 mg/dL (ref 5–40)

## 2024-01-31 ENCOUNTER — Telehealth: Payer: Self-pay | Admitting: *Deleted

## 2024-01-31 ENCOUNTER — Ambulatory Visit (INDEPENDENT_AMBULATORY_CARE_PROVIDER_SITE_OTHER): Admitting: Family Medicine

## 2024-01-31 ENCOUNTER — Encounter: Payer: Self-pay | Admitting: Family Medicine

## 2024-01-31 VITALS — BP 132/82 | HR 84 | Temp 99.6°F | Ht 62.0 in | Wt 146.4 lb

## 2024-01-31 DIAGNOSIS — R051 Acute cough: Secondary | ICD-10-CM | POA: Diagnosis not present

## 2024-01-31 DIAGNOSIS — J029 Acute pharyngitis, unspecified: Secondary | ICD-10-CM | POA: Diagnosis not present

## 2024-01-31 DIAGNOSIS — R6882 Decreased libido: Secondary | ICD-10-CM

## 2024-01-31 LAB — POCT INFLUENZA A/B
Influenza A, POC: NEGATIVE
Influenza B, POC: NEGATIVE

## 2024-01-31 LAB — POC COVID19 BINAXNOW: SARS Coronavirus 2 Ag: NEGATIVE

## 2024-01-31 MED ORDER — METHYLPREDNISOLONE 4 MG PO TBPK: ORAL_TABLET | ORAL | 0 refills | Status: DC

## 2024-01-31 MED ORDER — AMBULATORY NON FORMULARY MEDICATION: 2 refills | Status: AC

## 2024-01-31 NOTE — Telephone Encounter (Signed)
 Copied from CRM [id number]. Topic: Clinical - Prescription Issue >> Jan 31, 2024  2:26 PM Teressa P wrote: Reason for CRM: patient came into the office this morning and was supposed to be prescribed a Medrol  Dosepak.  It was not sent to the pharmacy yet.  [facility] on [address]

## 2024-01-31 NOTE — Progress Notes (Signed)
   Name: Kalanie Fewell   Date of Visit: 01/31/24   Date of last visit with me: 01/11/2024   CHIEF COMPLAINT:  Chief Complaint  Patient presents with   Follow-up    2 week follow up. Patient is here and wants to discuss libido issues. Also started Sunday with some PDN and burning in her throat that is causing her to cough when it burns.        HPI:  Discussed the use of AI scribe software for clinical note transcription with the patient, who gave verbal consent to proceed.  History of Present Illness   Houda Brady Strnad is a 73 year old female who presents with a sore throat and nasal congestion.  Her sore throat began on Sunday afternoon, characterized by a burning sensation localized to the front of her throat, which leads to coughing. There is no sensation of reflux.  She also experiences nasal congestion, feeling stopped up. Her flu and COVID tests are negative.  She has been having low libido and complains that she would like to have relations with her husband. Patient would like to try some medications. Patient would like to avoid estrogen and try something else if possible.         OBJECTIVE:       11 /07/2023   11:11 AM  Depression screen PHQ 2/9  Decreased Interest 0  Down, Depressed, Hopeless 0  PHQ - 2 Score 0     BP Readings from Last 3 Encounters:  01/31/24 132/82  01/11/24 120/70  01/06/24 120/68    BP 132/82   Pulse 84   Temp 99.6 F (37.6 C) (Tympanic)   Ht 5' 2 (1.575 m)   Wt 146 lb 6.4 oz (66.4 kg)   BMI 26.78 kg/m    Physical Exam          Physical Exam Constitutional:      Appearance: Normal appearance.  HENT:     Right Ear: Tympanic membrane normal.     Left Ear: Tympanic membrane normal.     Nose: Congestion and rhinorrhea present.     Mouth/Throat:     Pharynx: Posterior oropharyngeal erythema present. No oropharyngeal exudate.  Neurological:     General: No focal deficit present.     Mental Status: She is alert and  oriented to person, place, and time. Mental status is at baseline.     ASSESSMENT/PLAN:   Assessment & Plan Acute cough  Sore throat  Low libido    Assessment and Plan    Acute pharyngitis with acute cough Symptoms suggestive of viral etiology. Negative for flu and COVID. - Prescribed Medrol  Dosepak. - Patient would prefer a steroid pack at this time as opposed to conservative management.   Decreased libido Discussed low-dose testosterone  cream to increase libido. Explained application method, potential benefits, and minimal side effects. Agreed to treatment. - Prescribed low-dose testosterone  cream for application to thighs on Monday, Wednesday, and Friday. - Instructed to apply cream after showering or when area is dry. - Scheduled follow-up in three months to assess effectiveness and check hormone levels.         Erabella Kuipers A. Vita MD [facility]

## 2024-01-31 NOTE — Patient Instructions (Signed)
Please pick

## 2024-02-01 ENCOUNTER — Ambulatory Visit: Admitting: Family Medicine

## 2024-02-06 ENCOUNTER — Ambulatory Visit: Payer: Self-pay

## 2024-02-06 NOTE — Telephone Encounter (Signed)
 FYI Only or Action Required?: Action required by provider: clinical question for provider, update on patient condition, and requesting medications for symptoms-still isn'Mckinney feeling better.  Patient was last seen in primary care on 01/31/2024 by Vita Morrow, MD.  Called Nurse Triage reporting Cough.  Symptoms began a week ago.  Interventions attempted: Rest, hydration, or home remedies.  Symptoms are: unchanged.  Triage Disposition: See Physician Within 24 Hours  Patient/caregiver understands and will follow disposition?: No, wishes to speak with PCP  Copied from CRM #[id number]. Topic: Clinical - Red Word Triage >> Feb 06, 2024 10:43 AM Anna Mckinney wrote: [facility] that prompted transfer to Nurse Triage: Pt calling states she has a continued productive cough, when blowing nose, with colored thick green mucous.  Pt reports was prescribed prednisone , and has completed medication, however, she is not feeling any better.   Pt requesting another medication like an antibiotic, to help with ongoing symptoms, of cough and possible infection, or another appt for evaluation. Reason for Disposition  SEVERE coughing spells (e.g., whooping sound after coughing, vomiting after coughing)  Answer Assessment - Initial Assessment Questions Patient reports productive cough and congestion. Was seen last week in the office for same symptoms. Patient states she is having thick green sputum with her cough. Patient is asking for an antibiotic and other medications to help with her symptoms. Requesting a call back from the office.   1. ONSET: When did the cough begin?      Started over a week ago 2. SEVERITY: How bad is the cough today?      Moderate to severe 3. SPUTUM: Describe the color of your sputum (e.g., none, dry cough; clear, white, yellow, green)     Thick green sputum 4. HEMOPTYSIS: Are you coughing up any blood? If Yes, ask: How much? (e.g., flecks, streaks, tablespoons, etc.)     no 5.  DIFFICULTY BREATHING: Are you having difficulty breathing? If Yes, ask: How bad is it? (e.g., mild, moderate, severe)      no 6. FEVER: Do you have a fever? If Yes, ask: What is your temperature, how was it measured, and when did it start?     no 7. CARDIAC HISTORY: Do you have any history of heart disease? (e.g., heart attack, congestive heart failure)      no 8. LUNG HISTORY: Do you have any history of lung disease?  (e.g., pulmonary embolus, asthma, emphysema)     no 9. PE RISK FACTORS: Do you have a history of blood clots? (or: recent major surgery, recent prolonged travel, bedridden)     no 10. OTHER SYMPTOMS: Do you have any other symptoms? (e.g., runny nose, wheezing, chest pain)       congestion 12. TRAVEL: Have you traveled out of the country in the last month? (e.g., travel history, exposures)       no  Protocols used: Cough - Acute Productive-A-AH

## 2024-02-06 NOTE — Telephone Encounter (Signed)
 Reason for CRM: Patient is calling back to follow up on the request for medication from Nurse Triage note from today - advised that Dr. Vita will be sending in Augmentin  twice daily for 7 days. Patient is requesting medicaiton to be sent in prior to 6pm [id number] [place], [place] - [address] AT [facility] [address] [place]  [address] Phone: [phone] Fax: [phone] Hours: Not open 24 hours

## 2024-02-07 ENCOUNTER — Other Ambulatory Visit: Payer: Self-pay | Admitting: *Deleted

## 2024-02-07 MED ORDER — AMOXICILLIN-POT CLAVULANATE 875-125 MG PO TABS: 1.0000 | ORAL_TABLET | Freq: Two times a day (BID) | ORAL | 0 refills | Status: DC

## 2024-03-02 ENCOUNTER — Telehealth: Admitting: Nurse Practitioner

## 2024-03-02 ENCOUNTER — Ambulatory Visit: Payer: Self-pay

## 2024-03-02 VITALS — Temp 103.0°F

## 2024-03-02 DIAGNOSIS — R110 Nausea: Secondary | ICD-10-CM

## 2024-03-02 DIAGNOSIS — R509 Fever, unspecified: Secondary | ICD-10-CM | POA: Diagnosis not present

## 2024-03-02 DIAGNOSIS — R051 Acute cough: Secondary | ICD-10-CM

## 2024-03-02 DIAGNOSIS — J111 Influenza due to unidentified influenza virus with other respiratory manifestations: Secondary | ICD-10-CM

## 2024-03-02 MED ORDER — PREDNISONE 20 MG PO TABS: 20.0000 mg | ORAL_TABLET | Freq: Every day | ORAL | 0 refills | Status: AC

## 2024-03-02 MED ORDER — OSELTAMIVIR PHOSPHATE 75 MG PO CAPS: 75.0000 mg | ORAL_CAPSULE | Freq: Two times a day (BID) | ORAL | 0 refills | Status: AC

## 2024-03-02 MED ORDER — PROMETHAZINE-DM 6.25-15 MG/5ML PO SYRP: 5.0000 mL | ORAL_SOLUTION | Freq: Four times a day (QID) | ORAL | 0 refills | Status: AC | PRN

## 2024-03-02 NOTE — Telephone Encounter (Signed)
 FYI Only or Action Required?: FYI only for provider: appointment scheduled on 12.26.25.  Patient was last seen in primary care on 01/31/2024 by Vita Morrow, MD.  Called Nurse Triage reporting URI.  Symptoms began yesterday.  Interventions attempted: OTC medications: Tylneol.  Symptoms are: stable.  Triage Disposition: See HCP Within 4 Hours (Or PCP Triage)  Patient/caregiver understands and will follow disposition?: Yes   Copied from CRM #[id number]. Topic: Clinical - Red Word Triage >> Mar 02, 2024 11:05 AM Amy B wrote: Red Word that prompted transfer to Nurse Triage: Cough, vomiting, fever 102, weakness, stomach pain Reason for Disposition  [1] Fever > 101 F (38.3 C) AND [2] age > 60 years  Answer Assessment - Initial Assessment Questions 1. ONSET: When did the nasal discharge start?      Yesterday   2. COUGH: Do you have a cough? If Yes, ask: Describe the color of your mucus. (e.g., clear, white, yellow, green)      Severe coughing spells   3. FEVER: Do you have a fever? If Yes, ask: What is your temperature, how was it measured, and when did it start?     102.0 F, Treating with Tylenol   4. SEVERITY: Overall, how bad are you feeling right now? (e.g., doesn't interfere with normal activities, staying home from school/work, staying in bed)       Cough causing weakness  5. OTHER SYMPTOMS: Do you have any other symptoms? (e.g., earache, mouth sores, sore throat, wheezing)       Abdominal pain due to cough    Pt reports Cough/common cold Pt taking OTC Tylenol  Pt scheduled for a visit on 12.26.25 for further evaluation. Offered in clinic appt, however, pt states she is too weak to drive. Pt agrees with plan of care, will call back for any worsening symptoms  Protocols used: Common Cold-A-AH

## 2024-03-02 NOTE — Progress Notes (Signed)
 Virtual Visit Encounter mychart visit. Patient unable to connect. Phone visit completed.    I connected with  Anna Mckinney on 03/02/2024 at  2:15 PM EST by secure video and audio telemedicine application. I verified that I am speaking with the correct person using two identifiers.   I introduced myself as a Publishing Rights Manager with the practice. The limitations of evaluation and management by telemedicine discussed with the patient and the availability of in person appointments. The patient expressed verbal understanding and consent to proceed.  Participating parties in this visit include: Myself and patient  The patient is: Patient Location: Home I am: Provider Location: Office/Clinic Subjective:    CC and HPI:  History of Present Illness Anna Mckinney is a 73 year old female who presents with symptoms of cough, fever, weakness, body aches, decreased appetite, and nausea.  She has been experiencing a cough, fever, nausea, and body aches since Wednesday night. Her fever has reached up to 103F (today). She also has a dry throat, weakness, and stomach pain when coughing. She has not tested for COVID-19 or flu. She has not had any known sick contacts. She reports that she feels too weak to drive here for testing at this time, which is understandable.   She has been taking Tylenol , two tablets at a time, which she believes is helping with her fever and body aches. She has no appetite but is drinking a lot of water, stating she only wants water. She has not been consuming coffee or other beverages.  She recalls having a persistent cough since late November for which she was treated with a steroid dose pack and then antibiotics. She reports the cough never fully resolved, but improved and since the onset of these symptoms has worsened significantly. No sore throat is present, and no one else in her household is experiencing similar symptoms.   Ibuprofen causes stomach discomfort, so  she has been avoiding it. She reports that tessalon  pearls did not help her cough. She has allergies to codeine.   Past medical history, Surgical history, Family history not pertinant except as noted below, Social history, Allergies, and medications have been entered into the medical record, reviewed, and corrections made.   Review of Systems:  All review of systems negative except what is listed in the HPI  Objective:    Alert and oriented x 4 Speaking in clear sentences with audible nasal congestion Productive cough with audible retained secretions present. Frequently coughing No respiratory distress detected  Impression and Recommendations:    Assessment & Plan Influenza-like illness Acute onset of symptoms including cough, fever (up to 103F), nausea, dry throat, body aches, and weakness since Wednesday night. Differential diagnosis includes influenza, Pna, and other viral illnesses. Considered secondary pneumonia, but this is a stretch given the time frame for onset from previous illness. Given the current prevalence of flu A in the community and limited time frame for treatment, treatment for influenza is warranted. No exposure to known flu or COVID-19 cases. Symptoms likely exacerbated by recent URI and (improving) cough already present. Her previous illness was improving after antibiotic treatment, providing reassurance against secondary pneumonia, but still retain a low threshold for treatment with antibiotics again if symptoms are persistent. Also recommend CXR if no improvement. . Will attempt treatment with antiviral initially. Information on emergency evaluation over the weekend provided. If symptoms not improving (but no emergency symptoms) by Monday, recommend in person evaluation.  - Prescribed Tamiflu  to address potential influenza infection. -  Prescribed cough syrup containing promethazine  to suppress cough and aid in expectoration. - Continue Tylenol  for fever and body aches,  taking two tablets every 6-8 hours. - Prescribed a short course of prednisone  to reduce lung inflammation. - Advised increased fluid intake, particularly water, and consider tea to thin secretions. - Recommended rest  Orders:   promethazine -dextromethorphan (PROMETHAZINE -DM) 6.25-15 MG/5ML syrup; Take 5 mLs by mouth 4 (four) times daily as needed for cough.   oseltamivir  (TAMIFLU ) 75 MG capsule; Take 1 capsule (75 mg total) by mouth 2 (two) times daily. Take BID for 5 days.  Take with food.   predniSONE  (DELTASONE ) 20 MG tablet; Take 1 tablet (20 mg total) by mouth daily with breakfast.    orders and follow up as documented in EMR I discussed the assessment and treatment plan with the patient. The patient was provided an opportunity to ask questions and all were answered. The patient agreed with the plan and demonstrated an understanding of the instructions.   The patient was advised to call back or seek an in-person evaluation if the symptoms worsen or if the condition fails to improve as anticipated.  Follow-Up: As needed for ongoing or worsening symptoms following treatment  I provided 20 minutes of non-face-to-face interaction with this non face-to-face encounter including intake, same-day documentation, and chart review.   Camie CHARLENA Doing, NP , DNP, AGNP-c [facility]

## 2024-03-02 NOTE — Patient Instructions (Signed)
 If you begin to have worsening symptoms, respiratory distress (feel like you can't breathe), or no improvement over the weekend please seek emergency evaluation.  If symptoms are not distressing, but not improving by Monday, please call the office for an in person evaluation.   Keep drinking fluids. You may want to try broth or soups to help with strength. Don't push food until you feel ready. Stay away from dairy sources, these can make the cough worse.  Rest as much as possible. This is very important for your body to heal.   Take 1000mg  tylenol  every 6-8 hours for your fever and body aches.

## 2024-03-09 ENCOUNTER — Encounter: Payer: Self-pay | Admitting: Family Medicine

## 2024-03-09 ENCOUNTER — Ambulatory Visit: Admitting: Family Medicine

## 2024-03-09 VITALS — BP 138/80 | HR 79 | Temp 99.1°F | Ht 64.0 in | Wt 144.6 lb

## 2024-03-09 DIAGNOSIS — R051 Acute cough: Secondary | ICD-10-CM

## 2024-03-09 DIAGNOSIS — J069 Acute upper respiratory infection, unspecified: Secondary | ICD-10-CM

## 2024-03-09 DIAGNOSIS — R058 Other specified cough: Secondary | ICD-10-CM | POA: Diagnosis not present

## 2024-03-09 DIAGNOSIS — R0982 Postnasal drip: Secondary | ICD-10-CM | POA: Diagnosis not present

## 2024-03-09 LAB — POC COVID19/FLU A&B COMBO
Covid Antigen, POC: NEGATIVE
Influenza A Antigen, POC: NEGATIVE
Influenza B Antigen, POC: NEGATIVE

## 2024-03-09 MED ORDER — AMOXICILLIN-POT CLAVULANATE 875-125 MG PO TABS: 1.0000 | ORAL_TABLET | Freq: Two times a day (BID) | ORAL | 0 refills | Status: AC

## 2024-03-09 MED ORDER — FLUTICASONE PROPIONATE 50 MCG/ACT NA SUSP: 2.0000 | Freq: Every day | NASAL | 6 refills | Status: AC

## 2024-03-09 NOTE — Progress Notes (Signed)
" ° °  Name: Anna Mckinney   Date of Visit: 03/09/2024   Date of last visit with me: 01/31/2024   CHIEF COMPLAINT:  Chief Complaint  Patient presents with   other    Cough, phlegm  wont go away- started on last Wednesday or Thursday-       HPI:  Discussed the use of AI scribe software for clinical note transcription with the patient, who gave verbal consent to proceed.  History of Present Illness   Anna Mckinney is a 74 year old female who presents with persistent cough and congestion.  She has been experiencing a persistent cough, described as 'all I've done is bark', along with significant postnasal drip. She feels this is the worst she has ever experienced. Despite previous treatments with Tamiflu  and prednisone , her symptoms have not improved. She describes the sensation in her throat as feeling like 'glue'.  She has been prescribed testosterone , which she has only used once due to her illness. She finds the packaging inconvenient, noting it comes in three small tubes with a very small amount of medication.  She does not have a humidifier in her room and keeps the room cool, which she mentions helps her breathe better.         OBJECTIVE:       01/11/2024   11:11 AM  Depression screen PHQ 2/9  Decreased Interest 0  Down, Depressed, Hopeless 0  PHQ - 2 Score 0     BP Readings from Last 3 Encounters:  03/09/24 138/80  01/31/24 132/82  01/11/24 120/70    BP 138/80   Pulse 79   Temp 99.1 F (37.3 C) (Tympanic)   Ht 5' 4 (1.626 m)   Wt 144 lb 9.6 oz (65.6 kg)   SpO2 96%   BMI 24.82 kg/m    Physical Exam          Physical Exam Constitutional:      Appearance: Normal appearance.  Neurological:     General: No focal deficit present.     Mental Status: She is alert and oriented to person, place, and time. Mental status is at baseline.     ASSESSMENT/PLAN:   Assessment & Plan Cough productive of purulent sputum  Viral URI with cough  Acute  cough  Post-nasal drip    Assessment and Plan    Acute bacterial sinusitis- unreoslved with prednisone  and tamiflu  Likely secondary to viral infection progressing to bacterial illness. Symptoms include postnasal drip and cough due to sinus congestion. Previous Tamiflu  and prednisone  ineffective, suggesting bacterial etiology. - Prescribed Augmentin . - Prescribed nightly nasal spray. - Advised humidifier use to alleviate dry air.   Post Nasal drip - Recommend flonase and humidifier.         Jafeth Mustin A. Vita MD [facility] "

## 2024-05-16 ENCOUNTER — Ambulatory Visit: Admitting: Family Medicine

## 2025-01-14 ENCOUNTER — Encounter: Admitting: Family Medicine
